# Patient Record
Sex: Female | Born: 1975
Health system: Southern US, Community
[De-identification: ages and names within clinical notes are randomized; demographics above are authoritative.]

## PROBLEM LIST (undated history)

## (undated) DIAGNOSIS — R51 Headache: Secondary | ICD-10-CM

## (undated) DIAGNOSIS — J45909 Unspecified asthma, uncomplicated: Secondary | ICD-10-CM

## (undated) DIAGNOSIS — N979 Female infertility, unspecified: Secondary | ICD-10-CM

## (undated) DIAGNOSIS — M069 Rheumatoid arthritis, unspecified: Secondary | ICD-10-CM

## (undated) DIAGNOSIS — R5382 Chronic fatigue, unspecified: Secondary | ICD-10-CM

## (undated) DIAGNOSIS — Z8619 Personal history of other infectious and parasitic diseases: Secondary | ICD-10-CM

## (undated) DIAGNOSIS — B379 Candidiasis, unspecified: Secondary | ICD-10-CM

## (undated) DIAGNOSIS — IMO0002 Reserved for concepts with insufficient information to code with codable children: Secondary | ICD-10-CM

## (undated) DIAGNOSIS — F329 Major depressive disorder, single episode, unspecified: Secondary | ICD-10-CM

## (undated) DIAGNOSIS — T4145XA Adverse effect of unspecified anesthetic, initial encounter: Secondary | ICD-10-CM

## (undated) DIAGNOSIS — I10 Essential (primary) hypertension: Secondary | ICD-10-CM

## (undated) DIAGNOSIS — R102 Pelvic and perineal pain: Secondary | ICD-10-CM

## (undated) DIAGNOSIS — Z8719 Personal history of other diseases of the digestive system: Secondary | ICD-10-CM

## (undated) DIAGNOSIS — D229 Melanocytic nevi, unspecified: Secondary | ICD-10-CM

## (undated) DIAGNOSIS — T8859XA Other complications of anesthesia, initial encounter: Secondary | ICD-10-CM

## (undated) DIAGNOSIS — J342 Deviated nasal septum: Secondary | ICD-10-CM

## (undated) DIAGNOSIS — Z8349 Family history of other endocrine, nutritional and metabolic diseases: Secondary | ICD-10-CM

## (undated) DIAGNOSIS — J189 Pneumonia, unspecified organism: Secondary | ICD-10-CM

## (undated) DIAGNOSIS — Z9889 Other specified postprocedural states: Secondary | ICD-10-CM

## (undated) DIAGNOSIS — I471 Supraventricular tachycardia, unspecified: Secondary | ICD-10-CM

## (undated) DIAGNOSIS — F32A Depression, unspecified: Secondary | ICD-10-CM

## (undated) DIAGNOSIS — N809 Endometriosis, unspecified: Secondary | ICD-10-CM

## (undated) DIAGNOSIS — R931 Abnormal findings on diagnostic imaging of heart and coronary circulation: Secondary | ICD-10-CM

## (undated) DIAGNOSIS — O165 Unspecified maternal hypertension, complicating the puerperium: Secondary | ICD-10-CM

## (undated) DIAGNOSIS — I493 Ventricular premature depolarization: Secondary | ICD-10-CM

## (undated) DIAGNOSIS — R011 Cardiac murmur, unspecified: Secondary | ICD-10-CM

## (undated) DIAGNOSIS — G9332 Myalgic encephalomyelitis/chronic fatigue syndrome: Secondary | ICD-10-CM

## (undated) DIAGNOSIS — K219 Gastro-esophageal reflux disease without esophagitis: Secondary | ICD-10-CM

## (undated) DIAGNOSIS — I359 Nonrheumatic aortic valve disorder, unspecified: Secondary | ICD-10-CM

## (undated) DIAGNOSIS — Z8489 Family history of other specified conditions: Secondary | ICD-10-CM

## (undated) DIAGNOSIS — R0789 Other chest pain: Secondary | ICD-10-CM

## (undated) DIAGNOSIS — R112 Nausea with vomiting, unspecified: Secondary | ICD-10-CM

## (undated) DIAGNOSIS — F419 Anxiety disorder, unspecified: Secondary | ICD-10-CM

## (undated) DIAGNOSIS — O139 Gestational [pregnancy-induced] hypertension without significant proteinuria, unspecified trimester: Secondary | ICD-10-CM

## (undated) DIAGNOSIS — N390 Urinary tract infection, site not specified: Secondary | ICD-10-CM

## (undated) DIAGNOSIS — K589 Irritable bowel syndrome without diarrhea: Secondary | ICD-10-CM

## (undated) HISTORY — DX: Personal history of other infectious and parasitic diseases: Z86.19

## (undated) HISTORY — DX: Urinary tract infection, site not specified: N39.0

## (undated) HISTORY — DX: Irritable bowel syndrome, unspecified: K58.9

## (undated) HISTORY — DX: Ventricular premature depolarization: I49.3

## (undated) HISTORY — DX: Candidiasis, unspecified: B37.9

## (undated) HISTORY — DX: Gastro-esophageal reflux disease without esophagitis: K21.9

## (undated) HISTORY — DX: Personal history of other diseases of the digestive system: Z87.19

## (undated) HISTORY — DX: Adverse effect of unspecified anesthetic, initial encounter: T41.45XA

## (undated) HISTORY — DX: Abnormal findings on diagnostic imaging of heart and coronary circulation: R93.1

## (undated) HISTORY — DX: Pelvic and perineal pain: R10.2

## (undated) HISTORY — DX: Depression, unspecified: F32.A

## (undated) HISTORY — DX: Melanocytic nevi, unspecified: D22.9

## (undated) HISTORY — DX: Endometriosis, unspecified: N80.9

## (undated) HISTORY — DX: Other chest pain: R07.89

## (undated) HISTORY — DX: Unspecified maternal hypertension, complicating the puerperium: O16.5

## (undated) HISTORY — DX: Gestational (pregnancy-induced) hypertension without significant proteinuria, unspecified trimester: O13.9

## (undated) HISTORY — DX: Essential (primary) hypertension: I10

## (undated) HISTORY — DX: Family history of other endocrine, nutritional and metabolic diseases: Z83.49

## (undated) HISTORY — DX: Supraventricular tachycardia, unspecified: I47.10

## (undated) HISTORY — DX: Reserved for concepts with insufficient information to code with codable children: IMO0002

## (undated) HISTORY — PX: FOOT SURGERY: SHX648

## (undated) HISTORY — DX: Supraventricular tachycardia: I47.1

## (undated) HISTORY — DX: Female infertility, unspecified: N97.9

## (undated) HISTORY — DX: Major depressive disorder, single episode, unspecified: F32.9

## (undated) HISTORY — PX: LAPAROSCOPIC ENDOMETRIOSIS FULGURATION: SUR769

## (undated) HISTORY — DX: Nonrheumatic aortic valve disorder, unspecified: I35.9

## (undated) HISTORY — PX: ABDOMINAL HYSTERECTOMY: SHX81

## (undated) HISTORY — DX: Deviated nasal septum: J34.2

## (undated) HISTORY — DX: Other complications of anesthesia, initial encounter: T88.59XA

---
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
## 1996-11-29 HISTORY — PX: BUNIONECTOMY: SHX129

## 1999-02-04 ENCOUNTER — Other Ambulatory Visit: Admission: RE | Admit: 1999-02-04 | Discharge: 1999-02-04 | Payer: Self-pay | Admitting: Obstetrics and Gynecology

## 1999-07-07 ENCOUNTER — Other Ambulatory Visit: Admission: RE | Admit: 1999-07-07 | Discharge: 1999-07-07 | Payer: Self-pay | Admitting: Obstetrics and Gynecology

## 1999-12-25 ENCOUNTER — Encounter: Payer: Self-pay | Admitting: Gastroenterology

## 1999-12-28 ENCOUNTER — Other Ambulatory Visit: Admission: RE | Admit: 1999-12-28 | Discharge: 1999-12-28 | Payer: Self-pay | Admitting: Internal Medicine

## 1999-12-28 ENCOUNTER — Encounter (INDEPENDENT_AMBULATORY_CARE_PROVIDER_SITE_OTHER): Payer: Self-pay | Admitting: Specialist

## 2000-01-18 ENCOUNTER — Other Ambulatory Visit: Admission: RE | Admit: 2000-01-18 | Discharge: 2000-01-18 | Payer: Self-pay | Admitting: Obstetrics and Gynecology

## 2000-03-23 ENCOUNTER — Encounter: Payer: Self-pay | Admitting: Family Medicine

## 2000-03-23 ENCOUNTER — Ambulatory Visit (HOSPITAL_COMMUNITY): Admission: RE | Admit: 2000-03-23 | Discharge: 2000-03-23 | Payer: Self-pay | Admitting: Family Medicine

## 2000-08-05 ENCOUNTER — Encounter: Payer: Self-pay | Admitting: *Deleted

## 2000-08-05 ENCOUNTER — Ambulatory Visit (HOSPITAL_COMMUNITY): Admission: RE | Admit: 2000-08-05 | Discharge: 2000-08-05 | Payer: Self-pay | Admitting: *Deleted

## 2001-02-21 ENCOUNTER — Other Ambulatory Visit: Admission: RE | Admit: 2001-02-21 | Discharge: 2001-02-21 | Payer: Self-pay | Admitting: Obstetrics and Gynecology

## 2002-02-28 ENCOUNTER — Other Ambulatory Visit: Admission: RE | Admit: 2002-02-28 | Discharge: 2002-02-28 | Payer: Self-pay | Admitting: Obstetrics and Gynecology

## 2002-11-02 ENCOUNTER — Encounter: Payer: Self-pay | Admitting: *Deleted

## 2002-11-02 ENCOUNTER — Ambulatory Visit (HOSPITAL_COMMUNITY): Admission: RE | Admit: 2002-11-02 | Discharge: 2002-11-02 | Payer: Self-pay | Admitting: Internal Medicine

## 2003-03-07 ENCOUNTER — Other Ambulatory Visit: Admission: RE | Admit: 2003-03-07 | Discharge: 2003-03-07 | Payer: Self-pay | Admitting: Obstetrics and Gynecology

## 2003-12-24 DIAGNOSIS — Z8742 Personal history of other diseases of the female genital tract: Secondary | ICD-10-CM | POA: Insufficient documentation

## 2004-01-06 ENCOUNTER — Ambulatory Visit (HOSPITAL_COMMUNITY): Admission: RE | Admit: 2004-01-06 | Discharge: 2004-01-06 | Payer: Self-pay | Admitting: Obstetrics and Gynecology

## 2004-03-09 ENCOUNTER — Other Ambulatory Visit: Admission: RE | Admit: 2004-03-09 | Discharge: 2004-03-09 | Payer: Self-pay | Admitting: Obstetrics and Gynecology

## 2004-05-02 ENCOUNTER — Inpatient Hospital Stay (HOSPITAL_COMMUNITY): Admission: RE | Admit: 2004-05-02 | Discharge: 2004-05-02 | Payer: Self-pay | Admitting: Obstetrics and Gynecology

## 2004-08-24 ENCOUNTER — Ambulatory Visit (HOSPITAL_COMMUNITY): Admission: RE | Admit: 2004-08-24 | Discharge: 2004-08-24 | Payer: Self-pay | Admitting: Obstetrics and Gynecology

## 2005-01-16 ENCOUNTER — Inpatient Hospital Stay (HOSPITAL_COMMUNITY): Admission: AD | Admit: 2005-01-16 | Discharge: 2005-01-19 | Payer: Self-pay | Admitting: Obstetrics and Gynecology

## 2005-01-20 ENCOUNTER — Encounter: Admission: RE | Admit: 2005-01-20 | Discharge: 2005-02-18 | Payer: Self-pay | Admitting: Obstetrics and Gynecology

## 2005-03-01 ENCOUNTER — Other Ambulatory Visit: Admission: RE | Admit: 2005-03-01 | Discharge: 2005-03-01 | Payer: Self-pay | Admitting: Obstetrics and Gynecology

## 2006-02-07 ENCOUNTER — Ambulatory Visit (HOSPITAL_COMMUNITY): Admission: RE | Admit: 2006-02-07 | Discharge: 2006-02-07 | Payer: Self-pay | Admitting: Obstetrics and Gynecology

## 2006-04-04 ENCOUNTER — Other Ambulatory Visit: Admission: RE | Admit: 2006-04-04 | Discharge: 2006-04-04 | Payer: Self-pay | Admitting: Obstetrics and Gynecology

## 2006-08-05 ENCOUNTER — Encounter: Admission: RE | Admit: 2006-08-05 | Discharge: 2006-08-05 | Payer: Self-pay | Admitting: Family Medicine

## 2006-08-26 ENCOUNTER — Encounter: Admission: RE | Admit: 2006-08-26 | Discharge: 2006-08-26 | Payer: Self-pay | Admitting: Family Medicine

## 2006-10-06 ENCOUNTER — Encounter: Admission: RE | Admit: 2006-10-06 | Discharge: 2006-10-06 | Payer: Self-pay | Admitting: Family Medicine

## 2006-11-29 DIAGNOSIS — IMO0002 Reserved for concepts with insufficient information to code with codable children: Secondary | ICD-10-CM

## 2006-11-29 HISTORY — PX: OTHER SURGICAL HISTORY: SHX169

## 2006-11-29 HISTORY — DX: Reserved for concepts with insufficient information to code with codable children: IMO0002

## 2007-06-20 ENCOUNTER — Ambulatory Visit (HOSPITAL_COMMUNITY): Admission: RE | Admit: 2007-06-20 | Discharge: 2007-06-21 | Payer: Self-pay | Admitting: Specialist

## 2007-10-20 ENCOUNTER — Ambulatory Visit (HOSPITAL_COMMUNITY): Admission: RE | Admit: 2007-10-20 | Discharge: 2007-10-20 | Payer: Self-pay | Admitting: Family Medicine

## 2008-05-27 ENCOUNTER — Telehealth: Payer: Self-pay | Admitting: Gastroenterology

## 2008-05-29 DIAGNOSIS — G43909 Migraine, unspecified, not intractable, without status migrainosus: Secondary | ICD-10-CM | POA: Insufficient documentation

## 2008-05-29 DIAGNOSIS — K589 Irritable bowel syndrome without diarrhea: Secondary | ICD-10-CM | POA: Insufficient documentation

## 2008-05-29 DIAGNOSIS — R1013 Epigastric pain: Secondary | ICD-10-CM | POA: Insufficient documentation

## 2008-05-29 DIAGNOSIS — K5900 Constipation, unspecified: Secondary | ICD-10-CM | POA: Insufficient documentation

## 2008-05-29 DIAGNOSIS — K219 Gastro-esophageal reflux disease without esophagitis: Secondary | ICD-10-CM | POA: Insufficient documentation

## 2008-05-30 ENCOUNTER — Ambulatory Visit: Payer: Self-pay | Admitting: Gastroenterology

## 2008-05-30 DIAGNOSIS — F3289 Other specified depressive episodes: Secondary | ICD-10-CM | POA: Insufficient documentation

## 2008-05-30 DIAGNOSIS — R1310 Dysphagia, unspecified: Secondary | ICD-10-CM | POA: Insufficient documentation

## 2008-05-30 DIAGNOSIS — I059 Rheumatic mitral valve disease, unspecified: Secondary | ICD-10-CM | POA: Insufficient documentation

## 2008-05-30 DIAGNOSIS — F329 Major depressive disorder, single episode, unspecified: Secondary | ICD-10-CM | POA: Insufficient documentation

## 2008-05-30 DIAGNOSIS — K602 Anal fissure, unspecified: Secondary | ICD-10-CM | POA: Insufficient documentation

## 2008-05-30 DIAGNOSIS — Q231 Congenital insufficiency of aortic valve: Secondary | ICD-10-CM | POA: Insufficient documentation

## 2008-05-30 LAB — CONVERTED CEMR LAB
ALT: 14 units/L (ref 0–35)
AST: 16 units/L (ref 0–37)
Albumin: 3.7 g/dL (ref 3.5–5.2)
Alkaline Phosphatase: 40 units/L (ref 39–117)
Chloride: 108 meq/L (ref 96–112)
Eosinophils Absolute: 0 10*3/uL (ref 0.0–0.7)
Eosinophils Relative: 0.8 % (ref 0.0–5.0)
GFR calc non Af Amer: 90 mL/min
HCT: 37.5 % (ref 36.0–46.0)
Hemoglobin: 13.2 g/dL (ref 12.0–15.0)
Lymphocytes Relative: 44.5 % (ref 12.0–46.0)
Monocytes Absolute: 0.4 10*3/uL (ref 0.1–1.0)
Platelets: 268 10*3/uL (ref 150–400)
Sodium: 139 meq/L (ref 135–145)
TSH: 1.32 microintl units/mL (ref 0.35–5.50)
Tissue Transglutaminase Ab, IgA: 0.2 units (ref <7)
Total Protein: 6.1 g/dL (ref 6.0–8.3)

## 2008-06-11 ENCOUNTER — Telehealth: Payer: Self-pay | Admitting: Gastroenterology

## 2008-06-14 ENCOUNTER — Encounter: Payer: Self-pay | Admitting: Gastroenterology

## 2008-06-14 ENCOUNTER — Ambulatory Visit: Payer: Self-pay | Admitting: Gastroenterology

## 2008-06-19 ENCOUNTER — Encounter: Payer: Self-pay | Admitting: Gastroenterology

## 2008-06-19 ENCOUNTER — Telehealth: Payer: Self-pay | Admitting: Gastroenterology

## 2008-07-08 ENCOUNTER — Ambulatory Visit: Payer: Self-pay | Admitting: Gastroenterology

## 2008-07-19 ENCOUNTER — Telehealth: Payer: Self-pay | Admitting: Gastroenterology

## 2008-11-29 HISTORY — PX: OTHER SURGICAL HISTORY: SHX169

## 2008-12-19 ENCOUNTER — Ambulatory Visit (HOSPITAL_COMMUNITY): Admission: RE | Admit: 2008-12-19 | Discharge: 2008-12-19 | Payer: Self-pay | Admitting: Obstetrics and Gynecology

## 2008-12-19 ENCOUNTER — Encounter (INDEPENDENT_AMBULATORY_CARE_PROVIDER_SITE_OTHER): Payer: Self-pay | Admitting: Obstetrics and Gynecology

## 2009-01-10 ENCOUNTER — Telehealth: Payer: Self-pay | Admitting: Gastroenterology

## 2009-10-24 ENCOUNTER — Inpatient Hospital Stay (HOSPITAL_COMMUNITY): Admission: AD | Admit: 2009-10-24 | Discharge: 2009-10-24 | Payer: Self-pay | Admitting: Obstetrics and Gynecology

## 2009-10-26 ENCOUNTER — Inpatient Hospital Stay (HOSPITAL_COMMUNITY): Admission: AD | Admit: 2009-10-26 | Discharge: 2009-10-28 | Payer: Self-pay | Admitting: Obstetrics and Gynecology

## 2010-12-20 ENCOUNTER — Encounter: Payer: Self-pay | Admitting: Obstetrics and Gynecology

## 2010-12-29 LAB — CBC
MCH: 28.3 pg (ref 26.0–34.0)
MCHC: 33.4 g/dL (ref 30.0–36.0)
MCV: 84.6 fL (ref 78.0–100.0)
Platelets: 321 10*3/uL (ref 150–400)
RBC: 4.56 MIL/uL (ref 3.87–5.11)
RDW: 13.2 % (ref 11.5–15.5)
WBC: 7.3 10*3/uL (ref 4.0–10.5)

## 2010-12-31 ENCOUNTER — Ambulatory Visit (HOSPITAL_BASED_OUTPATIENT_CLINIC_OR_DEPARTMENT_OTHER)
Admission: RE | Admit: 2010-12-31 | Discharge: 2010-12-31 | Disposition: A | Discharge status: Home / Self Care | Payer: Managed Care, Other (non HMO) | Attending: Obstetrics and Gynecology | Admitting: Obstetrics and Gynecology

## 2010-12-31 ENCOUNTER — Other Ambulatory Visit: Payer: Self-pay | Admitting: Obstetrics and Gynecology

## 2010-12-31 DIAGNOSIS — N938 Other specified abnormal uterine and vaginal bleeding: Secondary | ICD-10-CM | POA: Insufficient documentation

## 2010-12-31 DIAGNOSIS — N949 Unspecified condition associated with female genital organs and menstrual cycle: Secondary | ICD-10-CM | POA: Insufficient documentation

## 2010-12-31 DIAGNOSIS — N841 Polyp of cervix uteri: Secondary | ICD-10-CM | POA: Insufficient documentation

## 2010-12-31 HISTORY — PX: DILATION AND CURETTAGE OF UTERUS: SHX78

## 2011-01-08 NOTE — Op Note (Signed)
  NAME:  ACASIA, SKILTON NO.:  [id number]  MEDICAL RECORD NO.:  [id number]          PATIENT TYPE:  AMB  LOCATION:  [facility]                         FACILITY:  [facility]  PHYSICIAN:  Crist Fat. Mozel Burdett, M.D. DATE OF BIRTH:  07-30-1976  DATE OF PROCEDURE:  12/31/2010 DATE OF DISCHARGE:                              OPERATIVE REPORT   PREOPERATIVE DIAGNOSIS:  Dysfunctional uterine bleeding with endometrial polyp.  POSTOPERATIVE DIAGNOSIS:  Dysfunctional uterine bleeding with endocervical polyps.  ANESTHESIA:  General, Dr. Rica Mast  PROCEDURE:  Hysteroscopy, resection of endocervical polyps, dilatation and curettage.  SURGEON:  Crist Fat. Meilyn Heindl, M.D.  ASSISTANT:  None.  ESTIMATED BLOOD LOSS:  Minimal.  DESCRIPTION OF PROCEDURE:  After being informed of the planned procedure with possible complications including bleeding, infection, injury to uterus, informed consent was obtained.  The patient was taken to OR #1, given general anesthesia with laryngeal mask ventilation without any complication.  She was placed in the lithotomy position, prepped and draped in a sterile fashion.  Her bladder was emptied with an in-and-out red rubber catheter, and she had knee-high sequential compressive devices.  Pelvic exam revealed an anteverted uterus, normal in size and shape, 2 normal adnexa.  A weighted speculum was inserted in the vagina. Anterior lip of cervix was grasped with a tenaculum and forceps, and we proceeded with a paracervical block using Novocain 1% 18 cc in the usual fashion.  The uterus was then sounded at 8 cm and the cervix was easily dilated using Hegar dilator until #33 which allowed easy entry of an operative hysteroscope to visualize the entire uterine cavity under glycine perfusion at a maximum pressure of 80 mmHg.  The uterine cavity was completely atrophic with 2 tubal ostia easily visualized.  No endometrial polyp was identified.  As we retrieved the  hysteroscope, we did see 2 small endocervical polyps right at the junction of the endocervix and the lower uterine segment and these were easily resected and sent separately.  Hysteroscope was then removed and a sharp curette was used to do a gentle D and C which removed very little tissue of normal appearance.  All instruments were then removed.  Instruments and sponge count was complete x2.  Estimated blood loss was minimal and water deficit was negligible.  The procedure was very well tolerated by the patient who was taken to recovery room in a well and stable condition.  SPECIMEN:  Endometrial curettings and endocervical polyps sent to Pathology.     Crist Fat Larkin Morelos, M.D.     SAR/MEDQ  D:  12/31/2010  T:  12/31/2010  Job:  [id number]  Electronically Signed by Silverio Lay M.D. on 01/08/2011 03:10:01 PM

## 2011-03-03 LAB — WET PREP, GENITAL: Trich, Wet Prep: NONE SEEN

## 2011-03-03 LAB — CBC
Hemoglobin: 11.6 g/dL — ABNORMAL LOW (ref 12.0–15.0)
MCHC: 33.3 g/dL (ref 30.0–36.0)
RBC: 4.52 MIL/uL (ref 3.87–5.11)
RDW: 14.4 % (ref 11.5–15.5)

## 2011-03-03 LAB — RPR: RPR Ser Ql: NONREACTIVE

## 2011-03-15 LAB — CBC
MCHC: 33.8 g/dL (ref 30.0–36.0)
MCV: 86.1 fL (ref 78.0–100.0)
RBC: 4.73 MIL/uL (ref 3.87–5.11)
RDW: 13 % (ref 11.5–15.5)
WBC: 6.4 10*3/uL (ref 4.0–10.5)

## 2011-04-13 NOTE — H&P (Signed)
NAME:  Madeline Everett, Madeline Everett NO.:  [id number]   MEDICAL RECORD NO.:  [id number]          PATIENT TYPE:  AMB   LOCATION:  [facility]                           FACILITY:  [facility]   PHYSICIAN:  Crist Fat. Rivard, M.D. DATE OF BIRTH:  October 16, 1976   DATE OF ADMISSION:  DATE OF DISCHARGE:                              HISTORY & PHYSICAL   HISTORY OF PRESENT ILLNESS:  Ms. Orzechowski is a 35 year old married white  female, para 1-0-1-1 who presents for laparoscopic ovarian cystectomy  with the possibility of resection of endometriosis and  chromopertubation.  The patient has had a long waxing and waning history  of pelvic pain, which primarily was located on her left and described as  achy and nagging.  This pain which oftentimes would be constant would  range from a 1/10 to a 7/10 on a 10-point pain scale.  Over the past  year, however, the patient has been totally pain free until August 2009,  when she developed primarily right lower quadrant pain, which persisted  for approximately 3 weeks.  This pain was described as sharp and achy  and at times would take away her breath.  She denied any nausea,  vomiting, diarrhea, any fever, any vaginitis symptoms, any back pain, or  fever.  At that time, she was seen by her primary physician, Dr. Laurann Montana, who ordered a CT scan of the abdomen and pelvis.  The results of  the CT scan showed a left ovarian cyst with features consistent with a  dermoid measuring 3.5 cm.  The remainder of that study was within normal  range.  A followup pelvic ultrasound in October 2009 showed that the  left ovarian cyst had enlarged to 4.2 cm and continued to show features  of a dermoid.  The patient admits to positional dyspareunia, increased  pain with prolonged activity, but denies any dysuria, hematuria, or  urinary frequency.  Given the findings on her CT scan/ultrasound and the  patient's persistent pain, she has decided to proceed with a left  ovarian  cystectomy using a laparoscopic approach.   PAST MEDICAL HISTORY:  OB history:  Gravida 2, para 1-0-1-1.  The  patient had cesarean section in 2000 at 34 weeks due to pregnancy-  induced hypertension (that infant later died).  She had a spontaneous  vaginal birth in 2006, during which time, she experienced a second  degree laceration.   GYN history:  Menarche 35 years old, last menstrual period 3 months ago.  The patient is using continuous Loestrin 24, oral contraceptives.  She  denies any history of sexually transmitted diseases, though she does  have a history of pelvic inflammatory disease, had an abnormal Pap smear  in 2000, but they have been normal since that time with the most recent  being normal in April 2009.   Medical history:  Gastroesophageal reflux disease, infertility,  depression, migraines, herniated disk at L5, irritable bowel syndrome,  bicuspid aortic valve, colon polyps, supraventricular tachycardia, and  mitral valve prolapse requiring antibiotic prophylaxis.   SURGICAL HISTORY:  1994 tonsillectomy; 1998 laparoscopy revealing  endometriosis;  2000 laparoscopy for pelvic pain, ablation of  endometriosis; 2005 laparoscopy with ablation of endometriosis; 2007  laparoscopy with ablation of endometriosis; 1999 foot surgery; 2000  lumbar disk surgery; and 2006 perineoplasty.  The patient denies any  history of blood transfusions, though she does state that ANESTHESIA  causes her severe nausea and vomiting and she is very slow to wake up  from it.   FAMILY HISTORY:  Cardiovascular disease, asthma, hypertension, diabetes  mellitus, migraines, and stroke.   HABITS:  She does not use tobacco.  She occasionally consumes alcohol.  She does not use any illicit drugs.   SOCIAL HISTORY:  The patient is married and she works as a Youth worker.   CURRENT MEDICATIONS:  1. Relpax 40 mg as needed.  2. Kapidex 60 mg daily.  3. Loestrin 24 Fe 1  tablet daily.  4. Wellbutrin SR 150 mg twice daily.  5. Allegra 180 mg daily.   The patient has no known drug allergies and denies any allergies to  latex or shellfish; however, she does admit to being LACTOSE intolerant.   REVIEW OF SYSTEMS:  The patient does wear glasses.  She denies any chest  pain, shortness of breath, palpitations, cough, headache, vision  changes, tinnitus, skin rashes, and except as is mentioned in history of  present illness, the patient's review of systems is otherwise negative.   PHYSICAL EXAMINATION:  VITAL SIGNS:  Blood pressure is 130/80, pulse is  90, respirations 18, weight 147, height 5 feet 4-1/2 inches tall, body  mass index 24.8, temperature 97.9 degrees Fahrenheit orally.  NECK:  Supple without masses.  There is no thyromegaly or cervical  adenopathy.  HEART:  Regular rate and rhythm.  There is a midsystolic click, but no  audible murmur.  LUNGS:  Clear.  BACK:  No CVA tenderness.  ABDOMEN:  No tenderness, masses, or organomegaly.  EXTREMITIES:  No clubbing, cyanosis, or edema.  PELVIC:  EGBUS is within normal limits.  Vagina is rugous.  Cervix is  nontender without lesions.  Uterus appears normal size, shape, and  consistency without tenderness.  Adnexa without any palpable masses or  tenderness.   IMPRESSION:  1. Left ovarian dermoid cyst.  2. Endometriosis.  3. Desire to conceive.   DISPOSITION:  A discussion was held with the patient regarding the  indication for her procedures along with their risks, which include but  are not limited to reaction to anesthesia, damage to adjacent organs,  infection, excessive bleeding, and the possibility of a left  oophorectomy.  The patient verbalized understanding of these risk and  wish to proceed with a laparoscopic left ovarian cystectomy with the  possibility of resection of endometriosis and chromopertubation at  [facility] [place] on December 19, 2008, at 12:30 p.m.      [place]mira  J. Adline Peals.      Crist Fat Rivard, M.D.  Electronically Signed    EJP/MEDQ  D:  12/09/2008  T:  12/10/2008  Job:  [id number]

## 2011-04-13 NOTE — Op Note (Signed)
NAME:  Madeline Everett, Madeline Everett NO.:  [id number]   MEDICAL RECORD NO.:  [id number]          PATIENT TYPE:  AMB   LOCATION:  [facility]                           FACILITY:  [facility]   PHYSICIAN:  Crist Fat. Rivard, M.D. DATE OF BIRTH:  22-Jun-1976   DATE OF PROCEDURE:  12/19/2008  DATE OF DISCHARGE:                               OPERATIVE REPORT   PREOPERATIVE DIAGNOSES:  1. Left ovarian dermoid cyst.  2. Endometriosis.   POSTOPERATIVE DIAGNOSES:  1. Left ovarian dermoid cyst.  2. Endometriosis.   ANESTHESIA:  General by Octaviano Glow. Pamalee Leyden, MD   PROCEDURES:  Laparoscopy with left ovarian cystectomy,  chromopertubation, and cauterization of endometriosis.   SURGEON:  Crist Fat. Rivard, MD   ASSISTANT:  Elmira J. Lowell Guitar, PA   ESTIMATED BLOOD LOSS:  Minimal.   PROCEDURE IN DETAIL:  After being informed of the planned procedure with  possible complications including bleeding; infection; injury to bowel,  bladder, or ureters; need for laparotomy; need for oophorectomy,  informed consent is obtained.  The patient is taken to OR #3, given  general anesthesia with endotracheal intubation without any  complication.  She is placed in a lithotomy position, prepped, and  draped in a sterile fashion.  A Foley catheter is inserted in her  bladder.  Pelvic exam reveals an anteverted uterus, a right normal  adnexa, left increased size adnexa.  A speculum is inserted, anterior  lip of the cervix is grasped with a tenaculum, and acorn manipulator is  placed without difficulty.   We infiltrated the umbilical area with Marcaine 0.25, perform a 5-mm  incision, insert a Veress needle and insufflate a pneumoperitoneum with  CO2 at a maximum pressure of 15 mmHg.  A 5-mm trocar is then inserted  with a 5-mm laparoscope.   Observation:  Anterior cul-de-sac is normal except for fine grade 1  adhesions between the bladder flap and the lower uterine segment from a  previous cesarean section.  Uterus is  normal.  Right tube, right ovary  are normal.  Left tube is normal, left ovary has a 4.5-cm dermoid cyst  with a smooth capsule.  No nodularity and it is mobile.  We do see in  the posterior cul-de-sac, a pseudo window as well as approximately seven  1-2 mm implants of endometriosis.  Appendix is visualized and normal.  Liver is visualized and normal.  The stomach is distended and we request  from anesthesia to put an NG tube during surgery.   We infiltrate the suprapubic area with Marcaine 0.25, perform a 5-mm  incision, insert a 5-mm trocar under direct visualization, and we place  a 5-mm left lower quadrant trocar under direct visualization after  infiltrating with Marcaine 0.25.   The left utero-ovarian ligament is grasped and with a spinal needle, we  infiltrate the capsule of the cyst using vasopressin 20 units in 50 mL.  Using hot scissors, we then incise that capsule on a distance of 1.5 cm  and there is minimal spillage of sebaceous material as well as hair, but  it is otherwise under control.  We  are able to rapidly locate the cyst  wall, grab it, and grab the wall of the ovary and with traction and  countertraction, we peeled the cyst completely off the ovary.  The 5-mm  suprapubic trocar is removed.  We extend the incision to a 10-mm and a  10-mm trocar is inserted under direct visualization allowing [place] to drop  an Endobag and put the specimen in, which is removed through the  incision.  The 10-mm trocar is reinserted and we proceed with profuse  irrigation of warm saline and suctioned of debris and water.  The left  ovary is regrasped and it is irrigated with warm saline and 3 sites of  bleeding are controlled using bipolar cauterization.  We again irrigate  profusely and notice satisfactory hemostasis.  All endometriotic  implants between the 2 uterosacral ligaments are cauterized including  the pseudo window.  We then proceed with chromopertubation and note a  very easy  filling of both tubes and easy spillage of both tubes.   We irrigate profusely with warm saline and again note satisfactory  hemostasis and no debris from the dermoid cyst are noted.  All trocars  are then removed after evacuating the pneumoperitoneum.  The fascia of  the suprapubic incision is closed with a running suture of 0 Vicryl and  the skin is closed with a subcuticular suture of 3-0 Monocryl.  All  incisions are then covered with Dermabond.   Instrument and sponge count is complete x2.  Estimated blood loss is  minimal.  The procedure is very well tolerated by the patient who is  taken to recovery room in a well and stable condition.   SPECIMEN:  Left ovarian cyst sent to Pathology.      Crist Fat Rivard, M.D.  Electronically Signed     SAR/MEDQ  D:  12/19/2008  T:  12/20/2008  Job:  [id number]

## 2011-04-13 NOTE — Op Note (Signed)
NAME:  Madeline Everett, Madeline Everett               ACCOUNT NO.:  [id number]   MEDICAL RECORD NO.:  [id number]          PATIENT TYPE:  AMB   LOCATION:  [facility]                          FACILITY:  [facility]   PHYSICIAN:  Kerrin Champagne, M.D.   DATE OF BIRTH:  1976-05-22   DATE OF PROCEDURE:  06/20/2007  DATE OF DISCHARGE:                               OPERATIVE REPORT   PREOPERATIVE DIAGNOSIS:  Large herniated nucleus pulposus left L5-S1  unresponsive to conservative management for well over eight months.   POSTOPERATIVE DIAGNOSIS:  Large herniated nucleus pulposus left L5-S1  unresponsive to conservative management for well over eight months.   PROCEDURE:  Left L5-S1 microdiskectomy using MIS system and microscope.   SURGEON:  Kerrin Champagne, M.D.   ASSISTANT:  Wende Neighbors, P.A.   ANESTHESIA:  General via oral tracheal intubation, Dr. Michelle Piper,  supplemented with local infiltration of Marcaine 0.5% with 1:200,000  epinephrine 26 mL.   SPECIMENS:  Herniated disk material not sent for pathology.   ESTIMATED BLOOD LOSS:  10 mL.   COMPLICATIONS:  None.   The patient returned to the PACU in good condition.   HISTORY OF PRESENT ILLNESS:  The patient is a 35 year old female who  sustained a back injury nearly a year ago.  She was treated  conservatively and had improvement of her symptoms and discomfort over  the past 3-4 months, however, her pain has recurred with severe  radiation in the left S1 distribution.  She has been incapacitated in  terms of her activities of daily living, unable to perform any bending,  stooping or lifting.  She underwent a repeat MRI scan and it shows a  large recurrent disk herniation with progression of disk herniation  material, extrusion at the L5-S1 level on the left side left  paracentral.  There is nerve compression involving the left S1 nerve  root.   INTRAOPERATIVE FINDINGS:  The patient had a large disk herniation left  L5-S1.  At least three large free  fragments were resected.  The S1 nerve  root decompressed.  There was a large vein retracting the nerve root  into the lateral recess preventing it from decompressing.   DESCRIPTION OF PROCEDURE:  After adequate general anesthesia, the  patient in a prone position and Wilson frame, the reverse Gitron was  used.  She had all pressure points well padded.  TED stockings thigh  high both legs to try and prevent deep venous thrombosis.  Standard  preoperative antibiotics of Ancef.  Standard prep with DuraPrep solution  from the mid dorsal spine to the sacrum.  Draped in the usual manner,  iodine body drape was used.  The inspected left sided L5-S1 level had  been marked preoperatively.  This area skin marked at the expected L5-S1  level.  An 18 gauge spinal needle placed.  The needle under fluoro  observed to be just correct caudal to the L5-S1 levels.  The incision  was buried superiorly from the spinal needle.  The skin was infiltrated  with Marcaine 0.5% with 1:200,000 epinephrine at approximately 10 mL.  Then a  3/4 to 1-inch incision was made in the midline at the expected L5-  S1 level.  Electrocautery used to control all subcutaneous bleeders and  then dissection carried down to the lumbodorsal fascia.  The incision  then made on the left side of the expected L5-S1 level, then Cobb used  to carefully elevate the paralumbar muscles from the midline to the left  side, palpating the L5-S1 interval posteriorly.  At this point, then  dilators were then placed using the smallest, then up to the largest.  Fluoro was then brought into the field and observed to have the two tube  docked at the posterior aspect of the L5-S1 interval.  The 15-mm blades  were chosen.  These were then placed over the tube, 7 mm tube passed  down to the L5-S1 interspace on the left side.  Retraction of the blades  were then dilated on the deep portion of the blades and endotraction  obtained.  The retracting system  carefully then fixed to the outrigger  sterilely.  Loop magnification and head light were used during this  portion of the procedure.  The attachments to the paralumbar muscles to  the inferior aspect of the lamina at L5 on the left side were then  carefully debrided using both electrocautery as well as a 4-mm Kerrison.  A Penfield 4 was then placed over the medial aspect of the L5-S1 facet  on the left side and C-arm fluoro used to ascertain the position  alignment.  This verified the level at the L5-S1 level.  Permanent  images obtained for documentation purposes.   At this point, then the 3-mm Kerrison was able to be introduced over the  superior aspect of the S1 lamina, carefully resecting the attachment of  the ligamentum flavum off the superior aspect of the S1 lamina. This was  carried both medial and then lateral resecting the flavum then off of  the medial aspect of the L5-S1 facet, continued superiorly to the  attachment of the inferior ventral surface of the inferior aspect of the  L5 lamina.  Abundant fat found within the spinal canal.  This was then  carefully retracted.  The microscope was then sterilely draped, brought  into the field.  Under the operating room microscope, then the lateral  aspect of the thecal sac and the S1 nerve root were identified.  These  were retracted medially.  A small vein tethering the S1 nerve root in to  the lateral recess was carefully cauterized using bipolar electrocautery  and then divided with a 10 blade scalpel allowing for retraction of the  sac and the S1 nerve root with much more ease.  Disk herniation noted to  be prominent directly posterior to the disk space at the L5-S1 level.  A  15 blade scalpel was used to make a vertical incision in the posterior  longitudinal ligament and disk material immediately extruded.  A nerve  hook was used to carefully free up the disk material, delivering it into  the open wound on the left side of  the L5-S1 posterior longitudinal  ligament.  Micro pituitary rongeurs and then regular pituitary  additionally used to remove large free fragments then at this point from  the subligamentous position.  At least three large fragments were  resected decompressing the spinal canal quite nicely.  Micro pituitary  introduced into the disk space and additional fragmented disk material  was resected from the L5-S1 disk space.  The pituitary with teeth  were  also used to further debride the intervertebral disk space and an loose  disk material.  Then this spinal canal was felt to be completely  decompressed.  A ball tipped nerve hook was then able to be passed out  the S1 neuroforamen without difficulty over the posterior aspect of disk  space demonstrating no further disk material remaining.  Irrigation was  performed.  There was no active bleeding present.  Thrombin soaked  Gelfoam was used for some careful hemostasis of a vein over the  posterior aspect of the thecal sac.  This performed quite nicely and it  was removed at the end of the case.  Irrigation was performed.  There  was no further active bleeding present at this point.  The patient had  removal of the retracting system.  The lumbodorsal fascia was then  reapproximated midline with interrupted 0 Vicryl sutures using a UR6  needle.  The deep subcutaneous layers approximated with interrupted 0  Vicryl sutures, and then 2-0 Vicryl sutures.  The skin closed with a  running  subcutaneous stitch of 4-0 Vicryls.  The skin was then closed with  Dermabond, 4 x 4's affixed to the skin with Hypafix tape.  The patient  was then reactivated, extubated and returned to the recovery room  following return to a supine position in satisfactory condition.      Kerrin Champagne, M.D.  Electronically Signed     JEN/MEDQ  D:  06/20/2007  T:  06/20/2007  Job:  [id number]

## 2011-04-16 NOTE — Op Note (Signed)
NAME:  Madeline Everett, Madeline Everett NO.:  [id number]   MEDICAL RECORD NO.:  [id number]          PATIENT TYPE:  AMB   LOCATION:  SDC                           [facility]:  [facility]   PHYSICIAN:  Crist Fat. Rivard, M.D. DATE OF BIRTH:  20-Sep-1976   DATE OF PROCEDURE:  02/07/2006  DATE OF DISCHARGE:                                 OPERATIVE REPORT   PREOPERATIVE DIAGNOSIS:  Pelvic pain.   POSTOP DIAGNOSIS:  Pelvic pain. With endometriosis and pelvic adhesions.   ANESTHESIA:  General Dr. Pamalee Leyden.   PROCEDURE:  Laparoscopy with lysis of adhesions, cauterization of  endometriosis.   SURGEON:  Dr. Estanislado Pandy.   ESTIMATED BLOOD LOSS:  Minimal.   PROCEDURE:  After being informed of the planned procedure with possible  complications including but not limited to bleeding, infection, injury to  bowel, bladder or ureters, need for laparotomy, informed consent is  obtained. The patient is taken to OR #4, given general anesthesia with  endotracheal intubation without complication. She is placed in the lithotomy  position, prepped and draped in a sterile fashion. A Foley catheter is  inserted in her bladder. Pelvic exam reveals an anteverted uterus, normal in  size and shape, two normal adnexa. A speculum is inserted. Anterior lip of  the cervix was grasped with a tenaculum forceps and an acorn manipulator is  placed in the uterus.   We proceed with infiltration of the umbilical area using 5 mL of Marcaine  0.25 and perform a semi elliptical incision which allows easy entry of the  Veress needle for insufflation of pneumoperitoneum with the CO2 at a maximum  pressure of 15 mmHg. A 5 mm trocar is then placed suprapubically overlooking  the previous Pfannenstiel incision after infiltration with 2 mL of Marcaine  0.25 under direct visualization.   OBSERVATION:  Anterior cul-de-sac reveals a small amount of adhesion between  the anterior deflection of the uterus and the bladder which is believed  to  be post cesarean section. No lesion of endometriosis was seen in the  anterior cul-de-sac. The uterus is normal. Posterior cul-de-sac reveals  multiple scattered clear lesion midline and clear and bluish lesions on the  left uterosacral ligament. All varying between 1 and 2 mm of diameter. The  right uterosacral ligament is normal. Right tube right ovary normal. Left  tube left ovary normal. In the left ovarian fossa on the left pelvic wall is  three lesions of bluish lesion of endometriosis on the peritoneum. Appendix  was visualized and normal. Liver is visualized and normal. There are some  adhesions, fine grade 1 and two between the colon and the left pelvic wall  and also between the colon and the left adnexa. We proceed with  cauterization of all the previously described endometriotic lesions which is  facilitated by the fact that the uterus is easily seen and is easily kept  under direct visualization throughout the procedure. Using sharp dissection  we then released all these adhesions so that the colon is released  completely. Some of them have to be cauterized superficially. We then  irrigate profusely  with warm saline and we note a satisfactory hemostasis, a  normal left ureter with normal peristalsis away from our areas of  cauterization.   Instruments were then removed after evacuating the pneumoperitoneum.   The fascia of 10 mm incision is closed with a simple suture of 0 Vicryl  under direct visualization and both incisions were then closed with  subcuticular suture of 4-0 chromic.   Instrument and sponge count is complete x2. Estimated blood loss is minimal.  Procedure is well tolerated by the patient who is taken to recovery room in  a well and stable condition.      Crist Fat Rivard, M.D.  Electronically Signed     SAR/MEDQ  D:  02/07/2006  T:  02/08/2006  Job:  [id number]

## 2011-04-16 NOTE — H&P (Signed)
NAME:  Madeline Everett, Madeline Everett NO.:  [id number]   MEDICAL RECORD NO.:  [id number]          PATIENT TYPE:  INP   LOCATION:  9144                          [facility]ILITY:  [facility]   PHYSICIAN:  Hal Morales, M.D.DATE OF BIRTH:  1976/02/26   DATE OF ADMISSION:  01/16/2005  DATE OF DISCHARGE:                                HISTORY & PHYSICAL   HISTORY OF PRESENT ILLNESS:  [name] is a 35 year old married white  female, gravida 2, para 0-1-0-0 at 39 weeks who presents with regular  uterine contractions this afternoon and evening.  She denies leaking,  bleeding, headache, nausea, vomiting or visual disturbances.  Her pregnancy  has been followed [facility] M.D. service and has been  remarkable for:  1) Previous cesarean section.  2) History of preeclampsia.  3) History of endometriosis.  4) History of infertility requiring Clomid and  IUI to achieve this pregnancy.  5) Bicuspid aortic valve for which she takes  antibiotics.  6) Group B strep negative.  7) History of infant death from  diaphragmatic hernia.   PRENATAL LABORATORY DATA:  Her prenatal labs were collected on July 03, 2004.  Hemoglobin 12.0, hematocrit 36.2, platelets 217,000.  Blood type A  positive, antibody negative.  Toxoplasmosis immune.  RPR nonreactive.  Rubella immune.  Hepatitis B surface antigen negative.  Gonorrhea negative.  Chlamydia negative.  Cystic fibrosis negative.  Her one hour Glucola from  November 05, 2004, was elevated.  Her three hour glucose tolerance test at  the end of December was within normal limits.  Cultures of the vaginal track  for group B strep, gonorrhea and Chlamydia on December 29, 2004, were all  negative.   HISTORY OF PRESENT PREGNANCY:  The patient presented for care at [facility] on July 03, 2004, at 11 weeks' gestation.  She was treated for a  urinary tract infection as well as a yeast infection.  At that time, she was  considering a vaginal birth after  cesarean.  The patient was treated for  another urinary tract infection at 16 weeks' gestation.  Pregnancy  ultrasonography at 18 weeks' gestation shows growth consistent with previous  data confirming EDC of January 23, 2005.  Infant had a clinodactyly  curvature noted on ultrasound but no other markers.  Quad screen was within  normal limits.  The patient continued to want vaginal birth after cesarean.  Fetal fibronectin was done at 24 weeks' gestation and was negative.  This  was done because of Braxton-Hicks contractions.  The patient had elevated  one hour Glucola, requiring a three hour GTT which ended up being normal.  The rest of her prenatal care was unremarkable.   OB HISTORY:  She is a gravida 2, para 0-1-0-0.  In January of 2000 she had a  C-section for a female infant weighing five pounds at 34 weeks' gestation.  She was induced due to preeclampsia and then went for emergency C-section  due to fetal distress.  Infant's name was Madeline Everett.  He had diaphragmatic  hernia as well as a brain hemorrhage and he  only lived for three days.  She  required Zoloft that she took for three weeks after that delivery due to  postpartum issues.   PAST MEDICAL HISTORY:  1.  She had three laparoscopic surgeries for endometriosis.  2.  She has a history of a small fibroid.  3.  She required Clomid to achieve this pregnancy.  4.  She has a history of acid reflux.  5.  History of depression that was exacerbated with infertility.   ALLERGIES:  She has no medication allergies, although she is LACTOSE  INTOLERANT.   PAST SURGICAL HISTORY:  1.  Remarkable for laparoscopic x3.  2.  Foot surgery.  3.  Tonsillectomy.  4.  She also has a bicuspid aortic valve.  She sees Dr. Fraser Din at [facility]iology and takes antibiotics with dental work.   FAMILY HISTORY:  Grandmother and father with chronic hypertension.  Both  parents and maternal grandmother with diabetes.   GENETIC HISTORY:  Remarkable  for the patient's bicuspid aortic valve,  otherwise is negative.   SOCIAL HISTORY:  The patient is married to the father of the baby.  His name  is Trey Paula.  They are both high school educated and employed full time.  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  They  deny any alcohol, tobacco, or illicit drug use with the pregnancy.   PHYSICAL EXAMINATION:  VITAL SIGNS:  Stable.  She is afebrile.  HEENT:  Grossly within normal limits.  CHEST:  Clear to auscultation.  HEART:  Regular rate and rhythm.  ABDOMEN:  Gravid in contour with fundal height extending approximately 39 cm  to the pubic symphysis.  Fetal heart rate is reactive and reassuring.  Uterine contractions are every 1-1/2 to 3 minutes.  CERVIX:  She has changed her cervix from when she first arrived.  It was 1  and 50% and is now 2 cm and 80%, vertex, -2, two hours later.  EXTREMITIES:  Normal.   ASSESSMENT:  1.  Intrauterine pregnancy at term.  2.  Latent phase.  3.  Desiring V-back.   PLAN:  1.  Admit to birthing suites for consult with Dr. Pennie Rushing.  2.  Routine M.D. orders.  3.  Possibly begin antibiotics with her heart history.  4.  Dr. Pennie Rushing in to evaluate for pain medication.      KS/MEDQ  D:  01/16/2005  T:  01/17/2005  Job:  [id number]

## 2011-04-16 NOTE — Op Note (Signed)
NAME:  Madeline Everett, Madeline Everett                         ACCOUNT NO.:  [id number]   MEDICAL RECORD NO.:  [id number]                   PATIENT TYPE:  AMB   LOCATION:  SDC                                  [facility]   PHYSICIAN:  Crist Fat. Rivard, M.D.              DATE OF BIRTH:  17-Nov-1976   DATE OF PROCEDURE:  01/06/2004  DATE OF DISCHARGE:                                 OPERATIVE REPORT   PREOPERATIVE DIAGNOSES:  1. Pelvic pain with infertility.  2. Previous history of endometriosis.   POSTOPERATIVE DIAGNOSIS:  Stage I endometriosis with bilateral tubal  patency.   ANESTHESIA:  General anesthesia.   PROCEDURE:  Laparoscopy, cauterization of endometriosis, chromopertubation.   SURGEON:  Crist Fat. Rivard, M.D.   ESTIMATED BLOOD LOSS:  Minimal.   DESCRIPTION OF PROCEDURE:  After being informed of the planned procedure  with possible complications including bleeding, infection, injury to bowel,  bladder or ureters, need for laparotomy, informed consent was obtained.  The  patient is taken to OR #3, given general anesthesia with endotracheal  intubation without any complication.  She is placed in the lithotomy  position, prepped and draped in a sterile fashion.  Her bladder is emptied  with an in and out catheter and an acorn manipulator is placed in her  uterus, held by a tenaculum clamp on anterior lip of her cervix.   We proceed with infiltration of the umbilical area using 5 mL of Marcaine  0.25 and perform a semielipitical incision with knife.  This allows [place] easy  entry of the Veress needle for insufflation of pneumoperitoneum with CO2 at  a maximum pressure of 15 mmHg.  Veress needle was removed and a 10 mm trocar  is easily inserted.  A 5 mm trocar is placed suprapubically under direct  visualization after infiltration with 3 mL of Marcaine 0.25.   OBSERVATION:  The anterior cul-de-sac reveals 5 implants of endometriosis  all of which are a blue lesion.  Uterus is normal.   Posterior cul-de-sac  reveals seven implants of endometriosis, again, all of them blue lesions  varying from 1 to 3 mm in area with some retraction on the left uterosacral  ligament.  There is also one area of three implants, blue lesions of  endometriosis in the left ovarian fossa and two implants on the posterior  aspect of the right ovary.  Again, all of these implants varied between 1  and 3 mm and all of them are blue lesions.  Both ovaries otherwise are  normal.  Both tubes are normal.  The tubo-ovarian relationship is normal.  We also note a small adhesion with the sigmoid colon in the left pelvic wall  which does not interfere with the tubo-ovarian relationship.  Appendix is  visualized and normal.  Liver is visualized and normal.   We then proceed with systematic cauterization of all implants using the  cautery and at  all times do we maintain direct visualization of the ureters  especially on the left side where we have to cauterize above and below the  ureter for the left ovarian fossa and the left uterosacral ligament.   We then perform a chromopertubation which reveals a bilateral tubal patency  with no delays in filling.  The blue methylene has been removed from the  posterior cul-de-sac with a syringe.  Again ureters are evaluated and  normal.  Instruments are then removed.  After evacuation of the  pneumoperitoneum, the fascia of the 10 mm incision is closed with a 0 Vicryl  and skin is closed with subcuticular suture of 4-0 Vicryl and Steri-Strips.   Instrument and sponge count is complete x2.  Estimated blood loss is  minimal.  The procedure is very well tolerated by the patient who is taken  to the recovery room in a well and stable condition.                                               Crist Fat Rivard, M.D.    SAR/MEDQ  D:  01/06/2004  T:  01/06/2004  Job:  [id number]

## 2011-09-13 LAB — ABO/RH: ABO/RH(D): A POS

## 2011-09-13 LAB — CBC
HCT: 38.1
Hemoglobin: 12.9
MCHC: 33.9
MCV: 85.4
Platelets: 264
RBC: 4.47
RDW: 12.6
WBC: 7.9

## 2011-09-13 LAB — TYPE AND SCREEN
ABO/RH(D): A POS
Antibody Screen: NEGATIVE

## 2011-09-13 LAB — HCG, SERUM, QUALITATIVE: Preg, Serum: NEGATIVE

## 2012-01-28 DIAGNOSIS — D229 Melanocytic nevi, unspecified: Secondary | ICD-10-CM

## 2012-01-28 HISTORY — DX: Melanocytic nevi, unspecified: D22.9

## 2012-02-21 ENCOUNTER — Other Ambulatory Visit: Payer: Self-pay | Admitting: Family Medicine

## 2012-04-04 DIAGNOSIS — Z8742 Personal history of other diseases of the female genital tract: Secondary | ICD-10-CM

## 2012-04-05 ENCOUNTER — Encounter: Payer: Self-pay | Admitting: Obstetrics and Gynecology

## 2012-04-05 ENCOUNTER — Ambulatory Visit (INDEPENDENT_AMBULATORY_CARE_PROVIDER_SITE_OTHER): Payer: 59 | Admitting: Obstetrics and Gynecology

## 2012-04-05 ENCOUNTER — Other Ambulatory Visit: Payer: Self-pay | Admitting: Obstetrics and Gynecology

## 2012-04-05 VITALS — BP 122/64 | Ht 65.0 in | Wt 149.0 lb

## 2012-04-05 DIAGNOSIS — R1900 Intra-abdominal and pelvic swelling, mass and lump, unspecified site: Secondary | ICD-10-CM

## 2012-04-05 DIAGNOSIS — Z124 Encounter for screening for malignant neoplasm of cervix: Secondary | ICD-10-CM

## 2012-04-05 DIAGNOSIS — Z01419 Encounter for gynecological examination (general) (routine) without abnormal findings: Secondary | ICD-10-CM

## 2012-04-05 DIAGNOSIS — Z309 Encounter for contraceptive management, unspecified: Secondary | ICD-10-CM

## 2012-04-05 MED ORDER — NORETHIN ACE-ETH ESTRAD-FE 1-20 MG-MCG(24) PO TABS: 1.0000 | ORAL_TABLET | Freq: Every day | ORAL | Status: DC

## 2012-04-05 NOTE — Telephone Encounter (Signed)
[facility] per pt request  Ld  Pt notified  ld

## 2012-04-05 NOTE — Progress Notes (Signed)
The patient reports:normal menses, no abnormal bleeding, pelvic pain or discharge  Contraception:oral contraceptives (estrogen/progesterone) Loestrin 24  Last mammogram: patient has never had a mammogram Last pap: was normal May  2012  GC/Chlamydia cultures offered: declined HIV/RPR/HbsAg offered:  declined HSV 1 and 2 glycoprotein offered: declined  Menstrual cycle regular and monthly: Yes Menstrual flow normal: Yes  Urinary symptoms: none Normal bowel movements: Yes Reports abuse at home: No:   Subjective:    Madeline Everett is a 36 y.o. female, [phone], who presents for an annual exam.     History   Social History  . Marital Status: Married    Spouse Name: N/A    Number of Children: N/A  . Years of Education: N/A   Social History Main Topics  . Smoking status: Never Smoker   . Smokeless tobacco: Never Used  . Alcohol Use: Yes  . Drug Use: No  . Sexually Active: Yes    Birth Control/ Protection: Pill   Other Topics Concern  . None   Social History Narrative  . None    Menstrual cycle:   LMP: Patient's last menstrual period was 02/13/2012.           Cycle:   The following portions of the patient's history were reviewed and updated as appropriate: allergies, current medications, past family history, past medical history, past social history, past surgical history and problem list.  Review of Systems Pertinent items are noted in HPI. Breast:Negative for breast lump,nipple discharge or nipple retraction Gastrointestinal: Negative for abdominal pain, change in bowel habits or rectal bleeding Urinary:negative   Objective:    BP 122/64  Ht 5\' 5"  (1.651 m)  Wt 149 lb (67.586 kg)  BMI 24.79 kg/m2  LMP 02/13/2012    Weight:  Wt Readings from Last 1 Encounters:  04/05/12 149 lb (67.586 kg)          BMI: Body mass index is 24.79 kg/(m^2).  General Appearance: Alert, appropriate appearance for age. No acute distress HEENT: Grossly normal Neck / Thyroid:  Supple, no masses, nodes or enlargement Lungs: clear to auscultation bilaterally Back: No CVA tenderness Breast Exam: No masses or nodes.No dimpling, nipple retraction or discharge. Cardiovascular: Regular rate and rhythm. S1, S2, no murmur Gastrointestinal: Soft, non-tender, no masses or organomegaly Pelvic Exam: Vulva and vagina appear normal. Bimanual exam reveals normal uterus. Right adnexa enlarged approx. 4 cm slightly tender. Rectovaginal: not indicated Lymphatic Exam: Non-palpable nodes in neck, clavicular, axillary, or inguinal regions Skin: no rash or abnormalities Neurologic: Normal gait and speech, no tremor  Psychiatric: Alert and oriented, appropriate affect.   Wet Prep:not applicable Urinalysis:not applicable UPT: Not done   Assessment:    Posiible right pelvic mass in pt known for endometriosi O/W well managed with BCP    Plan:    pap smear return annually or prn Pelvic ultrasound ordered STD screening: declined Contraception:oral contraceptives (estrogen/progesterone) Loestrin 24      Laiyla Slagel AMD

## 2012-04-05 NOTE — Telephone Encounter (Signed)
Madeline Everett/seen today/SR

## 2012-04-07 LAB — PAP IG W/ RFLX HPV ASCU

## 2012-04-13 ENCOUNTER — Encounter: Payer: Self-pay | Admitting: Obstetrics and Gynecology

## 2012-04-13 ENCOUNTER — Ambulatory Visit (INDEPENDENT_AMBULATORY_CARE_PROVIDER_SITE_OTHER): Payer: 59 | Admitting: Obstetrics and Gynecology

## 2012-04-13 ENCOUNTER — Ambulatory Visit (INDEPENDENT_AMBULATORY_CARE_PROVIDER_SITE_OTHER): Payer: 59

## 2012-04-13 ENCOUNTER — Other Ambulatory Visit: Payer: Self-pay | Admitting: Obstetrics and Gynecology

## 2012-04-13 DIAGNOSIS — N809 Endometriosis, unspecified: Secondary | ICD-10-CM

## 2012-04-13 DIAGNOSIS — R1900 Intra-abdominal and pelvic swelling, mass and lump, unspecified site: Secondary | ICD-10-CM

## 2012-04-13 NOTE — Progress Notes (Signed)
Sono:   Anteverted uterus.  No uterine abnormality is seen.   Both ovaries are visualized and are normal for patients age.   No free fluid.  Normal pelvic ultrasound.  Reassurance Follow-up for AEX

## 2012-08-03 ENCOUNTER — Telehealth: Payer: Self-pay

## 2012-08-03 MED ORDER — NORETHIN ACE-ETH ESTRAD-FE 1-20 MG-MCG(24) PO CHEW: 1.0000 | CHEWABLE_TABLET | Freq: Every day | ORAL | Status: DC

## 2012-08-03 NOTE — Telephone Encounter (Signed)
PC to pt. Pt distressed over d/c of Loestrin 24.  Explained to pt that it isn't d/c, just changed the name.  [facility] supply [facility] and [facility] supply [facility] with 2 refills.  Pt agreeable.  ld

## 2013-10-08 ENCOUNTER — Other Ambulatory Visit: Payer: Self-pay | Admitting: Obstetrics and Gynecology

## 2013-10-08 ENCOUNTER — Other Ambulatory Visit: Payer: Self-pay

## 2013-10-08 NOTE — H&P (Signed)
Madeline Everett is a 37 y.o. female  P 2-1-0-2 presents for hysterectomy because of endometriosis, pelvic pain and dysmenorrhea.  In the past the patient had heavy menses that occurred every 2 weeks and would last for 10 days.  She would change her pad every 2 hours and experience severe cramping.  Fortunately, she has found relief from her bleeding with oral contraception however, her pelvic pain remains.  She admits to  deep dyspareunia and  constipation but no urinary tract symptoms, inter-menstrual bleeding or cramping (while on continuous oral contraception).  A pelvic ultrasound, May 2013 showed a uterus: 5.71 x 4.52 x 3.46 cm,  endometrium: 0.212,  right ovary: 2.21 x 1.61 x 1.33 cm and left ovary: 1.93 x 1.40 x 1.01 cm.  The patient has had five laparoscopic surgeries to address her endometriosis and pelvic pain and had been on other hormonal therapies to manage her bleeding though the continuous oral contraceptives have been the most effective.  After reviewing both medical and surgical management options for management of her condition,  the patient has decided to proceed with hysterectomy.  Past Medical History  OB History: G 3;  P-2-1-0-2 ;  The patient had #2 C-sections 2000 and 2010 and a VBAC 2006   GYN History: menarche: 37 YO;  LMP: Amenorrheic due to continuous oral contraceptives;  Single remote  history of abnormal PAP smear that was repeated and returned normal;  Last PAP smear: June 2014  Medical History: Anal Fissures, Depression, Herniated Disc (L 5-S 1), GERD, Endometriosis, Infertility, Migraines, IBS, Bicuspid Aortic Valve  Surgical History: 1993-Tonsilectomy; 1998, 2002, 2005, 2007 and 2010 Laparoscopy to include resection of endometriosis implants,  1999 Bilateral Bunionectomies, 2006 Perineaplasty, 2010  Laparoscopic Dermoid Cyst Excision Admits to problems awakening from anesthesia  and severe post operative nausea and vomiting; Denies a  history of blood  transfusions  Family History: Diabetes Mellitus, Hypertension and Cardiovascular Disease  Social History: Married and employed as a Scientist, forensic; Denies tobacco or illicit drug use but admits to occasional alcohol  Medications:  Buproprion HCL SR 150 mg daily Fluoxetine 10 mg daily Fluticasone 50 mcg Nasal Spray daily prn Lisonopril 5 mg daily Mnisastrin FE daily Pro Air 2 puffs every 4 hours prn Topiramate 25 mg prn  Allergies  Allergen Reactions  . Codeine   . Lactose Intolerance (Gi)   . Percocet [Oxycodone-Acetaminophen]   . Vicodin [Hydrocodone-Acetaminophen]   . Amoxicillin     Denies sensitivity to peanuts, shellfish, soy, or latex. Admits to adhesive sensitivity.  ROS: Admits to glasses, constipation,  weight gain, allergies and bloatedness;  denies headache, vision changes, nasal congestion, dysphagia, tinnitus, dizziness, hoarseness, cough,  chest pain, shortness of breath, nausea, vomiting, diarrhea, urinary frequency, urgency  dysuria, hematuria, vaginitis symptoms, swelling of joints,easy bruising,  myalgias, arthralgias, skin rashes, unexplained weight loss and except as is mentioned in the history of present illness, patient's review of systems is otherwise negative.   Physical Exam  Bp: 108/70   P: 64    R: 12    Temperature: 99.2 degrees F orally   Weight: 160.5 lbs  Height: 5\' 4"     BMI: 27.5  Neck: supple without masses or thyromegaly Lungs: clear to auscultation Heart: regular rate and rhythm Abdomen: soft, diffusely tender in both lower quadrants but no guarding or rebound and no organomegaly Pelvic:EGBUS- wnl; vagina-normal rugae, tender; uterus-normal size, tender, cervix without lesions or motion tenderness; adnexae- tender  without  or masses Extremities:  no  clubbing, cyanosis or edema  UPT: negative Endometrial Biopsy-limited endometrial tissue; mucous, scanty endometrium with benign endocervix; no evidence of malignancy, atypia or  hyperplasia.  Assesment: Endometriosis                      Pelvic Pain                      H/O Menorrhagia   Disposition:  The patient was given the indication for her procedure(s) along with the risks, which include but are not limited to, reaction to anesthesia, damage to adjacent organs, infection, excessive bleeding, formation of scar tissue, early menopause, pelvic prolapse, need to leave cervix in place ( with possible subsequent bleeding) and the possible need for an open abdominal incision. Regarding possible supra-cervical hysterectomy, the patient was advised of the risks of morcellation** (as outlined for patient's who are having surgery for fibroids). She was further advised that she will experience transient post operative facial edema, that her hospital stay is expected to be 0-2 days, she should be able to return to her usual activities within 2-3 weeks (except intercourse to be delayed until 6 weeks) and that the robotic approach to her surgery requires more time to perform than an open abdominal approach. Patient was given the Miralax bowel prep to be completed 24 hours prior to procedure. She verbalized understanding of these risks and pre-operative instructions and has consented to proceed with a Robot Assisted Laparosopic Htysterctomy with Bilaeral Salpingectomy at [facility] [place] on November 01, 2013 at 12:45 pm.  **The patient is also aware that should morcellation of the uterus be performed, and she is later found to have a uterine sarcoma (type of cancer) that the morcellation procedure may increase her risk of spreading the cancerous tissue that may in turn worsen a patent's chance of long-term survival. According to the FDA, sarcoma is estimated to occur 1 in 350 women undergoing surgery for fibroids.   CSN# [id number]   Rhyse Loux J. Lowell Guitar, PA-C  for Dr. Crist Fat. Rivard

## 2013-10-15 ENCOUNTER — Other Ambulatory Visit: Payer: Self-pay | Admitting: Obstetrics and Gynecology

## 2013-10-18 ENCOUNTER — Encounter (HOSPITAL_COMMUNITY): Payer: Self-pay | Admitting: Pharmacist

## 2013-10-18 NOTE — Patient Instructions (Addendum)
Your procedure is scheduled on: 11/01/2013  Enter through the Main Entrance of [facility] at: 1115AM  Pick up the phone at the desk and dial [phone].   Call this number if you have problems the morning of surgery: [phone].  Remember: Do NOT eat food: AFTER MIDNIGHT 10/31/2013 Do NOT drink clear liquids after: AFTER 0845AM DAY OF SURGERY Take these medicines the morning of surgery with a SIP OF WATER:  WELLBUTRIN, ZANTAC  Do NOT wear jewelry (body piercing), make-up, or nail polish. Do NOT wear lotions, powders, or perfumes.  You may wear deoderant. Do NOT shave for 48 hours prior to surgery. Do NOT bring valuables to the hospital. Contacts, dentures, or bridgework may not be worn into surgery. Leave suitcase in car.  After surgery it may be brought to your room.  For patients admitted to the hospital, checkout time is 11:00 AM the day of discharge.

## 2013-10-19 ENCOUNTER — Encounter (HOSPITAL_COMMUNITY)
Admission: RE | Admit: 2013-10-19 | Discharge: 2013-10-19 | Disposition: A | Discharge status: Home / Self Care | Payer: 59 | Source: Ambulatory Visit | Attending: Obstetrics and Gynecology | Admitting: Obstetrics and Gynecology

## 2013-10-19 ENCOUNTER — Encounter (HOSPITAL_COMMUNITY): Payer: Self-pay

## 2013-10-19 DIAGNOSIS — Z01812 Encounter for preprocedural laboratory examination: Secondary | ICD-10-CM | POA: Insufficient documentation

## 2013-10-19 DIAGNOSIS — Z01818 Encounter for other preprocedural examination: Secondary | ICD-10-CM | POA: Insufficient documentation

## 2013-10-19 HISTORY — DX: Headache: R51

## 2013-10-19 HISTORY — DX: Nausea with vomiting, unspecified: R11.2

## 2013-10-19 HISTORY — DX: Cardiac murmur, unspecified: R01.1

## 2013-10-19 HISTORY — DX: Other specified postprocedural states: Z98.890

## 2013-10-19 LAB — CBC
MCHC: 34 g/dL (ref 30.0–36.0)
Platelets: 273 10*3/uL (ref 150–400)
RDW: 12.7 % (ref 11.5–15.5)

## 2013-10-19 LAB — BASIC METABOLIC PANEL
BUN: 9 mg/dL (ref 6–23)
CO2: 21 mEq/L (ref 19–32)
Calcium: 9.1 mg/dL (ref 8.4–10.5)
Creatinine, Ser: 0.97 mg/dL (ref 0.50–1.10)
GFR calc Af Amer: 86 mL/min — ABNORMAL LOW (ref >90)
GFR calc non Af Amer: 74 mL/min — ABNORMAL LOW (ref >90)
Potassium: 4.4 mEq/L (ref 3.5–5.1)

## 2013-10-31 MED ORDER — CIPROFLOXACIN IN D5W 400 MG/200ML IV SOLN
400.0000 mg | INTRAVENOUS | Status: AC
  Administered 2013-11-01: 400 mg via INTRAVENOUS
  Filled 2013-10-31: qty 200

## 2013-10-31 MED ORDER — CLINDAMYCIN PHOSPHATE 900 MG/50ML IV SOLN
900.0000 mg | INTRAVENOUS | Status: AC
  Administered 2013-11-01: 900 mg via INTRAVENOUS
  Filled 2013-10-31: qty 50

## 2013-11-01 ENCOUNTER — Encounter (HOSPITAL_COMMUNITY): Payer: Self-pay | Admitting: Certified Registered"

## 2013-11-01 ENCOUNTER — Ambulatory Visit (HOSPITAL_COMMUNITY): Payer: 59 | Admitting: Anesthesiology

## 2013-11-01 ENCOUNTER — Encounter (HOSPITAL_COMMUNITY)
Admission: RE | Disposition: A | Discharge status: Home / Self Care | Payer: Self-pay | Source: Ambulatory Visit | Attending: Obstetrics and Gynecology

## 2013-11-01 ENCOUNTER — Encounter (HOSPITAL_COMMUNITY): Payer: 59 | Admitting: Anesthesiology

## 2013-11-01 ENCOUNTER — Ambulatory Visit (HOSPITAL_COMMUNITY)
Admission: RE | Admit: 2013-11-01 | Discharge: 2013-11-01 | Disposition: A | Discharge status: Home / Self Care | Payer: 59 | Source: Ambulatory Visit | Attending: Obstetrics and Gynecology | Admitting: Obstetrics and Gynecology

## 2013-11-01 DIAGNOSIS — N803 Endometriosis of pelvic peritoneum: Secondary | ICD-10-CM

## 2013-11-01 DIAGNOSIS — N854 Malposition of uterus: Secondary | ICD-10-CM | POA: Insufficient documentation

## 2013-11-01 DIAGNOSIS — G8929 Other chronic pain: Secondary | ICD-10-CM | POA: Insufficient documentation

## 2013-11-01 DIAGNOSIS — N949 Unspecified condition associated with female genital organs and menstrual cycle: Secondary | ICD-10-CM | POA: Insufficient documentation

## 2013-11-01 DIAGNOSIS — K5900 Constipation, unspecified: Secondary | ICD-10-CM | POA: Insufficient documentation

## 2013-11-01 DIAGNOSIS — IMO0002 Reserved for concepts with insufficient information to code with codable children: Secondary | ICD-10-CM | POA: Insufficient documentation

## 2013-11-01 DIAGNOSIS — D282 Benign neoplasm of uterine tubes and ligaments: Secondary | ICD-10-CM | POA: Insufficient documentation

## 2013-11-01 DIAGNOSIS — N946 Dysmenorrhea, unspecified: Secondary | ICD-10-CM | POA: Insufficient documentation

## 2013-11-01 DIAGNOSIS — D252 Subserosal leiomyoma of uterus: Secondary | ICD-10-CM | POA: Insufficient documentation

## 2013-11-01 DIAGNOSIS — R102 Pelvic and perineal pain: Secondary | ICD-10-CM | POA: Diagnosis present

## 2013-11-01 HISTORY — PX: ROBOTIC ASSISTED TOTAL HYSTERECTOMY: SHX6085

## 2013-11-01 SURGERY — ROBOTIC ASSISTED TOTAL HYSTERECTOMY
Anesthesia: General | Site: Abdomen | Laterality: Bilateral

## 2013-11-01 MED ORDER — SCOPOLAMINE 1 MG/3DAYS TD PT72
MEDICATED_PATCH | TRANSDERMAL | Status: AC
  Administered 2013-11-01: 1.5 mg via TRANSDERMAL
  Filled 2013-11-01: qty 1

## 2013-11-01 MED ORDER — DEXAMETHASONE SODIUM PHOSPHATE 10 MG/ML IJ SOLN
INTRAMUSCULAR | Status: DC | PRN
  Administered 2013-11-01: 10 mg via INTRAVENOUS

## 2013-11-01 MED ORDER — LACTATED RINGERS IV SOLN
INTRAVENOUS | Status: DC
  Administered 2013-11-01 (×3): via INTRAVENOUS

## 2013-11-01 MED ORDER — FENTANYL CITRATE 0.05 MG/ML IJ SOLN
INTRAMUSCULAR | Status: DC | PRN
  Administered 2013-11-01 (×3): 50 ug via INTRAVENOUS

## 2013-11-01 MED ORDER — KETOROLAC TROMETHAMINE 30 MG/ML IJ SOLN
INTRAMUSCULAR | Status: AC
  Filled 2013-11-01: qty 1

## 2013-11-01 MED ORDER — TRAMADOL HCL 50 MG PO TABS: 100.0000 mg | ORAL_TABLET | Freq: Four times a day (QID) | ORAL | Status: DC | PRN

## 2013-11-01 MED ORDER — MIDAZOLAM HCL 2 MG/2ML IJ SOLN: 0.5000 mg | Freq: Once | INTRAMUSCULAR | Status: DC | PRN

## 2013-11-01 MED ORDER — MIDAZOLAM HCL 2 MG/2ML IJ SOLN
INTRAMUSCULAR | Status: DC | PRN
  Administered 2013-11-01: 2 mg via INTRAVENOUS

## 2013-11-01 MED ORDER — ONDANSETRON HCL 4 MG PO TABS: 4.0000 mg | ORAL_TABLET | Freq: Three times a day (TID) | ORAL | Status: DC | PRN

## 2013-11-01 MED ORDER — LIDOCAINE HCL (CARDIAC) 20 MG/ML IV SOLN
INTRAVENOUS | Status: AC
  Filled 2013-11-01: qty 5

## 2013-11-01 MED ORDER — METHYLENE BLUE 1 % INJ SOLN
INTRAMUSCULAR | Status: AC
  Filled 2013-11-01: qty 20

## 2013-11-01 MED ORDER — ONDANSETRON HCL 4 MG/2ML IJ SOLN
INTRAMUSCULAR | Status: DC | PRN
  Administered 2013-11-01: 4 mg via INTRAVENOUS

## 2013-11-01 MED ORDER — LACTATED RINGERS IR SOLN
Status: DC | PRN
  Administered 2013-11-01: 3000 mL

## 2013-11-01 MED ORDER — IBUPROFEN 600 MG PO TABS
600.0000 mg | ORAL_TABLET | Freq: Four times a day (QID) | ORAL | Status: DC | PRN
  Administered 2013-11-01: 600 mg via ORAL
  Filled 2013-11-01: qty 1

## 2013-11-01 MED ORDER — ROCURONIUM BROMIDE 100 MG/10ML IV SOLN
INTRAVENOUS | Status: DC | PRN
  Administered 2013-11-01: 10 mg via INTRAVENOUS

## 2013-11-01 MED ORDER — MIDAZOLAM HCL 2 MG/2ML IJ SOLN
INTRAMUSCULAR | Status: AC
  Filled 2013-11-01: qty 2

## 2013-11-01 MED ORDER — DEXAMETHASONE SODIUM PHOSPHATE 10 MG/ML IJ SOLN
INTRAMUSCULAR | Status: AC
  Filled 2013-11-01: qty 1

## 2013-11-01 MED ORDER — STERILE WATER FOR IRRIGATION IR SOLN
Status: DC | PRN
  Administered 2013-11-01: 1000 mL via INTRAVESICAL

## 2013-11-01 MED ORDER — SODIUM CHLORIDE 0.9 % IJ SOLN
INTRAMUSCULAR | Status: AC
  Filled 2013-11-01: qty 50

## 2013-11-01 MED ORDER — NEOSTIGMINE METHYLSULFATE 1 MG/ML IJ SOLN
INTRAMUSCULAR | Status: AC
  Filled 2013-11-01: qty 1

## 2013-11-01 MED ORDER — PHENYLEPHRINE HCL 10 MG/ML IJ SOLN
INTRAMUSCULAR | Status: DC | PRN
  Administered 2013-11-01 (×2): 80 ug via INTRAVENOUS
  Administered 2013-11-01 (×2): 40 ug via INTRAVENOUS
  Administered 2013-11-01 (×2): 80 ug via INTRAVENOUS
  Administered 2013-11-01: 40 ug via INTRAVENOUS

## 2013-11-01 MED ORDER — SCOPOLAMINE 1 MG/3DAYS TD PT72
1.0000 | MEDICATED_PATCH | TRANSDERMAL | Status: DC
  Administered 2013-11-01: 1.5 mg via TRANSDERMAL

## 2013-11-01 MED ORDER — GLYCOPYRROLATE 0.2 MG/ML IJ SOLN
INTRAMUSCULAR | Status: AC
  Filled 2013-11-01: qty 3

## 2013-11-01 MED ORDER — ACETAMINOPHEN 10 MG/ML IV SOLN
1000.0000 mg | Freq: Once | INTRAVENOUS | Status: AC
  Administered 2013-11-01: 1000 mg via INTRAVENOUS
  Filled 2013-11-01: qty 100

## 2013-11-01 MED ORDER — LIDOCAINE HCL 2 % EX GEL
CUTANEOUS | Status: DC | PRN
  Administered 2013-11-01: 1 via URETHRAL

## 2013-11-01 MED ORDER — NEOSTIGMINE METHYLSULFATE 1 MG/ML IJ SOLN
INTRAMUSCULAR | Status: DC | PRN
  Administered 2013-11-01: 4 mg via INTRAVENOUS

## 2013-11-01 MED ORDER — ROPIVACAINE HCL 5 MG/ML IJ SOLN
INTRAMUSCULAR | Status: AC
  Filled 2013-11-01: qty 30

## 2013-11-01 MED ORDER — ROCURONIUM BROMIDE 100 MG/10ML IV SOLN
INTRAVENOUS | Status: AC
  Filled 2013-11-01: qty 1

## 2013-11-01 MED ORDER — KETOROLAC TROMETHAMINE 30 MG/ML IJ SOLN
INTRAMUSCULAR | Status: DC | PRN
  Administered 2013-11-01: 30 mg via INTRAMUSCULAR
  Administered 2013-11-01: 30 mg via INTRAVENOUS

## 2013-11-01 MED ORDER — LACTATED RINGERS IV SOLN: INTRAVENOUS | Status: DC

## 2013-11-01 MED ORDER — SODIUM CHLORIDE 0.9 % IJ SOLN
INTRAMUSCULAR | Status: AC
  Filled 2013-11-01: qty 10

## 2013-11-01 MED ORDER — CITRIC ACID-SODIUM CITRATE 334-500 MG/5ML PO SOLN
30.0000 mL | Freq: Once | ORAL | Status: AC | PRN
  Administered 2013-11-01: 30 mL via ORAL

## 2013-11-01 MED ORDER — PROMETHAZINE HCL 25 MG/ML IJ SOLN: 6.2500 mg | INTRAMUSCULAR | Status: DC | PRN

## 2013-11-01 MED ORDER — ROPIVACAINE HCL 5 MG/ML IJ SOLN
INTRAMUSCULAR | Status: DC | PRN
  Administered 2013-11-01: 60 mL

## 2013-11-01 MED ORDER — ONDANSETRON HCL 4 MG/2ML IJ SOLN
INTRAMUSCULAR | Status: AC
  Filled 2013-11-01: qty 2

## 2013-11-01 MED ORDER — FENTANYL CITRATE 0.05 MG/ML IJ SOLN
INTRAMUSCULAR | Status: AC
  Filled 2013-11-01: qty 5

## 2013-11-01 MED ORDER — PHENYLEPHRINE 40 MCG/ML (10ML) SYRINGE FOR IV PUSH (FOR BLOOD PRESSURE SUPPORT)
PREFILLED_SYRINGE | INTRAVENOUS | Status: AC
  Filled 2013-11-01: qty 5

## 2013-11-01 MED ORDER — LIDOCAINE HCL 2 % EX GEL
CUTANEOUS | Status: AC
  Filled 2013-11-01: qty 5

## 2013-11-01 MED ORDER — LIDOCAINE HCL (CARDIAC) 20 MG/ML IV SOLN
INTRAVENOUS | Status: DC | PRN
  Administered 2013-11-01: 80 mg via INTRAVENOUS

## 2013-11-01 MED ORDER — IBUPROFEN 600 MG PO TABS: ORAL_TABLET | ORAL | Status: DC

## 2013-11-01 MED ORDER — PROPOFOL 10 MG/ML IV EMUL
INTRAVENOUS | Status: AC
  Filled 2013-11-01: qty 20

## 2013-11-01 MED ORDER — PROPOFOL 10 MG/ML IV BOLUS
INTRAVENOUS | Status: DC | PRN
  Administered 2013-11-01: 160 mg via INTRAVENOUS

## 2013-11-01 MED ORDER — GLYCOPYRROLATE 0.2 MG/ML IJ SOLN
INTRAMUSCULAR | Status: DC | PRN
  Administered 2013-11-01: .8 mg via INTRAVENOUS

## 2013-11-01 MED ORDER — METHYLENE BLUE 1 % INJ SOLN
INTRAMUSCULAR | Status: DC | PRN
  Administered 2013-11-01: 7 mL via INTRAVENOUS

## 2013-11-01 MED ORDER — PROMETHAZINE HCL 25 MG/ML IJ SOLN
12.5000 mg | Freq: Four times a day (QID) | INTRAMUSCULAR | Status: DC | PRN
  Administered 2013-11-01: 12.5 mg via INTRAVENOUS
  Filled 2013-11-01: qty 1

## 2013-11-01 MED ORDER — MENTHOL 3 MG MT LOZG: 1.0000 | LOZENGE | OROMUCOSAL | Status: DC | PRN

## 2013-11-01 MED ORDER — CITRIC ACID-SODIUM CITRATE 334-500 MG/5ML PO SOLN
ORAL | Status: AC
  Administered 2013-11-01: 30 mL via ORAL
  Filled 2013-11-01: qty 15

## 2013-11-01 MED ORDER — FENTANYL CITRATE 0.05 MG/ML IJ SOLN
INTRAMUSCULAR | Status: AC
  Administered 2013-11-01: 50 ug via INTRAVENOUS
  Filled 2013-11-01: qty 2

## 2013-11-01 MED ORDER — SUCCINYLCHOLINE CHLORIDE 20 MG/ML IJ SOLN
INTRAMUSCULAR | Status: DC | PRN
  Administered 2013-11-01: 10 mg via INTRAVENOUS
  Administered 2013-11-01: 40 mg via INTRAVENOUS

## 2013-11-01 MED ORDER — KETOROLAC TROMETHAMINE 30 MG/ML IJ SOLN: 15.0000 mg | Freq: Once | INTRAMUSCULAR | Status: DC | PRN

## 2013-11-01 MED ORDER — MEPERIDINE HCL 25 MG/ML IJ SOLN: 6.2500 mg | INTRAMUSCULAR | Status: DC | PRN

## 2013-11-01 MED ORDER — FENTANYL CITRATE 0.05 MG/ML IJ SOLN
25.0000 ug | INTRAMUSCULAR | Status: DC | PRN
  Administered 2013-11-01: 50 ug via INTRAVENOUS

## 2013-11-01 SURGICAL SUPPLY — 71 items
ADH SKN CLS APL DERMABOND .7 (GAUZE/BANDAGES/DRESSINGS) ×1
APL SKNCLS STERI-STRIP NONHPOA (GAUZE/BANDAGES/DRESSINGS) ×1
BAG URINE DRAINAGE (UROLOGICAL SUPPLIES) ×3 IMPLANT
BARRIER ADHS 3X4 INTERCEED (GAUZE/BANDAGES/DRESSINGS) ×2 IMPLANT
BENZOIN TINCTURE PRP APPL 2/3 (GAUZE/BANDAGES/DRESSINGS) ×2 IMPLANT
BRR ADH 4X3 ABS CNTRL BYND (GAUZE/BANDAGES/DRESSINGS) ×1
CATH FOLEY 2WAY  3CC  8FR (CATHETERS) ×1
CATH FOLEY 2WAY 3CC 8FR (CATHETERS) IMPLANT
CATH FOLEY 3WAY  5CC 16FR (CATHETERS) ×2
CATH FOLEY 3WAY  5CC 18FR (CATHETERS)
CATH FOLEY 3WAY 5CC 16FR (CATHETERS) ×2 IMPLANT
CATH FOLEY 3WAY 5CC 18FR (CATHETERS) ×1 IMPLANT
CHLORAPREP W/TINT 26ML (MISCELLANEOUS) ×2 IMPLANT
CLOTH BEACON ORANGE TIMEOUT ST (SAFETY) ×2 IMPLANT
CONT PATH 16OZ SNAP LID 3702 (MISCELLANEOUS) ×2 IMPLANT
CORD ACTIVE DISPOSABLE (ELECTRODE)
CORD ELECTRO ACTIVE DISP (ELECTRODE) IMPLANT
CORDS BIPOLAR (ELECTRODE) IMPLANT
COVER MAYO STAND STRL (DRAPES) ×2 IMPLANT
COVER TABLE BACK 60X90 (DRAPES) ×4 IMPLANT
COVER TIP SHEARS 8 DVNC (MISCELLANEOUS) ×1 IMPLANT
COVER TIP SHEARS 8MM DA VINCI (MISCELLANEOUS) ×1
DECANTER SPIKE VIAL GLASS SM (MISCELLANEOUS) ×2 IMPLANT
DERMABOND ADVANCED (GAUZE/BANDAGES/DRESSINGS) ×1
DERMABOND ADVANCED .7 DNX12 (GAUZE/BANDAGES/DRESSINGS) ×1 IMPLANT
DRAPE HUG U DISPOSABLE (DRAPE) ×2 IMPLANT
DRAPE LG THREE QUARTER DISP (DRAPES) ×4 IMPLANT
DRAPE WARM FLUID 44X44 (DRAPE) ×2 IMPLANT
ELECT REM PT RETURN 9FT ADLT (ELECTROSURGICAL) ×2
ELECTRODE REM PT RTRN 9FT ADLT (ELECTROSURGICAL) ×1 IMPLANT
EVACUATOR SMOKE 8.L (FILTER) ×2 IMPLANT
GAUZE VASELINE 3X9 (GAUZE/BANDAGES/DRESSINGS) IMPLANT
GLOVE BIO SURGEON STRL SZ7 (GLOVE) ×2 IMPLANT
GLOVE BIOGEL PI IND STRL 7.0 (GLOVE) ×2 IMPLANT
GLOVE BIOGEL PI INDICATOR 7.0 (GLOVE) ×2
GLOVE ECLIPSE 6.5 STRL STRAW (GLOVE) ×6 IMPLANT
GOWN STRL REIN XL XLG (GOWN DISPOSABLE) ×12 IMPLANT
KIT ACCESSORY DA VINCI DISP (KITS) ×1
KIT ACCESSORY DVNC DISP (KITS) ×1 IMPLANT
LEGGING LITHOTOMY PAIR STRL (DRAPES) ×2 IMPLANT
NEEDLE HYPO 22GX1.5 SAFETY (NEEDLE) ×2 IMPLANT
OCCLUDER COLPOPNEUMO (BALLOONS) ×1 IMPLANT
PACK LAVH (CUSTOM PROCEDURE TRAY) ×2 IMPLANT
PAD PREP 24X48 CUFFED NSTRL (MISCELLANEOUS) ×4 IMPLANT
PLUG CATH AND CAP STER (CATHETERS) ×2 IMPLANT
PROTECTOR NERVE ULNAR (MISCELLANEOUS) ×4 IMPLANT
SET CYSTO W/LG BORE CLAMP LF (SET/KITS/TRAYS/PACK) IMPLANT
SET IRRIG TUBING LAPAROSCOPIC (IRRIGATION / IRRIGATOR) ×2 IMPLANT
SOLUTION ELECTROLUBE (MISCELLANEOUS) ×2 IMPLANT
STRIP CLOSURE SKIN 1/2X4 (GAUZE/BANDAGES/DRESSINGS) ×2 IMPLANT
SUT MNCRL AB 3-0 PS2 27 (SUTURE) ×1 IMPLANT
SUT VIC AB 0 CT1 27 (SUTURE) ×12
SUT VIC AB 0 CT1 27XBRD ANBCTR (SUTURE) ×2 IMPLANT
SUT VIC AB 0 CT1 27XBRD ANTBC (SUTURE) ×4 IMPLANT
SUT VICRYL 0 UR6 27IN ABS (SUTURE) ×4 IMPLANT
SYR 50ML LL SCALE MARK (SYRINGE) ×2 IMPLANT
SYSTEM CONVERTIBLE TROCAR (TROCAR) ×2 IMPLANT
TIP RUMI ORANGE 6.7MMX12CM (TIP) IMPLANT
TIP UTERINE 5.1X6CM LAV DISP (MISCELLANEOUS) ×1 IMPLANT
TIP UTERINE 6.7X10CM GRN DISP (MISCELLANEOUS) IMPLANT
TIP UTERINE 6.7X6CM WHT DISP (MISCELLANEOUS) IMPLANT
TIP UTERINE 6.7X8CM BLUE DISP (MISCELLANEOUS) IMPLANT
TOWEL OR 17X24 6PK STRL BLUE (TOWEL DISPOSABLE) ×6 IMPLANT
TROCAR 12M 150ML BLUNT (TROCAR) ×2 IMPLANT
TROCAR DISP BLADELESS 8 DVNC (TROCAR) ×1 IMPLANT
TROCAR DISP BLADELESS 8MM (TROCAR) ×1
TROCAR XCEL 12X100 BLDLESS (ENDOMECHANICALS) ×2 IMPLANT
TROCAR XCEL NON-BLD 5MMX100MML (ENDOMECHANICALS) ×1 IMPLANT
TUBING FILTER THERMOFLATOR (ELECTROSURGICAL) ×2 IMPLANT
WARMER LAPAROSCOPE (MISCELLANEOUS) ×2 IMPLANT
WATER STERILE IRR 1000ML POUR (IV SOLUTION) ×6 IMPLANT

## 2013-11-01 NOTE — Preoperative (Signed)
Beta Blockers   Reason not to administer Beta Blockers:Not Applicable 

## 2013-11-01 NOTE — Progress Notes (Signed)
Discharge teaching complete. Pt understood all instructions. Pt left floor in a wheelchair and discharged home with husband.

## 2013-11-01 NOTE — Transfer of Care (Signed)
Immediate Anesthesia Transfer of Care Note  Patient: Madeline Everett  Procedure(s) Performed: Procedure(s): ROBOTIC ASSISTED TOTAL HYSTERECTOMY WITH BILATERAL SALPINOGECTOMY (Bilateral)  Patient Location: PACU  Anesthesia Type:General  Level of Consciousness: awake, alert  and oriented  Airway & Oxygen Therapy: Patient Spontanous Breathing and Patient connected to nasal cannula oxygen  Post-op Assessment: Report given to PACU RN, Post -op Vital signs reviewed and stable and Patient moving all extremities  Post vital signs: Reviewed and stable  Complications: No apparent anesthesia complications

## 2013-11-01 NOTE — Op Note (Signed)
Preoperative diagnosis: Chronic pelvic pain with endometriosis  Postoperative diagnosis: Same   Anesthesia: General  Anesthesiologist: Dr. Brayton Caves  Procedure: Robotically assisted total hysterectomy with bilateral salpingectomy  Surgeon: Dr. Dois Davenport Palyn Scrima  Assistant: Henreitta Leber P.A.-C.  Estimated blood loss: minimal  Procedure:  After being informed of the planned procedure with possible complications including but not limited to bleeding, infection, injury to other organs, need for laparotomy, possible need for morcellation with risks and benefits reviewed, expected hospital stay and recovery, informed consent is obtained and patient is taken to or #7. She is placed in  lithotomy position on a sticky mattress and beanbag with both arms padded and tucked on each side and bilateral knee-high sequential compressive devices. She is given general anesthesia with endotracheal intubation without any complication. She is prepped and draped in a sterile fashion. A three-way Foley catheter is inserted in her bladder.  Pelvic exam reveals: anteverted uterus, normal in size and mobile. 2 normal adnexa  A weighted speculum is inserted in the vagina and the anterior lip of the cervix is grasped with a tenaculum forcep. We proceed with a paracervical block and vaginal infiltration using ropivacaine 0.5% diluted 1 in 1 with saline. The uterus was then sounded at 7 cm. We easily dilate the cervix using Hegar dilator to  #27 which allows for easy placement of the intrauterine RUMI manipulator with a 3.0 KOH ring and a vaginal occluder. The ring is sutured to the cervix with 0 Vicryl.  Trocar placement is decided. We infiltrate at the umbilicus with 10 cc of ropivacaine per protocol and perform a 10 mm vertical incision which is brought down bluntly to the fascia. The fascia is identified and grasped with Coker forceps. The fascia is incised with Mayo scissors. Peritoneum is entered bluntly. A  pursestring suture of 0 Vicryl is placed on the fascia and a 10 mm Hassan trocar is easily inserted in the abdominal cavity held in placed with a Purstring suture. This allows for easy insufflation of a pneumoperitoneum using warmed CO2 at a maximum pressure of 15 mm of mercury. 60 cc of Ropivacaine 0.5 % diluted 1 in 1 is sent in the pelvis and the patient is positioned in reverse Trendelenburg. We then placed one 8mm robotic trocar on the left, one 8mm robotic trocar on the right and one 5 mm patient's side assistant trocar on the right  after infiltrating every site  with ropivacaine per protocol. The robot is docked on the left of the patient after positioning her in Trendelenburg. A monopolar scissor is inserted in arm #1 and a PK gyrus forcep is inserted in arm #2.  Preparation and docking is completed in 45 minutes.  Observation: anterior cul-de-sac reveals one 1 mm brown endometriotic lesion and thick adhesions between the bladder and the uterus. The uterus, posterior cul-de-sac, both tubes and ovaries are normal. Liver and appendix are normal.  We start on the right side by cauterizing the mesosalpynx , the right utero-ovarian ligament and the right round ligament . This is then sectioned with monopolar scissors. This gives [place] entry into the retroperitoneal space with an easy dissection of the anterior broad ligament. This was opened all the way to the left round ligament.   We then proceed with systematic dissection of the bladder  way from the anterior vaginal cuff which is easily identified with the KOH ring. The plane of dissection is very difficult to  identify but is eventually seen after filling the bladder with 200  cc of saline. We are able to dissect the bladder 2 cm below the KOH ring. We then opened the posterior right broad ligament all the way to the posterior KOH ring after identifying the full course of the right ureter.   Moving to the left side we cauterize the left round ligament  , the left utero-ovarian ligament and  the mesosalpinx in between. This pedicle is the sectionned. Entry into the retroperitoneal space allows [place] to complete dissection of the bladder on the left side and skeletonized the all uterine vessels. The left broad ligament is then dissected all the way to the posterior KOH ring after identifying the full course of the left ureter.   With pressure on the KOH ring and the bladder fully dissected below we are able to cauterize the uterine vessels on both sides at the level of the KOH ring.  The vaginal occluder is inflated and we proceed with a 360 colpotomy using an open monopolar scissors and freeing the uterus entirely with its 2 tubes.  The uterus is delivered vaginally with traction only. The vaginal occluder is reinserted in the vagina to maintain pneumoperitoneum.  Instruments are then modified for a suture cut in arm #1 and a long tip forcep in arm #2. We proceed with closure of the vaginal cuff with figure-of-eight stitches of 0 Vicryl. The needles are attached to the anterior abdominal wall for future removal. We irrigated profusely with warm saline and confirm a satisfactory hemostasis as well as 2 normal ureters with good mobility and no dilatation.The needles are removed via the 8 mm undocked trocar. Two 1/2 sheets of Interceed are positioned to cover the vaginal cuff.  All instruments are then removed and the robot is undocked. Console time: 1 hours and 28 minutes.  All trochars are removed under direct visualization after evacuating the pneumoperitoneum.  The fascia of the supraumbilical incision is closed with the previously placed pursestring suture of 0 Vicryl. All incisions are then closed with subcuticular suture of 3-0 Monocryl and Dermabond.  A speculum is inserted in the vagina to confirm a adequate closure of the vaginal cuff and good hemostasis.  Instrument and sponge count is complete x2. Estimated blood loss is minimal. The  procedure is well tolerated by the patient is taken to recovery room in a well and stable condition.  Specimen: Uterus and tubes weighing 63.9 g to pathology

## 2013-11-01 NOTE — Anesthesia Postprocedure Evaluation (Signed)
Anesthesia Post Note  Patient: Madeline Everett  Procedure(s) Performed: Procedure(s) (LRB): ROBOTIC ASSISTED TOTAL HYSTERECTOMY WITH BILATERAL SALPINOGECTOMY (Bilateral)  Anesthesia type: General  Patient location: PACU  Post pain: Pain level controlled  Post assessment: Post-op Vital signs reviewed  Last Vitals:  Filed Vitals:   11/01/13 1600  BP: 114/67  Pulse: 74  Temp:   Resp: 17    Post vital signs: Reviewed  Level of consciousness: sedated  Complications: No apparent anesthesia complications

## 2013-11-01 NOTE — Progress Notes (Signed)
Patient voided 50cc pink colored urine after foley was d/c'd

## 2013-11-01 NOTE — Progress Notes (Signed)
Patient stated voiding without difficulty, denies any nausea, tolerating regular diet and tolerated po pain med.  Discharge instructions reviewed and patient and significant other verbalized understanding of instructions.

## 2013-11-01 NOTE — Anesthesia Preprocedure Evaluation (Addendum)
Anesthesia Evaluation  Patient identified by MRN, date of birth, ID band Patient awake    Reviewed: Allergy & Precautions, H&P , Patient's Chart, lab work & pertinent test results, reviewed documented beta blocker date and time   History of Anesthesia Complications (+) PONV and history of anesthetic complications  Airway Mallampati: II TM Distance: >3 FB Neck ROM: full    Dental   Pulmonary  breath sounds clear to auscultation        Cardiovascular Exercise Tolerance: Good hypertension, + Valvular Problems/Murmurs Rhythm:regular Rate:Normal     Neuro/Psych  Headaches, PSYCHIATRIC DISORDERS Depression    GI/Hepatic GERD-  Controlled,  Endo/Other    Renal/GU      Musculoskeletal   Abdominal   Peds  Hematology   Anesthesia Other Findings   Reproductive/Obstetrics                          Anesthesia Physical Anesthesia Plan  ASA: II  Anesthesia Plan: General ETT   Post-op Pain Management:    Induction:   Airway Management Planned:   Additional Equipment:   Intra-op Plan:   Post-operative Plan:   Informed Consent: I have reviewed the patients History and Physical, chart, labs and discussed the procedure including the risks, benefits and alternatives for the proposed anesthesia with the patient or authorized representative who has indicated his/her understanding and acceptance.   Dental Advisory Given  Plan Discussed with: CRNA and Surgeon  Anesthesia Plan Comments:         Anesthesia Quick Evaluation

## 2013-11-01 NOTE — Interval H&P Note (Signed)
History and Physical Interval Note:  11/01/2013 12:05 PM  Madeline Everett  has presented today for surgery, with the diagnosis of Endometriosis  The various methods of treatment have been discussed with the patient and family. After consideration of risks, benefits and other options for treatment, the patient has consented to  Procedure(s): ROBOTIC ASSISTED TOTAL HYSTERECTOMY (N/A) as a surgical intervention .  The patient's history has been reviewed, patient examined, no change in status, stable for surgery.  I have reviewed the patient's chart and labs.  Questions were answered to the patient's satisfaction.     Sharifa Bucholz A

## 2013-11-01 NOTE — Progress Notes (Signed)
200 cc's of water backflow into bladder. Catheter removed at 1935.

## 2013-11-01 NOTE — H&P (Signed)
Reason for admission:  37 yo female P 2-1-0-2 presents for hysterectomy because of endometriosis, pelvic pain and dysmenorrhea. In the past the patient had heavy menses that occurred every 2 weeks and would last for 10 days. She would change her pad every 2 hours and experience severe cramping. Fortunately, she has found relief from her bleeding with oral contraception however, her pelvic pain remained. She reports deep dyspareunia and constipation but no urinary tract symptoms, inter-menstrual bleeding or cramping (while on continuous oral contraception). A pelvic ultrasound, May 2013 showed a uterus: 5.71 x 4.52 x 3.46 cm, endometrium: 0.212, right ovary: 2.21 x 1.61 x 1.33 cm and left ovary: 1.93 x 1.40 x 1.01 cm. The patient has had five laparoscopic surgeries to address her endometriosis and pelvic pain and had been on other hormonal therapies to manage her bleeding though the continuous oral contraceptives have been the most effective. After reviewing both medical and surgical management options for management of her condition, the patient has decided to proceed with hysterectomy.    Past Medical History  GI-Reflux/Stomach Ulcers: Y - reflux Psych-Depression: Y - depression Cardiac-High Blood Pressure: N - Postpartum Notes: Anal Fissure  Constipation  Herniated Disc  Depression [311]  Acid reflux [530.81]  Complication of anesthesia [995.22] - nausea & vomiting and difficult time waking up  Infertility, female [628.9]  Endometriosis [617.9]  Gestational hypertension [642.30]  Surgical History  GYN-Laparoscopy OB-Cesarean Section - x2 Derm-Skin Surgery - 2010 - Dermoid Cyst Surgery-Other - 2008 - Herniated Disc  Medications  bupropion HCl SR 150 mg tablet,sustained-release fluoxetine 10 mg tablet ibuprofen lisinopril 5 mg tablet Minastrin 24 Fe 1 mg-20 mcg (24)/75 mg (4) chewable tablet Chew 1 tablet(s) every day by oral route. ProAir HFA 90 mcg/actuation aerosol inhaler USE 2  PUFFS AS NEEDED EVERY 4 HRS INHALATION topiramate 25 mg tablet  Allergies AMOXICILLIN CODEINE LACTOSE PERCOCET  Past Pregnancies Date # Fetuses GA Wks Labor Length Birth Weight Sex Delivery Type Outcome Anesthesia Delivery Place Preterm Labor Notes Source 11/1998 1 34  5 lbs.  M Cesarean Premature Birth [facility] CC005_ [facility] [place] Mouse Had Dx of Diaphragmatic hernia -detected @ 4 months preg Was told 60-70% chance of survival.Pt developed toxemia @ 34 weeks induced to 2 cm to emergency C/S. Lived 3 days d/t brain hemorrhage historical 12/2004 1 39  7 lbs. 1 oz. M VBAC Full Term Birth     historical 10/26/2009 1 39  6 lbs.  F C- Section  [facility]-Spinal CC003_ [facility]IP  Morrie Sheldon historical Obstetric History Reviewed Obstetric History TOTAL FULL PRE AB. I AB. S ECTOPICS MULTIPLE LIVING 3 2 1     2   GYN History Age at Menarche: 64. Date of LMP: 05/04/2013. Frequency of cycle: Monthly. LMP: Definite. Menses Monthly: Y. Duration of flow: lasts 4 days. Flow: Light. Dysmenorrhea: N. Intermenstrual bleeding (AUB): N. Date of Last Pap Smear: 04/05/2012 (Notes: WNL). History of abnormal PAP: N. Any History of Abnormal Pap Smears: N. History of Human Papillomavirus (HPV): N. Current Birth Control Method: BCPs. HPV Vaccine: N. Sexually Active?: Y. Self Breast Exams?: Y. History of sexual abuse: N. Hormone Replacement Therapy: N. Most Recent Bone Density: (Notes: n/a). Most Recent Mammogram: (Notes: n/a). History of Breast Problems: N. History of Cervical Dysplasia: N. History of Endometriosis: Y. History of Fibroids: N. History of Infertility: Y.  Family History Mother - Diabetes mellitus Father - Diabetes mellitus   - Hypertensive disorder Maternal Uncle - Heart disease Maternal Grandmother -  Diabetes mellitus   - Hypertensive disorder   - Heart disease  Social History Smoking Status: Never smoker. Exercise level:  Occasional. Non-smoker. Diet: Regular. Alcohol intake: Occasional. Caffeine intake: Occasional. Illicit drugs: no. Country of birth: [place]. Passive smoke exposure?: N. Ethnic Background: Caucasian. Sexual orientation: Heterosexual. Marital status: Married. Sexually active?: Y. History of domestic violence: N. Are you currently employed?: Y. General stress level: High. Is blood transfusion acceptable in an emergency?: Y. Performs monthly self-breast exam: Y. Seat belts used routinely: Y.  Vitals: Wt: 160.5 lbs 10/08/2013 04:00 pm Ht: 5 ft 4 in 10/08/2013 03:55 pm BMI: 27.5 10/08/2013 04:00 pm BP: 108/70 sitting R arm 10/08/2013 04:01 pm Pulse: 64 bpm regular 10/08/2013 04:09 pm RR: 12 10/08/2013 04:09 pm T: 99.2 F oral 10/08/2013 04:09 pm LMP: 06/03/2013 10/08/2013 04:01 pm  Physical Exam Patient is a 37 year old female.  Chaperone: Chaperone: present.  Constitutional: General Appearance: healthy-appearing, well-nourished, and well-developed.  Psychiatric: Orientation: to time, place, and person. Mood and Affect: normal mood and affect and active and alert.  Skin: Appearance: no rashes or lesions.  Neck: Neck: supple, FROM, trachea midline, and no masses. Thyroid: no enlargement or nodules and non-tender.  Lungs: Respiratory Effort: no intercostal retractions or accessory muscle usage. Auscultation: no wheezing, rales/crackles, or rhonchi and clear to auscultation.  Cardiovascular: Auscultation: RRR and no murmur. Peripheral Vascular: no varicosities, LLE edema, RLE edema, calf tenderness, or palpable cords and pedal pulses intact.  Abdomen: Auscultation/Inspection/Palpation: no hepatomegaly, splenomegaly, masses, or CVA tenderness and soft, non-distended, normal bowel sounds, and tenderness; diffusely tender, primarily in both lower quadrants, no guarding or rebound. Hernia: none palpated.  Female Genitalia: Vulva: no masses, atrophy, or lesions. Vagina: no erythema,  cystocele, rectocele, abnormal vaginal discharge, or vesicle(s) or ulcers and tenderness. Cervix: no discharge or cervical motion tenderness and grossly normal. Uterus: normal size and shape and midline, mobile, no uterine prolapse, and tender. Bladder/Urethra: no urethral discharge or mass and normal meatus, bladder non distended, and Urethra well supported. Adnexa/Parametria: no parametrial tenderness or mass and no adnexal tenderness or ovarian mass.  Lymph Nodes: Palpation: non tender submandibular nodes, axillary nodes, or inguinal nodes.  Rectal Exam: Rectum: normal perianal skin.  Assessment and plan:  Endometriosis of pelvis admitted for robotically-assisted total hysterectomy with bilateral salpyngectomy  Robotically-assisted hysterectomy was reviewed with the patient.  The patient was given the indication for her procedure(s) along with the risks, which include but are not limited to, reaction to anesthesia, damage to adjacent organs, Infection, excessive bleeding, formation of scar tissue, early menopause, pelvic prolapse, need to leave cervix in place ( with possible subsequent bleeding) and the possible need for an open abdominal incision. Regarding possible supra-cervical hysterectomy, the patient was advised of the risks of morcellation (as outlined for patient's who are having surgery for fibroids). She was further advised that she will experience transient post operative facial edema, that her hospital stay is expected to be 0-2 days, she should be able to return to her usual activities within 2-3 weeks (except intercourse to be delayed until 6 weeks) and that the robotic approach to her surgery requires more time to perform than an open abdominal approach. Patient was given the Miralax bowel prep to be completed 24 hours prior to procedure. She verbalized understanding of these risks and pre-operative instructions and has consented to proceed with   The patient is also aware that  should morcellation of the uterus be performed, and she is later found to have a uterine sarcoma (  type of cancer) that the morcellation procedure may increase her risk of spreading the cancerous tissue that may in turn worsen a patent's chance of long-term survival.  According to the FDA, sarcoma is estimated to occur 1 in 350 women undergoing surgery for fibroids.

## 2013-11-02 ENCOUNTER — Encounter (HOSPITAL_COMMUNITY): Payer: Self-pay | Admitting: Obstetrics and Gynecology

## 2013-11-05 NOTE — Discharge Summary (Signed)
Physician Discharge Summary  Patient ID: Madeline Everett MRN: [id number] DOB/AGE: [birth year] 37 y.o.  Admit date: 11/01/2013 Discharge date: 11/05/2013   Discharge Diagnoses:  Chronic Pelvic Pain and Endometriosis Active Problems:   Pelvic pain   Operation: Robot Assisted Total Laparoscopic Hysterectomy with Bilateral Salpingectomy  Discharge Condition:  Good  Hospital Course: On the date of admission the patient underwent the aforementioned procedures and tolerated them well.  By the evening of the operative day the patient had resumed bowel and bladder function and achieved good analgesia with oral medications and was therefore deemed ready to discharge home.   Disposition: 01-Home or Self Care  Discharge Medications:    Medication List    STOP taking these medications       Norethin Ace-Eth Estrad-FE 1-20 MG-MCG(24) Chew  Commonly known as:  MINASTRIN 24 FE     Norethindrone Acetate-Ethinyl Estrad-FE 1-20 MG-MCG(24) tablet  Commonly known as:  LOESTRIN 24 FE      TAKE these medications       buPROPion 150 MG 12 hr tablet  Commonly known as:  WELLBUTRIN SR  Take 150 mg by mouth 2 (two) times daily.     FLUoxetine 10 MG tablet  Commonly known as:  PROZAC  Take 10 mg by mouth daily.     fluticasone 50 MCG/ACT nasal spray  Commonly known as:  FLONASE  Place 2 sprays into both nostrils daily.     ibuprofen 600 MG tablet  Commonly known as:  ADVIL,MOTRIN  1 po pc every 6 hours x 5 days then prn     ibuprofen 200 MG tablet  Commonly known as:  ADVIL,MOTRIN  Take 400 mg by mouth every 6 (six) hours as needed for mild pain.     lisinopril 5 MG tablet  Commonly known as:  PRINIVIL,ZESTRIL  Take 5 mg by mouth daily.     ondansetron 4 MG tablet  Commonly known as:  ZOFRAN  Take 1 tablet (4 mg total) by mouth every 8 (eight) hours as needed for nausea or vomiting.     pseudoephedrine 30 MG tablet  Commonly known as:  SUDAFED  Take 30 mg by mouth every 6 (six)  hours as needed for congestion.     ranitidine 150 MG tablet  Commonly known as:  ZANTAC  Take 150 mg by mouth 2 (two) times daily.     topiramate 25 MG tablet  Commonly known as:  TOPAMAX  Take 75 mg by mouth daily.     traMADol 50 MG tablet  Commonly known as:  ULTRAM  Take 2 tablets (100 mg total) by mouth every 6 (six) hours as needed for moderate pain.     zonisamide 100 MG capsule  Commonly known as:  ZONEGRAN  Take 100 mg by mouth daily.         Follow-up: Dr. Dois Davenport A. Rivard,  November 19, 2013 at 9:15 a.m.  SignedHenreitta Leber, PA-C 11/05/2013, 9:52 AM

## 2014-01-17 ENCOUNTER — Telehealth: Payer: Self-pay | Admitting: Cardiology

## 2014-01-17 NOTE — Telephone Encounter (Signed)
Please let patient's psychiatrist know that she has a history of bicuspid AV and history of SVT.  No mitral valve prolapse.  Any stimulant medications could trigger SVT.

## 2014-01-17 NOTE — Telephone Encounter (Signed)
Printed and faxed to [employer] at [phone]

## 2014-02-07 ENCOUNTER — Ambulatory Visit: Payer: Managed Care, Other (non HMO) | Admitting: Cardiology

## 2014-02-18 ENCOUNTER — Encounter: Payer: Self-pay | Admitting: General Surgery

## 2014-02-19 ENCOUNTER — Other Ambulatory Visit: Payer: Self-pay | Admitting: Gastroenterology

## 2014-02-19 ENCOUNTER — Ambulatory Visit
Admission: RE | Admit: 2014-02-19 | Discharge: 2014-02-19 | Disposition: A | Discharge status: Home / Self Care | Payer: 59 | Source: Ambulatory Visit | Attending: Gastroenterology | Admitting: Gastroenterology

## 2014-02-19 ENCOUNTER — Ambulatory Visit: Payer: 59 | Admitting: Nurse Practitioner

## 2014-02-19 DIAGNOSIS — R197 Diarrhea, unspecified: Secondary | ICD-10-CM

## 2014-02-19 DIAGNOSIS — R109 Unspecified abdominal pain: Secondary | ICD-10-CM

## 2014-02-19 DIAGNOSIS — K5900 Constipation, unspecified: Secondary | ICD-10-CM

## 2014-02-21 ENCOUNTER — Encounter: Payer: Self-pay | Admitting: Cardiology

## 2014-02-21 ENCOUNTER — Ambulatory Visit (INDEPENDENT_AMBULATORY_CARE_PROVIDER_SITE_OTHER): Payer: 59 | Admitting: Cardiology

## 2014-02-21 VITALS — BP 134/82 | HR 81 | Ht 64.0 in | Wt 156.8 lb

## 2014-02-21 DIAGNOSIS — I498 Other specified cardiac arrhythmias: Secondary | ICD-10-CM

## 2014-02-21 DIAGNOSIS — Q231 Congenital insufficiency of aortic valve: Secondary | ICD-10-CM

## 2014-02-21 DIAGNOSIS — O165 Unspecified maternal hypertension, complicating the puerperium: Secondary | ICD-10-CM | POA: Insufficient documentation

## 2014-02-21 DIAGNOSIS — I10 Essential (primary) hypertension: Secondary | ICD-10-CM

## 2014-02-21 DIAGNOSIS — I471 Supraventricular tachycardia: Secondary | ICD-10-CM | POA: Insufficient documentation

## 2014-02-21 NOTE — Patient Instructions (Signed)
Your physician recommends that you continue on your current medications as directed. Please refer to the Current Medication list given to you today.  Your physician has requested that you have an echocardiogram. Echocardiography is a painless test that uses sound waves to create images of your heart. It provides your doctor with information about the size and shape of your heart and how well your heart's chambers and valves are working. This procedure takes approximately one hour. There are no restrictions for this procedure.  Your physician wants you to follow-up in: 6 months with Dr Mallie Snooks will receive a reminder letter in the mail two months in advance. If you don't receive a letter, please call our office to schedule the follow-up appointment.

## 2014-02-21 NOTE — Progress Notes (Signed)
[name], [name], [name]  [address] Phone: [phone] Fax:  [phone]  Date:  02/21/2014   ID:  Madeline Everett, DOB October 07, 1976, MRN [id number]   PCP:  Vidal Schwalbe, MD  Cardiologist:  Fransico Him, MD     History of Present Illness: Madeline Everett is a 38 y.o. female with a history of HTN, SVT and bicuspid AV presents today for followup.  She is doing well.  She denies any chest pain, SOB, DOE, LE edema, dizziness, palpitations or syncope.  Since I saw her last she has had a TAH due to endometriosis but kept her ovaries.  She is now off birth control and off her BP meds.    Wt Readings from Last 3 Encounters:  02/21/14 156 lb 12.8 oz (71.124 kg)  11/01/13 154 lb 0.8 oz (69.877 kg)  11/01/13 154 lb 0.8 oz (69.877 kg)     Past Medical History  Diagnosis Date  . H/O candidiasis   . H/O varicella     childhood  . Depression     crying spells for a year due to stress of  infertility  . Acid reflux   . Complication of anesthesia     nausea & vomiting and difficult time waking up  . Infertility, female   . Herniated disc 2008  . UTI (lower urinary tract infection)   . FH: thyroid condition   . Yeast infection   . H/O dyspareunia   . Pelvic pain   . Endometriosis   . H/O constipation   . PONV (postoperative nausea and vomiting)     needs Scop patch  . Heart murmur     congential bicuspid aortic valve, mitral valve prolapse  . Headache(784.0)     migraine  . Aortic valve disorder     Bicuspid aortic valve  . Atypical chest pain   . SVT (supraventricular tachycardia)   . IBS (irritable bowel syndrome)   . Deviated septum     Dr Ruby Cola  . Compound nevus 01/2012    dysplastic, melanocytic atypia  . Postpartum hypertension   . Gestational hypertension   . HTN (hypertension)     Current Outpatient Prescriptions  Medication Sig Dispense Refill  . cyclobenzaprine (FLEXERIL) 10 MG tablet Take 10 mg by mouth 3 (three) times daily as needed  for muscle spasms.      . DULoxetine (CYMBALTA) 60 MG capsule Take 60 mg by mouth daily.      . fluticasone (FLONASE) 50 MCG/ACT nasal spray Place 2 sprays into both nostrils daily.      Marland Kitchen ibuprofen (ADVIL,MOTRIN) 200 MG tablet Take 400 mg by mouth every 6 (six) hours as needed for mild pain.      Marland Kitchen ondansetron (ZOFRAN) 4 MG tablet Take 1 tablet (4 mg total) by mouth every 8 (eight) hours as needed for nausea or vomiting.  20 tablet  0  . pseudoephedrine (SUDAFED) 30 MG tablet Take 30 mg by mouth every 6 (six) hours as needed for congestion.      . ranitidine (ZANTAC) 150 MG tablet Take 150 mg by mouth 2 (two) times daily.      Marland Kitchen topiramate (TOPAMAX) 25 MG tablet Take 75 mg by mouth daily.       No current facility-administered medications for this visit.    Allergies:    Allergies  Allergen Reactions  . Codeine   . Guaifenesin & Derivatives     GI  . Lactose Intolerance (  Gi)   . Paxil [Paroxetine Hcl]   . Percocet [Oxycodone-Acetaminophen]   . Prozac [Fluoxetine Hcl]     Headaches  . Amoxicillin   . Penicillins Rash  . Vicodin [Hydrocodone-Acetaminophen] Nausea Only    Social History:  The patient  reports that she has never smoked. She has never used smokeless tobacco. She reports that she does not drink alcohol or use illicit drugs.   Family History:  The patient's family history includes Diabetes in her father, maternal grandmother, and mother; Heart disease in her maternal grandmother and maternal uncle; Hypertension in her father and maternal grandmother.   ROS:  Please see the history of present illness.      All other systems reviewed and negative.   PHYSICAL EXAM: VS:  BP 134/82  Pulse 81  Ht 5\' 4"  (1.626 m)  Wt 156 lb 12.8 oz (71.124 kg)  BMI 26.90 kg/m2 Well nourished, well developed, in no acute distress HEENT: normal Neck: no JVD Cardiac:  normal S1, S2; RRR; no murmur Lungs:  clear to auscultation bilaterally, no wheezing, rhonchi or rales Abd: soft,  nontender, no hepatomegaly Ext: no edema Skin: warm and dry Neuro:  CNs 2-12 intact, no focal abnormalities noted  EKG:     NSR with no ST changes  ASSESSMENT AND PLAN:  1. HTN - well controlled off BP meds 2. SVT - no reoccurrence 3. Bicuspid AV - recheck 2D echo to assess AV  Followup with me in 1 year  Signed, Fransico Him, MD 02/21/2014 11:05 AM

## 2014-03-12 ENCOUNTER — Ambulatory Visit (HOSPITAL_COMMUNITY): Payer: 59 | Attending: Cardiology | Admitting: Radiology

## 2014-03-12 DIAGNOSIS — Q231 Congenital insufficiency of aortic valve: Secondary | ICD-10-CM | POA: Insufficient documentation

## 2014-03-12 NOTE — Progress Notes (Signed)
Echocardiogram performed.  

## 2014-03-15 ENCOUNTER — Other Ambulatory Visit: Payer: Self-pay | Admitting: General Surgery

## 2014-03-17 NOTE — Progress Notes (Signed)
Dilated cardiomyopathy

## 2014-03-25 ENCOUNTER — Telehealth: Payer: Self-pay | Admitting: Cardiology

## 2014-03-25 ENCOUNTER — Other Ambulatory Visit: Payer: Self-pay | Admitting: General Surgery

## 2014-03-25 DIAGNOSIS — I420 Dilated cardiomyopathy: Secondary | ICD-10-CM

## 2014-03-25 NOTE — Telephone Encounter (Signed)
Dilated Cardiomyopathy

## 2014-03-25 NOTE — Telephone Encounter (Signed)
New message           Pt would like to know if she still needs a stress test. Pt also has questions for the nurse.

## 2014-03-25 NOTE — Telephone Encounter (Signed)
Dr Radford Pax you had requested a Stress Myoview... I had asked for a Dx code. I have not heard back can you let me know which code?

## 2014-03-25 NOTE — Telephone Encounter (Signed)
Pt is aware. Order put in [facility] for pt and went over instructions with pt for procedure. Pt wants an early appt

## 2014-04-11 ENCOUNTER — Ambulatory Visit (HOSPITAL_COMMUNITY): Payer: 59 | Attending: Cardiovascular Disease | Admitting: Radiology

## 2014-04-11 VITALS — BP 122/89 | Ht 64.0 in | Wt 156.0 lb

## 2014-04-11 DIAGNOSIS — R0609 Other forms of dyspnea: Secondary | ICD-10-CM | POA: Insufficient documentation

## 2014-04-11 DIAGNOSIS — J45909 Unspecified asthma, uncomplicated: Secondary | ICD-10-CM | POA: Insufficient documentation

## 2014-04-11 DIAGNOSIS — I059 Rheumatic mitral valve disease, unspecified: Secondary | ICD-10-CM | POA: Insufficient documentation

## 2014-04-11 DIAGNOSIS — R0989 Other specified symptoms and signs involving the circulatory and respiratory systems: Principal | ICD-10-CM | POA: Insufficient documentation

## 2014-04-11 DIAGNOSIS — I10 Essential (primary) hypertension: Secondary | ICD-10-CM | POA: Insufficient documentation

## 2014-04-11 DIAGNOSIS — I420 Dilated cardiomyopathy: Secondary | ICD-10-CM

## 2014-04-11 MED ORDER — TECHNETIUM TC 99M SESTAMIBI GENERIC - CARDIOLITE
10.8000 | Freq: Once | INTRAVENOUS | Status: AC | PRN
  Administered 2014-04-11: 11 via INTRAVENOUS

## 2014-04-11 MED ORDER — TECHNETIUM TC 99M SESTAMIBI GENERIC - CARDIOLITE
33.0000 | Freq: Once | INTRAVENOUS | Status: AC | PRN
  Administered 2014-04-11: 33 via INTRAVENOUS

## 2014-04-11 NOTE — Progress Notes (Signed)
[facility] [facility] NUCLEAR MED [address] [place], [place] [address] [phone]    Cardiology Nuclear Med Study  Madeline Everett is a 38 y.o. female     MRN : [id number]     DOB: September 27, 1976  Procedure Date: 04/11/2014  Nuclear Med Background Indication for Stress Test:  Evaluation for Ischemia, and Recent Echo with mildly decreased LVF History:  Asthma and MVP, 03/12/14 ECHO: EF: 45-50%Congenital Aortic Valve insufficiency  Bicuspid AV  No H/O CAD Cardiac Risk Factors: Hypertension  Symptoms:  DOE   Nuclear Pre-Procedure Caffeine/Decaff Intake:  None > 12 hrs NPO After: 10:00pm   Lungs:  clear O2 Sat: 100% on room air. IV 0.9% NS with Angio Cath:  22g  IV Site: R Antecubital x 1, tolerated well IV Started by:  Irven Baltimore, RN  Chest Size (in):  36 Cup Size: DD  Height: 5\' 4"  (1.626 m)  Weight:  156 lb (70.761 kg)  BMI:  Body mass index is 26.76 kg/(m^2). Tech Comments:  No medications today per patient. Irven Baltimore, RN.    Nuclear Med Study 1 or 2 day study: 1 day  Stress Test Type:  Stress  Reading MD: N/A  Order Authorizing Provider:  Fransico Him, MD  Resting Radionuclide: Technetium 66m Sestamibi  Resting Radionuclide Dose: 10.8 mCi   Stress Radionuclide:  Technetium 2m Sestamibi  Stress Radionuclide Dose: 33.0 mCi           Stress Protocol Rest HR: 85 Stress HR: 164  Rest BP: 122/89 Stress BP: 145/69  Exercise Time (min): 7:00 METS: 8.50   Predicted Max HR: 184 bpm % Max HR: 89.13 bpm Rate Pressure Product: 23780   Dose of Adenosine (mg):  n/a Dose of Lexiscan: n/a mg  Dose of Atropine (mg): n/a Dose of Dobutamine: n/a mcg/kg/min (at max HR)  Stress Test Technologist: Perrin Maltese, EMT-P  Nuclear Technologist:  Charlton Amor, CNMT     Rest Procedure:  Myocardial perfusion imaging was performed at rest 45 minutes following the intravenous administration of Technetium 78m Sestamibi. Rest ECG: NSR - Normal EKG  Stress Procedure:  The  patient exercised on the treadmill utilizing the Bruce Protocol for 7:00 minutes. The patient stopped due to doe and denied any chest pain.  Technetium 39m Sestamibi was injected at peak exercise and myocardial perfusion imaging was performed after a brief delay. Stress ECG: No significant change from baseline ECG  QPS Raw Data Images:  Normal; no motion artifact; mild breast attenuation artifact. Stress Images:  Normal homogeneous uptake in all areas of the myocardium. Rest Images:  Normal homogeneous uptake in all areas of the myocardium. Subtraction (SDS):  No evidence of ischemia. Transient Ischemic Dilatation (Normal <1.22):  0.97 Lung/Heart Ratio (Normal <0.45):  0.38  Quantitative Gated Spect Images QGS EDV:  n/a ml QGS ESV:  n/a ml  Impression Exercise Capacity:  Good exercise capacity. BP Response:  Normal blood pressure response. Clinical Symptoms:  No significant symptoms noted. ECG Impression:  No significant ST segment change suggestive of ischemia. Comparison with Prior Nuclear Study: No previous nuclear study performed  Overall Impression:  Low risk stress nuclear study with mild breast attenuation artifact..  LV Ejection Fraction: study not gated.     Sanda Klein, MD, [facility] [phone] office [phone] pager

## 2014-04-16 ENCOUNTER — Telehealth: Payer: Self-pay | Admitting: Cardiology

## 2014-04-16 NOTE — Telephone Encounter (Signed)
New message ° ° ° ° °Want stress test results °

## 2014-04-16 NOTE — Telephone Encounter (Signed)
Called Madeline Everett and LVM asking to look into this.

## 2014-04-16 NOTE — Telephone Encounter (Signed)
I do not have the results yet - please check with Mariann Laster in [facility] to see when they will be done

## 2014-04-16 NOTE — Telephone Encounter (Signed)
I do not have results from Stress test. TO Dr Radford Pax have you received pts stress test yet?

## 2014-04-17 NOTE — Telephone Encounter (Signed)
Follow up     Pt want stress test results.  She want to join an exercise class but need the results first.  It is time sensitive so she can get a certain price.  Please call today if possible

## 2014-04-17 NOTE — Telephone Encounter (Signed)
Pt is aware that there was an issue with the scanner last week when she was here for the study. They were able to get her study and it is ready to be read. I told her I would call her tomorrow to give her the results.   To Dr Radford Pax to make aware of the issue.

## 2014-05-03 ENCOUNTER — Telehealth: Payer: Self-pay | Admitting: Cardiology

## 2014-05-03 DIAGNOSIS — I5041 Acute combined systolic (congestive) and diastolic (congestive) heart failure: Secondary | ICD-10-CM

## 2014-05-03 DIAGNOSIS — I10 Essential (primary) hypertension: Secondary | ICD-10-CM

## 2014-05-03 NOTE — Telephone Encounter (Signed)
Please let patient know that we still do not have a clear cut etiology why her EF appeared reduced on echo.  Please set up a cardiac gated MRI to assess EF to make sure echo was accurate and have Dr. Johnsie Cancel or Dr. Meda Coffee read

## 2014-05-03 NOTE — Telephone Encounter (Signed)
To traige.

## 2014-05-06 NOTE — Telephone Encounter (Signed)
Cardiac MRI and BMET placed in epic. Staff message sent to [facility] scheduler to scheduled test and BMET.

## 2014-05-06 NOTE — Telephone Encounter (Signed)
Pt is aware of MD's recommendations. A Cardiac MRI placed in EPIC . Pt is aware that scheduler will call her to scheduled test.

## 2014-05-06 NOTE — Addendum Note (Signed)
Addended by: Carollee Sires L on: 05/06/2014 01:03 PM   Modules accepted: Orders

## 2014-05-08 ENCOUNTER — Encounter: Payer: Self-pay | Admitting: Cardiology

## 2014-05-14 ENCOUNTER — Telehealth: Payer: Self-pay | Admitting: Cardiology

## 2014-05-14 NOTE — Telephone Encounter (Signed)
Pt is stressing because she already has a large bill of over 1300 dollars that she owes to [facility]. She found out it was going to be another 500 dollars to add to her bill if she has the MRI done. She wants to make sure Dr Radford Pax thinks it is 100 % necessary and the reasoning why before the test so she can decide if she can afford to do it. To Dr Radford Pax to advise.

## 2014-05-14 NOTE — Telephone Encounter (Signed)
Yes because we do not know why her EF has dropped

## 2014-05-14 NOTE — Telephone Encounter (Signed)
New message     Talk to a nurse regarding the MRI

## 2014-05-15 NOTE — Telephone Encounter (Signed)
Pt made aware

## 2014-05-20 ENCOUNTER — Encounter: Payer: Self-pay | Admitting: General Surgery

## 2014-05-20 ENCOUNTER — Ambulatory Visit (HOSPITAL_COMMUNITY)
Admission: RE | Admit: 2014-05-20 | Discharge: 2014-05-20 | Disposition: A | Discharge status: Home / Self Care | Payer: 59 | Source: Ambulatory Visit | Attending: Cardiology | Admitting: Cardiology

## 2014-05-20 DIAGNOSIS — I10 Essential (primary) hypertension: Secondary | ICD-10-CM

## 2014-05-20 DIAGNOSIS — I5041 Acute combined systolic (congestive) and diastolic (congestive) heart failure: Secondary | ICD-10-CM

## 2014-05-20 DIAGNOSIS — Q231 Congenital insufficiency of aortic valve: Secondary | ICD-10-CM | POA: Insufficient documentation

## 2014-05-20 LAB — CREATININE, SERUM
CREATININE: 0.6 mg/dL (ref 0.50–1.10)
GFR calc Af Amer: >90 mL/min (ref >90)
GFR calc non Af Amer: >90 mL/min (ref >90)

## 2014-05-20 MED ORDER — GADOBENATE DIMEGLUMINE 529 MG/ML IV SOLN
23.0000 mL | Freq: Once | INTRAVENOUS | Status: AC
  Administered 2014-05-20: 23 mL via INTRAVENOUS

## 2014-08-21 ENCOUNTER — Telehealth: Payer: Self-pay | Admitting: Cardiology

## 2014-08-21 NOTE — Telephone Encounter (Signed)
Pt is aware.  

## 2014-08-21 NOTE — Telephone Encounter (Signed)
New Message  Pt called requests a call back to ask if it is ok to take Cymbalta 20 mg. Please call back to discuss.

## 2014-08-21 NOTE — Telephone Encounter (Signed)
TO Dr Radford Pax to advise. Originally we had she was taking 60 mg daily of Cymbalta.

## 2014-08-21 NOTE — Telephone Encounter (Signed)
She will need to discuss that with her PCP since I did not prescribe it

## 2014-09-30 ENCOUNTER — Encounter: Payer: Self-pay | Admitting: Cardiology

## 2014-10-14 ENCOUNTER — Ambulatory Visit (INDEPENDENT_AMBULATORY_CARE_PROVIDER_SITE_OTHER): Payer: 59

## 2014-10-14 ENCOUNTER — Ambulatory Visit (INDEPENDENT_AMBULATORY_CARE_PROVIDER_SITE_OTHER): Payer: 59 | Admitting: Podiatry

## 2014-10-14 ENCOUNTER — Encounter: Payer: Self-pay | Admitting: Podiatry

## 2014-10-14 DIAGNOSIS — M779 Enthesopathy, unspecified: Secondary | ICD-10-CM

## 2014-10-14 DIAGNOSIS — S93402A Sprain of unspecified ligament of left ankle, initial encounter: Secondary | ICD-10-CM

## 2014-10-14 MED ORDER — DICLOFENAC SODIUM 75 MG PO TBEC: 75.0000 mg | DELAYED_RELEASE_TABLET | Freq: Two times a day (BID) | ORAL | Status: DC

## 2014-10-14 MED ORDER — TRIAMCINOLONE ACETONIDE 10 MG/ML IJ SUSP
10.0000 mg | Freq: Once | INTRAMUSCULAR | Status: AC
  Administered 2014-10-14: 10 mg

## 2014-10-15 NOTE — Progress Notes (Signed)
Subjective:     Patient ID: Madeline Everett, female   DOB: 01-Jan-1976, 38 y.o.   MRN: [id number]  HPIpatient presents stating I been having a lot of pain in the outside of my left foot after injuring it when doing a 5K-type brace. States it's been getting more aggravating over this period of time and I also have flat feet which causes me to develop chronic discomfort and I think I'm developing bunions   Review of Systems  All other systems reviewed and are negative.      Objective:   Physical Exam  Constitutional: She is oriented to person, place, and time.  Cardiovascular: Intact distal pulses.   Musculoskeletal: Normal range of motion.  Neurological: She is oriented to person, place, and time.  Skin: Skin is warm.  Nursing note and vitals reviewed.  neurovascular status intact with muscle strength adequate and range of motion of the subtalar midtarsal joint within normal limits. Patient's found to have good digital perfusion is well oriented 3 with no equinus condition noted and was found to have discomfort in the sinus tarsi left and also in the outside of the left ankle around the peroneal tendon group with inflammation but no indication of muscle strength loss     Assessment:     Probable trauma to the left foot secondary to injury from activity level    Plan:     Reviewed condition and at this time did a careful sinus tarsi injection left 3 mg Kenalog 5 mg Xylocaine and scanned for custom orthotics to reduce stress against the feet. Reappoint when orthotics are ready or earlier if symptoms persist

## 2014-11-08 ENCOUNTER — Encounter: Payer: Self-pay | Admitting: Podiatry

## 2014-11-08 ENCOUNTER — Ambulatory Visit: Payer: 59 | Admitting: Podiatry

## 2014-11-08 DIAGNOSIS — M779 Enthesopathy, unspecified: Secondary | ICD-10-CM

## 2014-11-08 NOTE — Progress Notes (Signed)
Pt is here to PUO 

## 2014-11-08 NOTE — Patient Instructions (Signed)

## 2015-06-26 ENCOUNTER — Other Ambulatory Visit: Payer: Self-pay | Admitting: Family Medicine

## 2015-06-26 DIAGNOSIS — R109 Unspecified abdominal pain: Secondary | ICD-10-CM

## 2015-06-27 ENCOUNTER — Ambulatory Visit
Admission: RE | Admit: 2015-06-27 | Discharge: 2015-06-27 | Disposition: A | Discharge status: Home / Self Care | Payer: 59 | Source: Ambulatory Visit | Attending: Family Medicine | Admitting: Family Medicine

## 2015-06-27 DIAGNOSIS — R109 Unspecified abdominal pain: Secondary | ICD-10-CM

## 2015-06-27 MED ORDER — IOPAMIDOL (ISOVUE-300) INJECTION 61%
100.0000 mL | Freq: Once | INTRAVENOUS | Status: AC | PRN
  Administered 2015-06-27: 100 mL via INTRAVENOUS

## 2015-08-04 NOTE — Progress Notes (Signed)
Cardiology Office Note   Date:  08/05/2015   ID:  Madeline Everett, DOB Apr 30, 1976, MRN [id number]  PCP:  Vidal Schwalbe, MD    Chief Complaint  Patient presents with  . SVT      History of Present Illness: Madeline Everett is a 39 y.o. female with a history of HTN, SVT and bicuspid AV presents today for followup. She is doing well. She denies any chest pain, SOB, DOE, LE edema, dizziness or syncope. She occasionally notices a skipped beat.    Past Medical History  Diagnosis Date  . H/O candidiasis   . H/O varicella     childhood  . Depression     crying spells for a year due to stress of  infertility  . Acid reflux   . Complication of anesthesia     nausea & vomiting and difficult time waking up  . Infertility, female   . Herniated disc 2008  . UTI (lower urinary tract infection)   . FH: thyroid condition   . Yeast infection   . H/O dyspareunia   . Pelvic pain   . Endometriosis   . H/O constipation   . PONV (postoperative nausea and vomiting)     needs Scop patch  . Heart murmur     congential bicuspid aortic valve, mitral valve prolapse  . Headache(784.0)     migraine  . Aortic valve disorder     Bicuspid aortic valve  . Atypical chest pain   . SVT (supraventricular tachycardia)   . IBS (irritable bowel syndrome)   . Deviated septum     Dr Ruby Cola  . Compound nevus 01/2012    dysplastic, melanocytic atypia  . Postpartum hypertension   . Gestational hypertension   . HTN (hypertension)     Past Surgical History  Procedure Laterality Date  . Laparoscopic endometriosis fulguration    . Dilation and curettage of uterus  12/31/10  . Tonsillectomy  1993  . Bunionectomy  1998  . Cesarean section  2010  . Dermoid cyst  removed   2010  . Foot surgery    . Herniated disc surgery  2008  . Robotic assisted total hysterectomy Bilateral 11/01/2013    Procedure: ROBOTIC ASSISTED TOTAL HYSTERECTOMY WITH BILATERAL SALPINOGECTOMY;  Surgeon:  Alwyn Pea, MD;  Location: [facility];  Service: Gynecology;  Laterality: Bilateral;     Current Outpatient Prescriptions  Medication Sig Dispense Refill  . CLINDAMYCIN-BENZOYL PER-CLEANS EX Apply 1 application topically daily.    . cyclobenzaprine (FLEXERIL) 10 MG tablet Take 10 mg by mouth 3 (three) times daily as needed for muscle spasms.    . diclofenac (VOLTAREN) 75 MG EC tablet Take 1 tablet (75 mg total) by mouth 2 (two) times daily. 50 tablet 2  . diclofenac (VOLTAREN) 75 MG EC tablet Take 75 mg by mouth as needed for moderate pain.    . DULoxetine (CYMBALTA) 20 MG capsule Take 20 mg by mouth daily.    . fluticasone (FLONASE) 50 MCG/ACT nasal spray Place 2 sprays into both nostrils as needed for allergies or rhinitis.     Marland Kitchen ibuprofen (ADVIL,MOTRIN) 200 MG tablet Take 400 mg by mouth every 6 (six) hours as needed for mild pain.    Marland Kitchen ondansetron (ZOFRAN) 4 MG tablet Take 1 tablet (4 mg total) by mouth every 8 (eight) hours as needed for nausea or vomiting. 20 tablet 0  .  pseudoephedrine (SUDAFED) 30 MG tablet Take 30 mg by mouth every 6 (six) hours as needed for congestion.    . ranitidine (ZANTAC) 150 MG tablet Take 150 mg by mouth 2 (two) times daily.    . Topiramate ER 150 MG CS24 Take 150 mg by mouth daily.     No current facility-administered medications for this visit.    Allergies:   Ciprofloxacin; Codeine; Guaifenesin & derivatives; Lactose intolerance (gi); Paxil; Percocet; Prozac; Amoxicillin; Penicillins; and Vicodin    Social History:  The patient  reports that she has never smoked. She has never used smokeless tobacco. She reports that she does not drink alcohol or use illicit drugs.   Family History:  The patient's family history includes Diabetes in her father, maternal grandmother, and mother; Heart disease in her maternal grandmother and maternal uncle; Hypertension in her father and maternal grandmother.    ROS:  Please see the history of present illness.    Otherwise, review of systems are positive for none.   All other systems are reviewed and negative.    PHYSICAL EXAM: VS:  BP 110/78 mmHg  Pulse 86  Ht 5\' 4"  (1.626 m)  Wt 149 lb (67.586 kg)  BMI 25.56 kg/m2  LMP 02/13/2012 , BMI Body mass index is 25.56 kg/(m^2). GEN: Well nourished, well developed, in no acute distress HEENT: normal Neck: no JVD, carotid bruits, or masses Cardiac: RRR; no murmurs, rubs, or gallops,no edema  Respiratory:  clear to auscultation bilaterally, normal work of breathing GI: soft, nontender, nondistended, + BS MS: no deformity or atrophy Skin: warm and dry, no rash Neuro:  Strength and sensation are intact Psych: euthymic mood, full affect   EKG:  EKG is ordered today and showed NSR at 86bpm with nonspecific ST abnormality    Recent Labs: No results found for requested labs within last 365 days.    Lipid Panel No results found for: CHOL, TRIG, HDL, CHOLHDL, VLDL, LDLCALC, LDLDIRECT    Wt Readings from Last 3 Encounters:  08/05/15 149 lb (67.586 kg)  04/11/14 156 lb (70.761 kg)  02/21/14 156 lb 12.8 oz (71.124 kg)      ASSESSMENT AND PLAN:  1. HTN - well controlled off BP meds 2. SVT - no reoccurrence 3. Bicuspid AV - recheck echo   Current medicines are reviewed at length with the patient today.  The patient does not have concerns regarding medicines.  The following changes have been made:  none  Labs/ tests ordered today: See above Assessment and Plan No orders of the defined types were placed in this encounter.     Disposition:   FU with me in 1 year  Signed, Sueanne Margarita, MD  08/05/2015 9:35 AM    [facility], [place], [place]  [address] Phone: [phone]; Fax: [phone]

## 2015-08-05 ENCOUNTER — Encounter: Payer: Self-pay | Admitting: Cardiology

## 2015-08-05 ENCOUNTER — Ambulatory Visit (INDEPENDENT_AMBULATORY_CARE_PROVIDER_SITE_OTHER): Payer: 59 | Admitting: Cardiology

## 2015-08-05 VITALS — BP 110/78 | HR 86 | Ht 64.0 in | Wt 149.0 lb

## 2015-08-05 DIAGNOSIS — I10 Essential (primary) hypertension: Secondary | ICD-10-CM | POA: Diagnosis not present

## 2015-08-05 DIAGNOSIS — I471 Supraventricular tachycardia: Secondary | ICD-10-CM | POA: Diagnosis not present

## 2015-08-05 DIAGNOSIS — Q231 Congenital insufficiency of aortic valve: Secondary | ICD-10-CM

## 2015-08-05 NOTE — Patient Instructions (Addendum)
Medication Instructions:  Your physician recommends that you continue on your current medications as directed. Please refer to the Current Medication list given to you today.   Labwork: None  Testing/Procedures: Your physician has requested that you have an echocardiogram. Echocardiography is a painless test that uses sound waves to create images of your heart. It provides your doctor with information about the size and shape of your heart and how well your heart's chambers and valves are working. This procedure takes approximately one hour. There are no restrictions for this procedure.  Follow-Up: Your physician wants you to follow-up in: 1 year with Dr. Turner. You will receive a reminder letter in the mail two months in advance. If you don't receive a letter, please call our office to schedule the follow-up appointment.   Any Other Special Instructions Will Be Listed Below (If Applicable).   

## 2015-08-15 ENCOUNTER — Other Ambulatory Visit: Payer: Self-pay | Admitting: Gastroenterology

## 2015-08-15 NOTE — Addendum Note (Signed)
Addended by: OUTLAW, WILLIAM on: 08/15/2015 04:51 PM   Modules accepted: Orders  

## 2015-08-18 ENCOUNTER — Ambulatory Visit (HOSPITAL_COMMUNITY): Payer: 59 | Attending: Cardiology

## 2015-08-18 ENCOUNTER — Other Ambulatory Visit: Payer: Self-pay

## 2015-08-18 ENCOUNTER — Encounter (HOSPITAL_COMMUNITY): Payer: Self-pay | Admitting: *Deleted

## 2015-08-18 DIAGNOSIS — I10 Essential (primary) hypertension: Secondary | ICD-10-CM | POA: Diagnosis not present

## 2015-08-18 DIAGNOSIS — I071 Rheumatic tricuspid insufficiency: Secondary | ICD-10-CM | POA: Diagnosis not present

## 2015-08-18 DIAGNOSIS — Q231 Congenital insufficiency of aortic valve: Secondary | ICD-10-CM | POA: Diagnosis present

## 2015-08-18 DIAGNOSIS — Z8249 Family history of ischemic heart disease and other diseases of the circulatory system: Secondary | ICD-10-CM | POA: Diagnosis not present

## 2015-08-18 DIAGNOSIS — I340 Nonrheumatic mitral (valve) insufficiency: Secondary | ICD-10-CM | POA: Insufficient documentation

## 2015-08-19 ENCOUNTER — Other Ambulatory Visit: Payer: Self-pay | Admitting: Gastroenterology

## 2015-08-19 NOTE — Addendum Note (Signed)
Addended by: OUTLAW, WILLIAM on: 08/19/2015 01:59 PM   Modules accepted: Orders  

## 2015-08-20 ENCOUNTER — Ambulatory Visit (HOSPITAL_COMMUNITY)
Admission: RE | Admit: 2015-08-20 | Discharge: 2015-08-20 | Disposition: A | Discharge status: Home / Self Care | Payer: 59 | Source: Ambulatory Visit | Attending: Gastroenterology | Admitting: Gastroenterology

## 2015-08-20 ENCOUNTER — Ambulatory Visit (HOSPITAL_COMMUNITY): Payer: 59 | Admitting: Anesthesiology

## 2015-08-20 ENCOUNTER — Encounter (HOSPITAL_COMMUNITY): Payer: Self-pay | Admitting: Anesthesiology

## 2015-08-20 ENCOUNTER — Encounter (HOSPITAL_COMMUNITY)
Admission: RE | Disposition: A | Discharge status: Home / Self Care | Payer: Self-pay | Source: Ambulatory Visit | Attending: Gastroenterology

## 2015-08-20 DIAGNOSIS — R935 Abnormal findings on diagnostic imaging of other abdominal regions, including retroperitoneum: Secondary | ICD-10-CM | POA: Insufficient documentation

## 2015-08-20 DIAGNOSIS — Z7951 Long term (current) use of inhaled steroids: Secondary | ICD-10-CM | POA: Insufficient documentation

## 2015-08-20 DIAGNOSIS — J45909 Unspecified asthma, uncomplicated: Secondary | ICD-10-CM | POA: Insufficient documentation

## 2015-08-20 DIAGNOSIS — Z79899 Other long term (current) drug therapy: Secondary | ICD-10-CM | POA: Diagnosis not present

## 2015-08-20 DIAGNOSIS — Z86010 Personal history of colonic polyps: Secondary | ICD-10-CM | POA: Diagnosis not present

## 2015-08-20 DIAGNOSIS — F419 Anxiety disorder, unspecified: Secondary | ICD-10-CM | POA: Diagnosis not present

## 2015-08-20 DIAGNOSIS — K648 Other hemorrhoids: Secondary | ICD-10-CM | POA: Insufficient documentation

## 2015-08-20 DIAGNOSIS — R109 Unspecified abdominal pain: Secondary | ICD-10-CM | POA: Diagnosis present

## 2015-08-20 DIAGNOSIS — F329 Major depressive disorder, single episode, unspecified: Secondary | ICD-10-CM | POA: Insufficient documentation

## 2015-08-20 DIAGNOSIS — G43909 Migraine, unspecified, not intractable, without status migrainosus: Secondary | ICD-10-CM | POA: Diagnosis not present

## 2015-08-20 DIAGNOSIS — I499 Cardiac arrhythmia, unspecified: Secondary | ICD-10-CM | POA: Diagnosis not present

## 2015-08-20 DIAGNOSIS — Q231 Congenital insufficiency of aortic valve: Secondary | ICD-10-CM | POA: Diagnosis not present

## 2015-08-20 DIAGNOSIS — K644 Residual hemorrhoidal skin tags: Secondary | ICD-10-CM | POA: Diagnosis not present

## 2015-08-20 DIAGNOSIS — K921 Melena: Secondary | ICD-10-CM | POA: Insufficient documentation

## 2015-08-20 DIAGNOSIS — K219 Gastro-esophageal reflux disease without esophagitis: Secondary | ICD-10-CM | POA: Diagnosis not present

## 2015-08-20 DIAGNOSIS — K2950 Unspecified chronic gastritis without bleeding: Secondary | ICD-10-CM | POA: Diagnosis not present

## 2015-08-20 DIAGNOSIS — K589 Irritable bowel syndrome without diarrhea: Secondary | ICD-10-CM | POA: Diagnosis not present

## 2015-08-20 DIAGNOSIS — I10 Essential (primary) hypertension: Secondary | ICD-10-CM | POA: Diagnosis not present

## 2015-08-20 HISTORY — PX: COLONOSCOPY WITH PROPOFOL: SHX5780

## 2015-08-20 HISTORY — PX: ENTEROSCOPY: SHX5533

## 2015-08-20 SURGERY — ENTEROSCOPY
Anesthesia: Monitor Anesthesia Care

## 2015-08-20 MED ORDER — SODIUM CHLORIDE 0.9 % IV SOLN: INTRAVENOUS | Status: DC

## 2015-08-20 MED ORDER — PROPOFOL 10 MG/ML IV BOLUS
INTRAVENOUS | Status: AC
  Filled 2015-08-20: qty 20

## 2015-08-20 MED ORDER — FENTANYL CITRATE (PF) 100 MCG/2ML IJ SOLN
INTRAMUSCULAR | Status: AC
Start: 2015-08-20 — End: 2015-08-20
  Filled 2015-08-20: qty 2

## 2015-08-20 MED ORDER — PROPOFOL INFUSION 10 MG/ML OPTIME
INTRAVENOUS | Status: DC | PRN
  Administered 2015-08-20: 200 ug/kg/min via INTRAVENOUS

## 2015-08-20 MED ORDER — ONDANSETRON HCL 4 MG/2ML IJ SOLN
INTRAMUSCULAR | Status: DC | PRN
  Administered 2015-08-20: 4 mg via INTRAVENOUS

## 2015-08-20 MED ORDER — DEXAMETHASONE SODIUM PHOSPHATE 10 MG/ML IJ SOLN
INTRAMUSCULAR | Status: DC | PRN
  Administered 2015-08-20: 5 mg via INTRAVENOUS

## 2015-08-20 MED ORDER — ONDANSETRON HCL 4 MG/2ML IJ SOLN
INTRAMUSCULAR | Status: AC
  Filled 2015-08-20: qty 2

## 2015-08-20 MED ORDER — DEXAMETHASONE SODIUM PHOSPHATE 10 MG/ML IJ SOLN
INTRAMUSCULAR | Status: AC
  Filled 2015-08-20: qty 1

## 2015-08-20 MED ORDER — FENTANYL CITRATE (PF) 100 MCG/2ML IJ SOLN
INTRAMUSCULAR | Status: DC | PRN
  Administered 2015-08-20: 25 ug via INTRAVENOUS

## 2015-08-20 MED ORDER — GLYCOPYRROLATE 0.2 MG/ML IJ SOLN
INTRAMUSCULAR | Status: DC | PRN
  Administered 2015-08-20: 0.2 mg via INTRAVENOUS

## 2015-08-20 MED ORDER — LACTATED RINGERS IV SOLN
INTRAVENOUS | Status: DC | PRN
  Administered 2015-08-20: 10:00:00 via INTRAVENOUS
  Administered 2015-08-20: 1000 mL

## 2015-08-20 SURGICAL SUPPLY — 22 items

## 2015-08-20 NOTE — Anesthesia Preprocedure Evaluation (Signed)
Anesthesia Evaluation  Patient identified by MRN, date of birth, ID band Patient awake    Reviewed: Allergy & Precautions, NPO status , Patient's Chart, lab work & pertinent test results  History of Anesthesia Complications (+) PONV and history of anesthetic complications  Airway Mallampati: II  TM Distance: >3 FB Neck ROM: Full    Dental  (+) Teeth Intact   Pulmonary neg pulmonary ROS,    breath sounds clear to auscultation       Cardiovascular hypertension,  Rhythm:Regular  Bicuspid aortic valve   Neuro/Psych  Headaches, PSYCHIATRIC DISORDERS Depression    GI/Hepatic Neg liver ROS, GERD  Medicated and Controlled,  Endo/Other  negative endocrine ROS  Renal/GU negative Renal ROS     Musculoskeletal negative musculoskeletal ROS (+)   Abdominal   Peds  Hematology negative hematology ROS (+)   Anesthesia Other Findings   Reproductive/Obstetrics                             Anesthesia Physical Anesthesia Plan  ASA: II  Anesthesia Plan: MAC   Post-op Pain Management:    Induction: Intravenous  Airway Management Planned: Nasal Cannula  Additional Equipment: None  Intra-op Plan:   Post-operative Plan:   Informed Consent: I have reviewed the patients History and Physical, chart, labs and discussed the procedure including the risks, benefits and alternatives for the proposed anesthesia with the patient or authorized representative who has indicated his/her understanding and acceptance.   Dental advisory given  Plan Discussed with: CRNA and Surgeon  Anesthesia Plan Comments:         Anesthesia Quick Evaluation

## 2015-08-20 NOTE — Anesthesia Postprocedure Evaluation (Signed)
  Anesthesia Post-op Note  Patient: Madeline Everett  Procedure(s) Performed: Procedure(s): ENTEROSCOPY (N/A) COLONOSCOPY WITH PROPOFOL (N/A)  Patient Location: Endoscopy Unit  Anesthesia Type:MAC  Level of Consciousness: awake  Airway and Oxygen Therapy: Patient Spontanous Breathing  Post-op Pain: none  Post-op Assessment: Post-op Vital signs reviewed, Patient's Cardiovascular Status Stable, Respiratory Function Stable, Patent Airway, No signs of Nausea or vomiting and Pain level controlled              Post-op Vital Signs: Reviewed and stable  Last Vitals:  Filed Vitals:   08/20/15 1200  BP: 126/79  Pulse: 73  Temp:   Resp: 22    Complications: No apparent anesthesia complications

## 2015-08-20 NOTE — Discharge Instructions (Signed)
Enteroscopy  Care After Please read the instructions outlined below and refer to this sheet in the next few weeks. These discharge instructions provide you with general information on caring for yourself after you leave the hospital. Your doctor may also give you specific instructions. While your treatment has been planned according to the most current medical practices available, unavoidable complications occasionally occur. If you have any problems or questions after discharge, please call Dr. Paulita Fujita [facility]) at [phone].  HOME CARE INSTRUCTIONS Activity  You may resume your regular activity but move at a slower pace for the next 24 hours.   Take frequent rest periods for the next 24 hours.   Walking will help expel (get rid of) the air and reduce the bloated feeling in your abdomen.   No driving for 24 hours (because of the anesthesia (medicine) used during the test).   You may shower.   Do not sign any important legal documents or operate any machinery for 24 hours (because of the anesthesia used during the test).  Nutrition  Drink plenty of fluids.   You may resume your normal diet.   Begin with a light meal and progress to your normal diet.   Avoid alcoholic beverages for 24 hours or as instructed by your caregiver.  Medications You may resume your normal medications unless your caregiver tells you otherwise. What you can expect today  You may experience abdominal discomfort such as a feeling of fullness or "gas" pains.   You may experience a sore throat for 2 to 3 days. This is normal. Gargling with salt water may help this.    SEEK IMMEDIATE MEDICAL CARE IF:  You have excessive nausea (feeling sick to your stomach) and/or vomiting.   You have severe abdominal pain and distention (swelling).   You have trouble swallowing.   You have a temperature over 100 F (37.8 C).   You have rectal bleeding or vomiting of blood.  Document Released:  06/29/2004 Document Revised: 07/28/2011 Document Reviewed: 01/10/2008 [facility] Patient Information 2012 [place].  Colonoscopy  Post procedure instructions:  Read the instructions outlined below and refer to this sheet in the next few weeks. These discharge instructions provide you with general information on caring for yourself after you leave the hospital. Your doctor may also give you specific instructions. While your treatment has been planned according to the most current medical practices available, unavoidable complications occasionally occur. If you have any problems or questions after discharge, call Dr. Paulita Fujita at [facility] [phone]).  HOME CARE INSTRUCTIONS  ACTIVITY:  You may resume your regular activity, but move at a slower pace for the next 24 hours.   Take frequent rest periods for the next 24 hours.   Walking will help get rid of the air and reduce the bloated feeling in your belly (abdomen).   No driving for 24 hours (because of the medicine (anesthesia) used during the test).   You may shower.   Do not sign any important legal documents or operate any machinery for 24 hours (because of the anesthesia used during the test).  NUTRITION:  Drink plenty of fluids.   You may resume your normal diet as instructed by your doctor.   Begin with a light meal and progress to your normal diet. Heavy or fried foods are harder to digest and may make you feel sick to your stomach (nauseated).   Avoid alcoholic beverages for 24 hours or as instructed.  MEDICATIONS:  You may resume your  normal medications unless your doctor tells you otherwise.  WHAT TO EXPECT TODAY:  Some feelings of bloating in the abdomen.   Passage of more gas than usual.   Spotting of blood in your stool or on the toilet paper.  IF YOU HAD POLYPS REMOVED DURING THE COLONOSCOPY:  No aspirin products for 7 days or as instructed.   No alcohol for 7 days or as instructed.   Eat a  soft diet for the next 24 hours.   FINDING OUT THE RESULTS OF YOUR TEST  Not all test results are available during your visit. If your test results are not back during the visit, make an appointment with your caregiver to find out the results. Do not assume everything is normal if you have not heard from your caregiver or the medical facility. It is important for you to follow up on all of your test results.     SEEK IMMEDIATE MEDICAL CARE IF:   You have more than a spotting of blood in your stool.   Your belly is swollen (abdominal distention).   You are nauseated or vomiting.   You have a fever.   You have abdominal pain or discomfort that is severe or gets worse throughout the day.    Document Released: 06/29/2004 Document Revised: 07/28/2011 Document Reviewed: 06/27/2008 [facility] Patient Information 2012 [place].

## 2015-08-20 NOTE — Op Note (Signed)
[facility] [place], [address]   COLONOSCOPY PROCEDURE REPORT  PATIENT: Madeline Everett, [place]  MR#: [id number] BIRTHDATE: Aug 15, 1976 , 38  yrs. old GENDER: female ENDOSCOPIST: Arta Silence, MD REFERRED YK:DXIPJAS Dema Severin, M.D. PROCEDURE DATE:  August 28, 2015 PROCEDURE:   Colonoscopy, diagnostic ASA CLASS:   Class II INDICATIONS:blood in stool. MEDICATIONS: Monitored anesthesia care  DESCRIPTION OF PROCEDURE:   After the risks benefits and alternatives of the procedure were thoroughly explained, informed consent was obtained.  revealed external hemorrhoids.   The pediatric colonoscope was introduced through the anus and advanced to the terminal ileum which was intubated for a short distance. No adverse events experienced.   The quality of the prep was adequate The instrument was then slowly withdrawn as the colon was fully examined. Estimated blood loss is zero unless otherwise noted in this procedure report.    Findings:  Mild external hemorrhoids, otherwise normal digital rectal examination.  Mild internal hemorrhoids, otherwise normal retroflexed view of rectum.  Colon otherwise normal without polyps, masses, vascular ectasias, or inflammatory changes.  Normal distal 5cm of the terminal ileum.           Withdrawal time was about 10 minutes     .  The scope was withdrawn and the procedure completed.  COMPLICATIONS: No immediate complications.  ENDOSCOPIC IMPRESSION:     Mild internal and external hemorrhoids, likely cause of patient's hematochezia.  Otherwise normal colonoscopy to level of terminal ileum.  RECOMMENDATIONS:     1.  Watch for potential complications of procedure. 2.  High fiber diet. 3.  Topical therapies (e.g., Preparation-H) as needed for hemorrhoidal bleeding. 4.  Follow-up with [facility] in 4-6 weeks for ongoing management. 5.  Repeat colonoscopy at age 48 for routine colon cancer screening.  eSigned:  Arta Silence, MD  08/28/15 11:33 AM   cc:  CPT CODES: ICD CODES:  The ICD and CPT codes recommended by this software are interpretations from the data that the clinical staff has captured with the software.  The verification of the translation of this report to the ICD and CPT codes and modifiers is the sole responsibility of the health care institution and practicing physician where this report was generated.  [place]. will not be held responsible for the validity of the ICD and CPT codes included on this report.  AMA assumes no liability for data contained or not contained herein. CPT is a Designer, television/film set of the [employer].

## 2015-08-20 NOTE — Op Note (Signed)
[facility] [place], [address]   ENTEROSCOPY PROCEDURE REPORT     EXAM DATE: 08/20/2015  PATIENT NAME:      Madeline Everett, Madeline Everett           MR #:      [id number]  BIRTHDATE:       12-Jun-1976      VISIT #:     [phone]  ATTENDING:     Arta Silence, MD     STATUS:     outpatient ASSISTANT:      Scherrie Bateman, and Sherle Poe MD: Harlan Stains, M.D. ASA CLASS:        Class II  INDICATIONS:  The patient is a 39 yr old female here for an enteroscopy procedure due to abdominal pain, blood in stool, abnormal CT scan (distal duodenum and proximal jejunal thickening).   PROCEDURE PERFORMED:     Diagnostic small bowel enteroscopy  MEDICATIONS:     Monitored anesthesia care  CONSENT: The patient understands the risks and benefits of the procedure and understands that these risks include, but are not limited to: sedation, allergic reaction, infection, perforation and/or bleeding. Alternative means of evaluation and treatment include, among others: physical exam, x-rays, and/or surgical intervention. The patient elects to proceed with this endoscopic procedure.  DESCRIPTION OF PROCEDURE: During intra-op preparation period all mechanical & medical equipment was checked for proper function. Hand hygiene and appropriate measures for infection prevention was taken. After the risks, benefits and alternatives of the procedure were thoroughly explained, Informed consent was verified, confirmed and timeout was successfully executed by the treatment team. The pediatric colonoscope was introduced through the mouth and advanced to the proximal jejunum jejunum. The prep was The overall prep quality was good.. The instrument was then slowly withdrawn while examining the mucosa circumferentially. The scope was then completely withdrawn from the patient and the procedure terminated. The pulse, BP, and O2 saturation were monitored  and documented by the physician and the nursing staff throughout the entire procedure.  The patient was cared for as planned according to standard protocol, then discharged to recovery in stable condition and with appropriate post procedure care. Estimated blood loss is zero unless otherwise noted in this procedure report.  Proximal jejunum.   GE junction.   Antral gastritis.    FINDINGS:      Normal esophagus.  Mild antral gastritis with some erosions.  Normal duodenum.  Normal proximal jejunum.  IMPRESSIONS:     As above.  Mild antral gastritis, otherwise normal enteroscopy to the proximal jejunum.  No findings in distal duodenum or proximal jejunum to correlate with CT scan, which suggested the enteritis was self-limited and likely infectious in origin.  RECOMMENDATIONS:     1.  Watch for potential complications of procedure. 2.  Proceed with colonoscopy today. RECALL:  _____________________________ Arta Silence, MD eSigned:  Arta Silence, MD 08/20/2015 11:28 AM   cc:

## 2015-08-20 NOTE — Transfer of Care (Signed)
Immediate Anesthesia Transfer of Care Note  Patient: Madeline Everett  Procedure(s) Performed: Procedure(s): ENTEROSCOPY (N/A) COLONOSCOPY WITH PROPOFOL (N/A)  Patient Location: PACU  Anesthesia Type:MAC  Level of Consciousness:  sedated, patient cooperative and responds to stimulation  Airway & Oxygen Therapy:Patient Spontanous Breathing and Patient connected to face mask oxgen  Post-op Assessment:  Report given to PACU RN and Post -op Vital signs reviewed and stable  Post vital signs:  Reviewed and stable  Last Vitals:  Filed Vitals:   08/20/15 1126  BP: 102/65  Pulse: 107  Temp: 36.4 C  Resp: 14    Complications: No apparent anesthesia complications

## 2015-08-20 NOTE — H&P (Signed)
Patient interval history reviewed.  Patient examined again.  There has been no change from documented H/P dated 08/14/15 (scanned into chart from our office) except as documented above.  Assessment:  1.  Abdominal pain. 2.  Abnormal CT scan (proximal jejunal and distal duodenal thickening). 3.  Blood in stool.  Plan:  1.  Enteroscopy. 2.  Risks (bleeding, infection, bowel perforation that could require surgery, sedation-related changes in cardiopulmonary systems), benefits (identification and possible treatment of source of symptoms, exclusion of certain causes of symptoms), and alternatives (watchful waiting, radiographic imaging studies, empiric medical treatment) of upper endoscopy with enteroscopy were explained to patient/family in detail and patient wishes to proceed. 3.  Colonoscopy. 4.  Risks (bleeding, infection, bowel perforation that could require surgery, sedation-related changes in cardiopulmonary systems), benefits (identification and possible treatment of source of symptoms, exclusion of certain causes of symptoms), and alternatives (watchful waiting, radiographic imaging studies, empiric medical treatment) of colonoscopy were explained to patient/family in detail and patient wishes to proceed.

## 2015-08-22 ENCOUNTER — Encounter (HOSPITAL_COMMUNITY): Payer: Self-pay | Admitting: Gastroenterology

## 2015-08-22 ENCOUNTER — Telehealth: Payer: Self-pay | Admitting: *Deleted

## 2015-08-22 NOTE — Telephone Encounter (Signed)
Advised patient of results   Notes Recorded by Theodoro Parma, RN on 08/21/2015 at 5:22 PM Left message to call back. ------  Notes Recorded by Sueanne Margarita, MD on 08/18/2015 at 7:19 PM Please let patient know that echo showed normal LVFwith bicuspid AV, mild MR - repeat echo in 2 years

## 2016-04-30 ENCOUNTER — Encounter: Payer: Self-pay | Admitting: Cardiology

## 2016-04-30 ENCOUNTER — Telehealth: Payer: Self-pay

## 2016-04-30 DIAGNOSIS — R000 Tachycardia, unspecified: Secondary | ICD-10-CM

## 2016-04-30 NOTE — Telephone Encounter (Signed)
-----   Message from Sueanne Margarita, MD sent at 04/30/2016  3:54 PM EDT ----- I suspect this was not accurate.  Please get a 24 hour Holter monitor

## 2016-04-30 NOTE — Telephone Encounter (Signed)
Patient agrees to holter monitor. Ordered for scheduling.

## 2016-05-05 ENCOUNTER — Ambulatory Visit (INDEPENDENT_AMBULATORY_CARE_PROVIDER_SITE_OTHER): Payer: 59

## 2016-05-05 DIAGNOSIS — R000 Tachycardia, unspecified: Secondary | ICD-10-CM

## 2016-06-30 ENCOUNTER — Other Ambulatory Visit: Payer: Self-pay | Admitting: Family Medicine

## 2016-06-30 ENCOUNTER — Ambulatory Visit
Admission: RE | Admit: 2016-06-30 | Discharge: 2016-06-30 | Disposition: A | Discharge status: Home / Self Care | Payer: 59 | Source: Ambulatory Visit | Attending: Family Medicine | Admitting: Family Medicine

## 2016-06-30 DIAGNOSIS — R110 Nausea: Secondary | ICD-10-CM

## 2016-06-30 DIAGNOSIS — R1031 Right lower quadrant pain: Secondary | ICD-10-CM

## 2016-06-30 MED ORDER — IOPAMIDOL (ISOVUE-300) INJECTION 61%
100.0000 mL | Freq: Once | INTRAVENOUS | Status: AC | PRN
  Administered 2016-06-30: 100 mL via INTRAVENOUS

## 2016-07-20 ENCOUNTER — Encounter: Payer: Self-pay | Admitting: Cardiology

## 2016-08-04 ENCOUNTER — Encounter (INDEPENDENT_AMBULATORY_CARE_PROVIDER_SITE_OTHER): Payer: Self-pay

## 2016-08-04 ENCOUNTER — Ambulatory Visit (INDEPENDENT_AMBULATORY_CARE_PROVIDER_SITE_OTHER): Payer: 59 | Admitting: Cardiology

## 2016-08-04 VITALS — BP 106/80 | HR 80 | Ht 64.0 in | Wt 148.8 lb

## 2016-08-04 DIAGNOSIS — Q231 Congenital insufficiency of aortic valve: Secondary | ICD-10-CM

## 2016-08-04 DIAGNOSIS — I471 Supraventricular tachycardia: Secondary | ICD-10-CM

## 2016-08-04 DIAGNOSIS — I059 Rheumatic mitral valve disease, unspecified: Secondary | ICD-10-CM | POA: Diagnosis not present

## 2016-08-04 LAB — BASIC METABOLIC PANEL
BUN: 11 mg/dL (ref 7–25)
CALCIUM: 9 mg/dL (ref 8.6–10.2)
CO2: 21 mmol/L (ref 20–31)
CREATININE: 0.85 mg/dL (ref 0.50–1.10)
Chloride: 110 mmol/L (ref 98–110)
GLUCOSE: 89 mg/dL (ref 65–99)
Potassium: 4.9 mmol/L (ref 3.5–5.3)
Sodium: 139 mmol/L (ref 135–146)

## 2016-08-04 NOTE — Patient Instructions (Signed)
Medication Instructions:  Your physician recommends that you continue on your current medications as directed. Please refer to the Current Medication list given to you today.   Labwork: TODAY: BMET  Testing/Procedures: Your physician has requested that you have an echocardiogram. Echocardiography is a painless test that uses sound waves to create images of your heart. It provides your doctor with information about the size and shape of your heart and how well your heart's chambers and valves are working. This procedure takes approximately one hour. There are no restrictions for this procedure.  Dr. Radford Pax recommends you have a CARDIAC MRI.  Follow-Up: Your physician wants you to follow-up in: 1 year with Dr. Radford Pax. You will receive a reminder letter in the mail two months in advance. If you don't receive a letter, please call our office to schedule the follow-up appointment.   Any Other Special Instructions Will Be Listed Below (If Applicable).     If you need a refill on your cardiac medications before your next appointment, please call your pharmacy.

## 2016-08-04 NOTE — Progress Notes (Signed)
Cardiology Office Note    Date:  08/04/2016   ID:  Madeline Everett, DOB Feb 10, 1976, MRN [id number]  PCP:  Vidal Schwalbe, MD  Cardiologist:  Fransico Him, MD   Chief Complaint  Patient presents with  . Follow-up    bicuspid aortic valve, SVVT    History of Present Illness:  Madeline Everett is a 40 y.o. female with a history of HTN, SVT and bicuspid AV presents today for followup. She is doing well. She denies any chest pain, SOB, DOE, LE edema, dizziness or syncope. She occasionally notices a skipped beat.      Past Medical History:  Diagnosis Date  . Acid reflux   . Aortic valve disorder    Bicuspid aortic valve  . Atypical chest pain   . Complication of anesthesia    nausea & vomiting and difficult time waking up  . Compound nevus 01/2012   dysplastic, melanocytic atypia  . Depression    crying spells for a year due to stress of  infertility  . Deviated septum    Dr Ruby Cola  . Endometriosis   . FH: thyroid condition   . Gestational hypertension   . H/O candidiasis   . H/O constipation   . H/O dyspareunia   . H/O varicella    childhood  . Headache(784.0)    migraine topomax  . Heart murmur    congential bicuspid aortic valve, mitral valve prolapse  . Herniated disc 2008  . HTN (hypertension)   . IBS (irritable bowel syndrome)   . Infertility, female   . Pelvic pain   . PONV (postoperative nausea and vomiting)    needs Scop patch  . Postpartum hypertension   . SVT (supraventricular tachycardia) ([place])   . UTI (lower urinary tract infection)   . Yeast infection     Past Surgical History:  Procedure Laterality Date  . ABDOMINAL HYSTERECTOMY    . BUNIONECTOMY  1998  . CESAREAN SECTION  2010  . COLONOSCOPY WITH PROPOFOL N/A 08/20/2015   Procedure: COLONOSCOPY WITH PROPOFOL;  Surgeon: Arta Silence, MD;  Location: [facility];  Service: Endoscopy;  Laterality: N/A;  . dermoid cyst  removed   2010  . DILATION AND CURETTAGE OF UTERUS  12/31/10  .  ENTEROSCOPY N/A 08/20/2015   Procedure: ENTEROSCOPY;  Surgeon: Arta Silence, MD;  Location: [facility];  Service: Endoscopy;  Laterality: N/A;  . FOOT SURGERY    . herniated disc surgery  2008  . LAPAROSCOPIC ENDOMETRIOSIS FULGURATION    . ROBOTIC ASSISTED TOTAL HYSTERECTOMY Bilateral 11/01/2013   Procedure: ROBOTIC ASSISTED TOTAL HYSTERECTOMY WITH BILATERAL SALPINOGECTOMY;  Surgeon: Alwyn Pea, MD;  Location: [facility];  Service: Gynecology;  Laterality: Bilateral;  . TONSILLECTOMY  1993    Current Medications: Outpatient Medications Prior to Visit  Medication Sig Dispense Refill  . albuterol (PROVENTIL HFA;VENTOLIN HFA) 108 (90 BASE) MCG/ACT inhaler Inhale 1-2 puffs into the lungs every 6 (six) hours as needed for wheezing or shortness of breath.    Marland Kitchen CLINDAMYCIN-BENZOYL PER-CLEANS EX Apply 1 application topically every morning.     . DULoxetine (CYMBALTA) 20 MG capsule Take 20 mg by mouth every morning.     . fluticasone (FLONASE) 50 MCG/ACT nasal spray Place 2 sprays into both nostrils as needed for allergies or rhinitis.     Marland Kitchen ibuprofen (ADVIL,MOTRIN) 200 MG tablet Take 400 mg by mouth every 6 (six) hours as needed for mild pain.    . Magnesium 250 MG TABS Take  1 tablet by mouth every evening.    . mometasone-formoterol (DULERA) 200-5 MCG/ACT AERO Inhale 2 puffs into the lungs 2 (two) times daily as needed for wheezing.    . pseudoephedrine (SUDAFED) 30 MG tablet Take 30 mg by mouth every 6 (six) hours as needed for congestion.    . ranitidine (ZANTAC) 150 MG tablet Take 150 mg by mouth 2 (two) times daily as needed for heartburn.     . saccharomyces boulardii (FLORASTOR) 250 MG capsule Take 250 mg by mouth 2 (two) times daily.    . Topiramate ER 150 MG CS24 Take 150 mg by mouth at bedtime.      No facility-administered medications prior to visit.      Allergies:   Ciprofloxacin; Codeine; Fluoxetine; Guaifenesin; Guaifenesin & derivatives; Lactose intolerance (gi); Lactulose;  Oxycodone-acetaminophen; Paxil [paroxetine hcl]; Percocet [oxycodone-acetaminophen]; Prozac [fluoxetine hcl]; Amoxicillin; Penicillins; and Vicodin [hydrocodone-acetaminophen]   Social History   Social History  . Marital status: Married    Spouse name: N/A  . Number of children: N/A  . Years of education: N/A   Social History Main Topics  . Smoking status: Never Smoker  . Smokeless tobacco: Never Used  . Alcohol use No  . Drug use: No  . Sexual activity: Yes    Birth control/ protection: Pill   Other Topics Concern  . Not on file   Social History Narrative  . No narrative on file     Family History:  The patient's family history includes Diabetes in her father, maternal grandmother, and mother; Heart disease in her maternal grandmother and maternal uncle; Hypertension in her father and maternal grandmother.   ROS:   Please see the history of present illness.    ROS All other systems reviewed and are negative.   PHYSICAL EXAM:   VS:  BP 106/80   Pulse 80   Ht 5\' 4"  (1.626 m)   Wt 148 lb 12.8 oz (67.5 kg)   LMP 02/13/2012   BMI 25.54 kg/m    GEN: Well nourished, well developed, in no acute distress  HEENT: normal  Neck: no JVD, carotid bruits, or masses Cardiac: RRR; no murmurs, rubs, or gallops,no edema.  Intact distal pulses bilaterally.  Respiratory:  clear to auscultation bilaterally, normal work of breathing GI: soft, nontender, nondistended, + BS MS: no deformity or atrophy  Skin: warm and dry, no rash Neuro:  Alert and Oriented x 3, Strength and sensation are intact Psych: euthymic mood, full affect  Wt Readings from Last 3 Encounters:  08/04/16 148 lb 12.8 oz (67.5 kg)  08/20/15 149 lb (67.6 kg)  08/05/15 149 lb (67.6 kg)      Studies/Labs Reviewed:   EKG:  EKG is ordered today.  The ekg ordered today demonstrates NSR with no ST abnormality and normal intervals.   Recent Labs: No results found for requested labs within last 8760 hours.   Lipid  Panel No results found for: CHOL, TRIG, HDL, CHOLHDL, VLDL, LDLCALC, LDLDIRECT  Additional studies/ records that were reviewed today include:  none    ASSESSMENT:    1. BICUSPID AORTIC VALVE   2. SVT (supraventricular tachycardia) ([place])   3. Mitral valve disorder      PLAN:  In order of problems listed above:  1. Bicuspid AV - she is asymptomatic.  Her last echo a year ago was unchanged.  Since there is an associated aortopathy with BAV and her last MRI/MRA of the aorta was 2 years ago showing mildly enlarged  ascending aorta compared to descending aorta, will repeat to assess stability.  Repeat echo in another year. She has a family history of BAV in other family members on her mom's side.  I counseled her on the need to have screening in her children initially when they are teenagers.  2. SVT - no reoccurrence 3. Mild MR by echo a year ago.    Medication Adjustments/Labs and Tests Ordered: Current medicines are reviewed at length with the patient today.  Concerns regarding medicines are outlined above.  Medication changes, Labs and Tests ordered today are listed in the Patient Instructions below.  There are no Patient Instructions on file for this visit.   Signed, Fransico Him, MD  08/04/2016 9:25 AM    [facility] [place], [place], [place]  [address] Phone: [phone]; Fax: [phone]

## 2016-08-09 ENCOUNTER — Other Ambulatory Visit: Payer: Self-pay

## 2016-08-09 DIAGNOSIS — I059 Rheumatic mitral valve disease, unspecified: Secondary | ICD-10-CM

## 2016-08-09 DIAGNOSIS — Q231 Congenital insufficiency of aortic valve: Secondary | ICD-10-CM

## 2016-08-11 ENCOUNTER — Telehealth: Payer: Self-pay | Admitting: Cardiology

## 2016-08-11 ENCOUNTER — Encounter: Payer: Self-pay | Admitting: Cardiology

## 2016-08-11 NOTE — Telephone Encounter (Signed)
Called patient and gave date, time and location of cardiac MRI/MRA.  I also mailed a set of directions for the patient from her home to [facility] for her test.

## 2016-08-23 ENCOUNTER — Ambulatory Visit (HOSPITAL_COMMUNITY)
Admission: RE | Admit: 2016-08-23 | Discharge: 2016-08-23 | Disposition: A | Discharge status: Home / Self Care | Payer: 59 | Source: Ambulatory Visit | Attending: Cardiology | Admitting: Cardiology

## 2016-08-23 ENCOUNTER — Ambulatory Visit (HOSPITAL_COMMUNITY): Admission: RE | Admit: 2016-08-23 | Payer: 59 | Source: Ambulatory Visit

## 2016-08-23 DIAGNOSIS — I059 Rheumatic mitral valve disease, unspecified: Secondary | ICD-10-CM | POA: Diagnosis present

## 2016-08-23 DIAGNOSIS — I509 Heart failure, unspecified: Secondary | ICD-10-CM | POA: Diagnosis not present

## 2016-08-23 DIAGNOSIS — I340 Nonrheumatic mitral (valve) insufficiency: Secondary | ICD-10-CM | POA: Insufficient documentation

## 2016-08-23 DIAGNOSIS — Q231 Congenital insufficiency of aortic valve: Secondary | ICD-10-CM

## 2016-08-23 DIAGNOSIS — I071 Rheumatic tricuspid insufficiency: Secondary | ICD-10-CM | POA: Insufficient documentation

## 2016-08-23 MED ORDER — GADOBENATE DIMEGLUMINE 529 MG/ML IV SOLN
25.0000 mL | Freq: Once | INTRAVENOUS | Status: AC | PRN
  Administered 2016-08-23: 25 mL via INTRAVENOUS

## 2016-08-25 ENCOUNTER — Telehealth: Payer: Self-pay | Admitting: Cardiology

## 2016-08-25 NOTE — Telephone Encounter (Signed)
New message     Pt calling to get results of test.

## 2016-08-25 NOTE — Telephone Encounter (Signed)
Patient called to get results of MRI. Informed her that Dr. Radford Pax is reviewing MRI. She understands she will be called when Dr. Radford Pax sends results. She was grateful for call.

## 2016-12-08 DIAGNOSIS — Z01419 Encounter for gynecological examination (general) (routine) without abnormal findings: Secondary | ICD-10-CM | POA: Diagnosis not present

## 2016-12-08 DIAGNOSIS — N6314 Unspecified lump in the right breast, lower inner quadrant: Secondary | ICD-10-CM | POA: Diagnosis not present

## 2016-12-08 DIAGNOSIS — M545 Low back pain: Secondary | ICD-10-CM | POA: Diagnosis not present

## 2016-12-09 ENCOUNTER — Other Ambulatory Visit: Payer: Self-pay | Admitting: Obstetrics and Gynecology

## 2016-12-09 DIAGNOSIS — N6310 Unspecified lump in the right breast, unspecified quadrant: Secondary | ICD-10-CM

## 2016-12-14 DIAGNOSIS — J0100 Acute maxillary sinusitis, unspecified: Secondary | ICD-10-CM | POA: Diagnosis not present

## 2016-12-15 ENCOUNTER — Other Ambulatory Visit: Payer: 59

## 2016-12-21 ENCOUNTER — Ambulatory Visit
Admission: RE | Admit: 2016-12-21 | Discharge: 2016-12-21 | Disposition: A | Discharge status: Home / Self Care | Payer: 59 | Source: Ambulatory Visit | Attending: Obstetrics and Gynecology | Admitting: Obstetrics and Gynecology

## 2016-12-21 DIAGNOSIS — N6310 Unspecified lump in the right breast, unspecified quadrant: Secondary | ICD-10-CM | POA: Diagnosis not present

## 2017-01-10 DIAGNOSIS — M25561 Pain in right knee: Secondary | ICD-10-CM | POA: Diagnosis not present

## 2017-01-10 DIAGNOSIS — Z0000 Encounter for general adult medical examination without abnormal findings: Secondary | ICD-10-CM | POA: Diagnosis not present

## 2017-01-19 DIAGNOSIS — R102 Pelvic and perineal pain: Secondary | ICD-10-CM | POA: Diagnosis not present

## 2017-01-19 DIAGNOSIS — N803 Endometriosis of pelvic peritoneum: Secondary | ICD-10-CM | POA: Diagnosis not present

## 2017-01-26 DIAGNOSIS — Z0000 Encounter for general adult medical examination without abnormal findings: Secondary | ICD-10-CM | POA: Diagnosis not present

## 2017-02-10 DIAGNOSIS — J069 Acute upper respiratory infection, unspecified: Secondary | ICD-10-CM | POA: Diagnosis not present

## 2017-02-10 DIAGNOSIS — J4530 Mild persistent asthma, uncomplicated: Secondary | ICD-10-CM | POA: Diagnosis not present

## 2017-02-21 DIAGNOSIS — J189 Pneumonia, unspecified organism: Secondary | ICD-10-CM | POA: Diagnosis not present

## 2017-02-21 DIAGNOSIS — J209 Acute bronchitis, unspecified: Secondary | ICD-10-CM | POA: Diagnosis not present

## 2017-03-03 DIAGNOSIS — J189 Pneumonia, unspecified organism: Secondary | ICD-10-CM | POA: Diagnosis not present

## 2017-03-03 DIAGNOSIS — R05 Cough: Secondary | ICD-10-CM | POA: Diagnosis not present

## 2017-04-11 DIAGNOSIS — R071 Chest pain on breathing: Secondary | ICD-10-CM | POA: Diagnosis not present

## 2017-04-11 DIAGNOSIS — M25512 Pain in left shoulder: Secondary | ICD-10-CM | POA: Diagnosis not present

## 2017-06-28 DIAGNOSIS — G43111 Migraine with aura, intractable, with status migrainosus: Secondary | ICD-10-CM | POA: Diagnosis not present

## 2017-06-28 DIAGNOSIS — G43019 Migraine without aura, intractable, without status migrainosus: Secondary | ICD-10-CM | POA: Diagnosis not present

## 2017-06-28 DIAGNOSIS — G43719 Chronic migraine without aura, intractable, without status migrainosus: Secondary | ICD-10-CM | POA: Diagnosis not present

## 2017-07-11 IMAGING — CT CT ABD-PELV W/ CM
2 of 4 series · 15 of 46 positions shown, 17 images · IV contrast (APPLIED)
Comparison: None.

CLINICAL DATA: Chronic pain, primarily in the flank regions

EXAM:
CT ABDOMEN AND PELVIS WITH CONTRAST
TECHNIQUE: Multidetector CT imaging of the abdomen and pelvis was performed
using the standard protocol following bolus administration of
intravenous contrast. Oral contrast was also administered.
CONTRAST:  100mL TKKPAL-IPP IOPAMIDOL (TKKPAL-IPP) INJECTION 61%

[Series 2: abd pelvis 5.0 i41s 1 · axial · 0.66mm/px · z∈[-450,-10]mm · 12 of 98 slices shown, 14 images]
[im 5/98  soft-tissue]
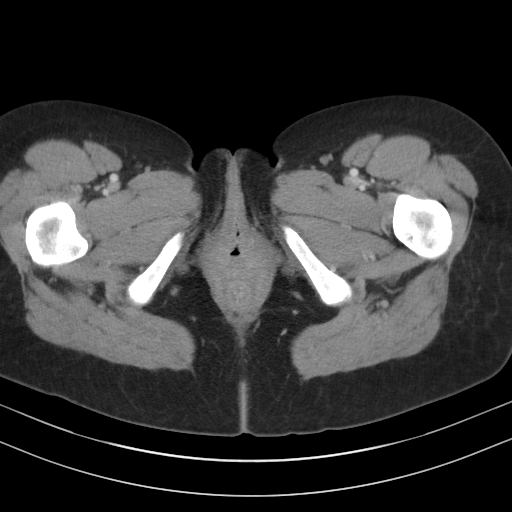
[im 5/98  bone]
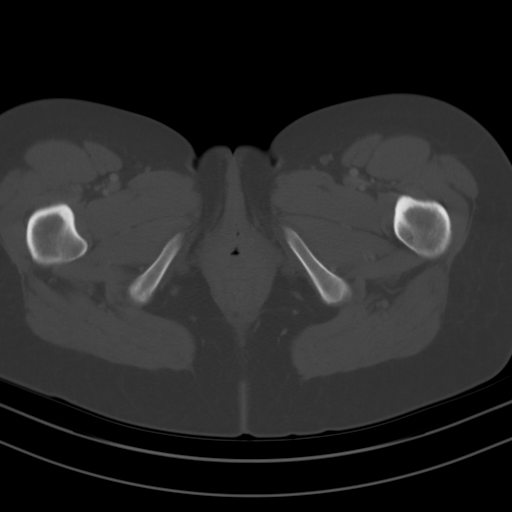
[im 13/98  soft-tissue]
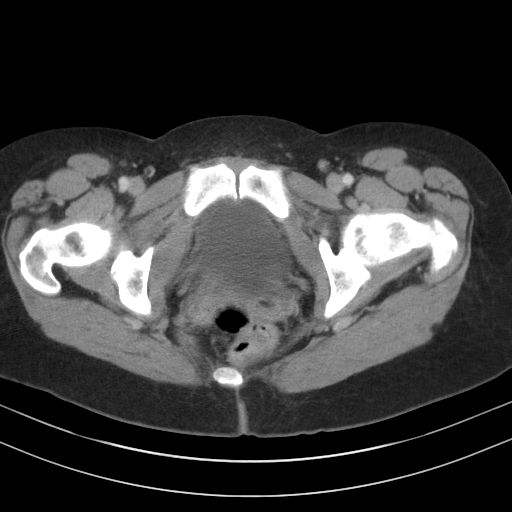
[im 22/98  soft-tissue]
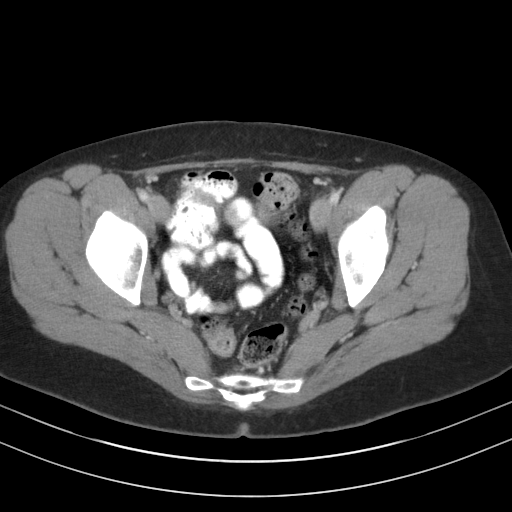
[im 30/98  soft-tissue]
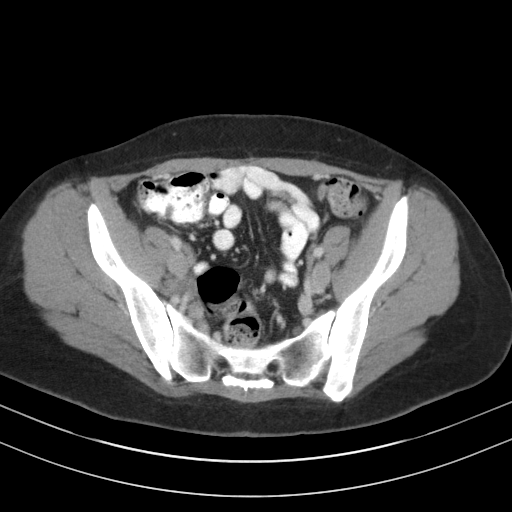
[im 38/98  soft-tissue]
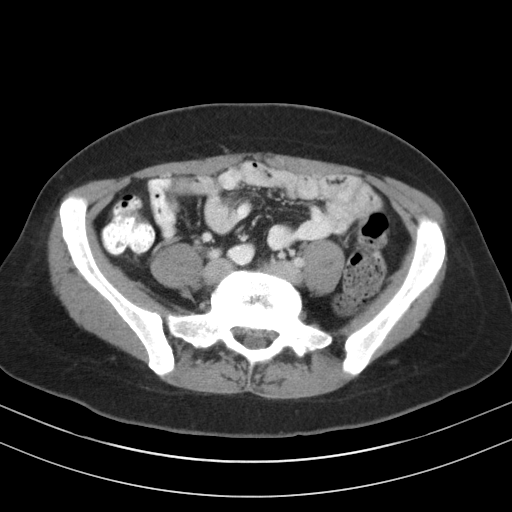
[im 47/98  soft-tissue]
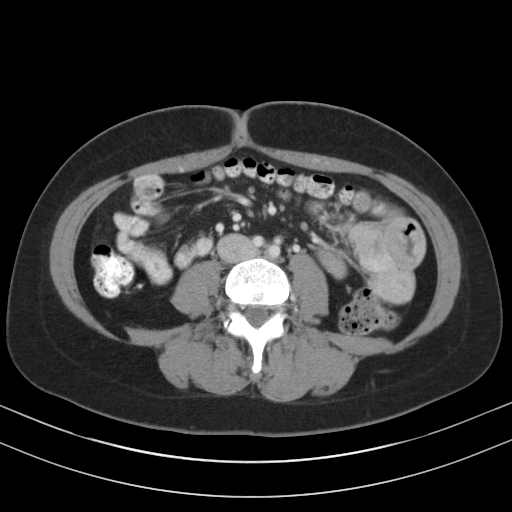
[im 51/98  soft-tissue]
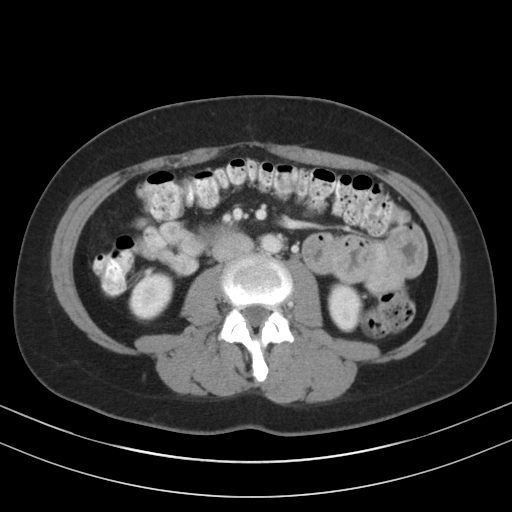
[im 60/98  soft-tissue]
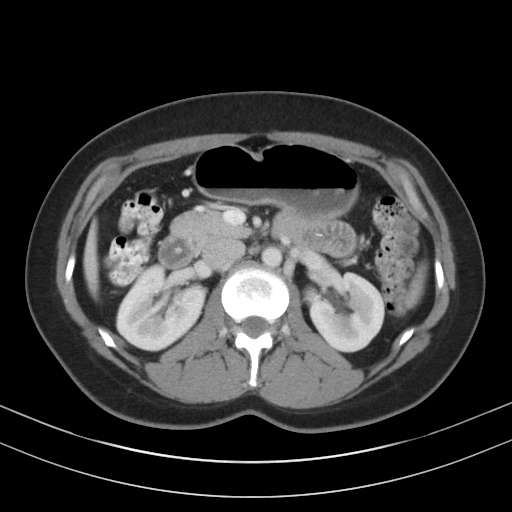
[im 68/98  soft-tissue]
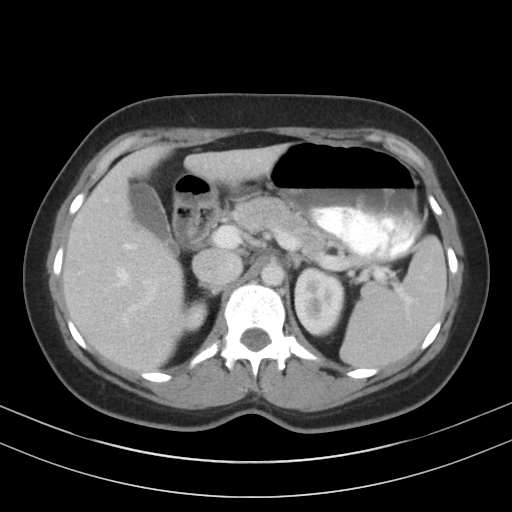
[im 68/98  bone]
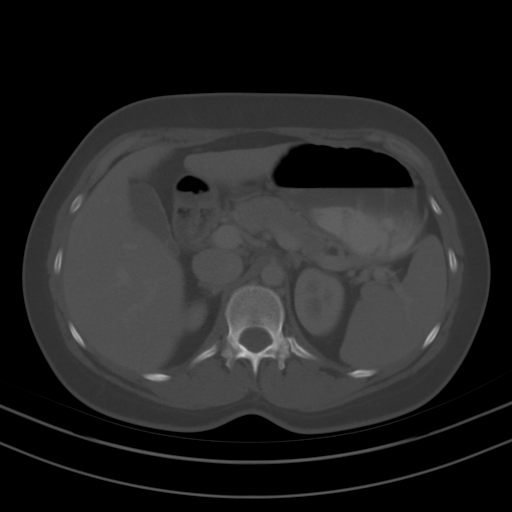
[im 76/98  soft-tissue]
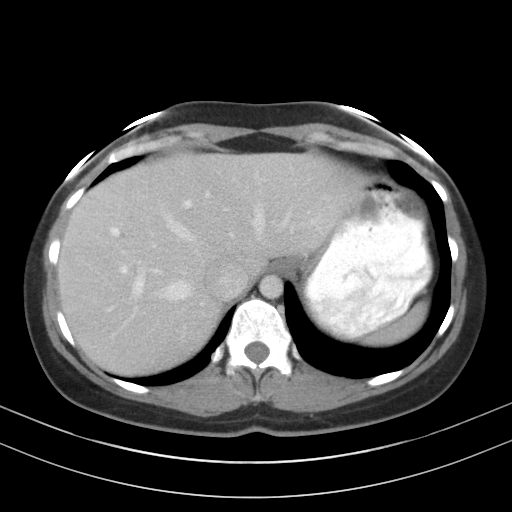
[im 85/98  soft-tissue]
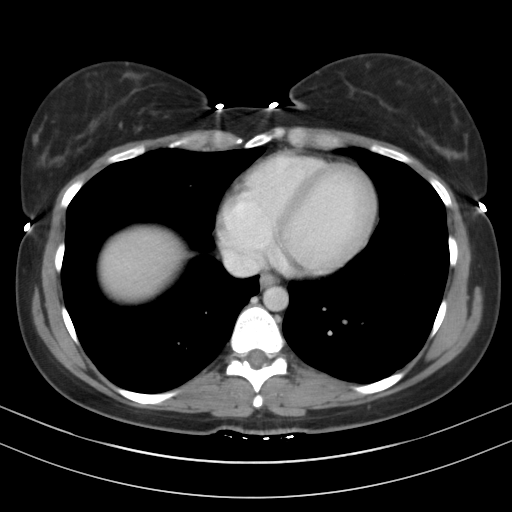
[im 93/98  soft-tissue]
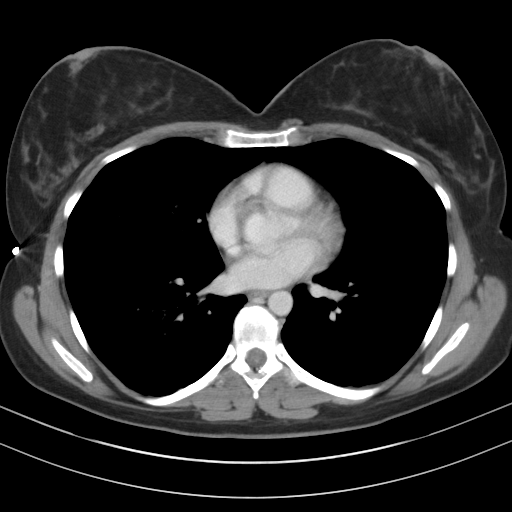

[Series 3: abd pelvis 3.0 spo cor · coronal · 0.71mm/px · 3 of 65 slices shown]
[im 22/65  soft-tissue]
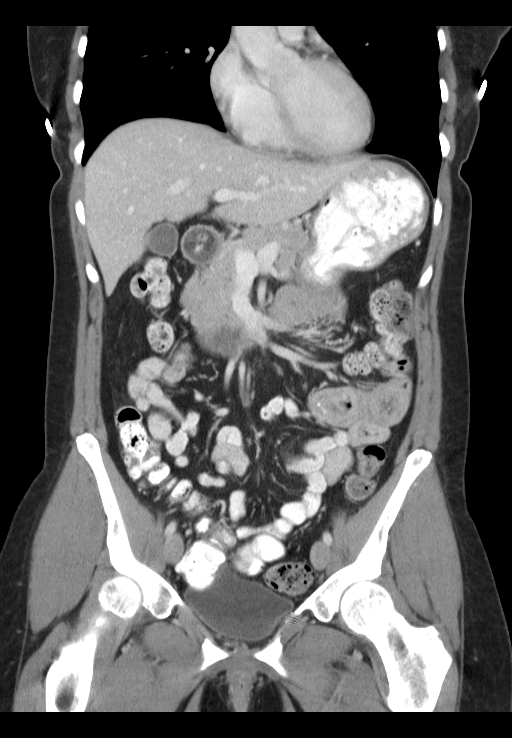
[im 29/65  soft-tissue]
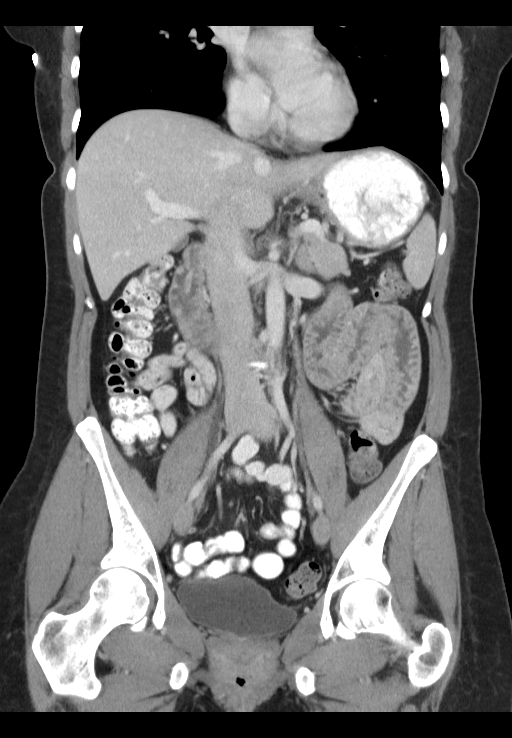
[im 36/65  soft-tissue]
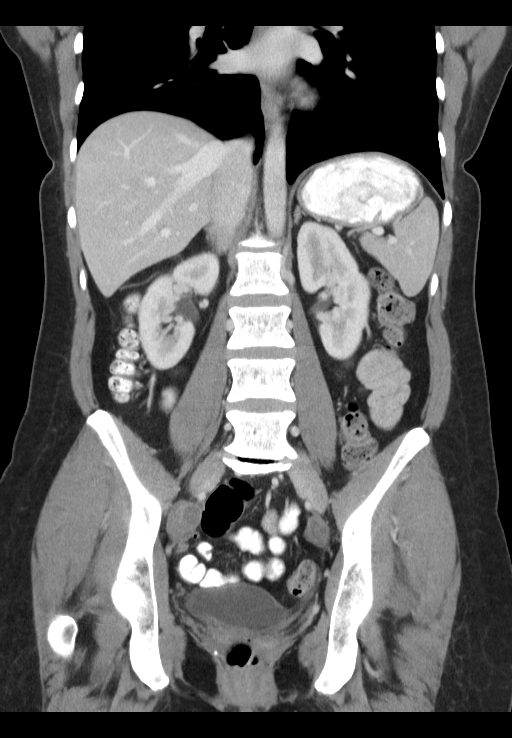

[15 of 46 positions shown; findings below may reference images not displayed]

FINDINGS: There is slight atelectasis in the lung bases. No lung base edema or
consolidation.

No focal liver lesions are identified. Gallbladder wall is not
appreciably thickened. There is no biliary duct dilatation.

Spleen, pancreas, and adrenals appear normal. Kidneys bilaterally
show no mass or hydronephrosis on either side. There is no renal or
ureteral calculus on either side.

In the pelvis, the urinary bladder is midline with normal wall
thickness. Uterus is absent. There is no demonstrable pelvic mass or
pelvic fluid collection. Appendix appears normal.

There is no bowel obstruction. There is wall thickening in several
loops of duodenum and proximal jejunum suggesting a degree of
enteritis. There is no surrounding mesenteric thickening, however.
There is no free air or portal venous air.

There is no ascites, adenopathy, or abscess in the abdomen or
pelvis. There is no abdominal aortic aneurysm. There is disc space
narrowing with vacuum phenomenon at L5-S1. No blastic or lytic bone
lesions are identified.
IMPRESSION: There is fold thickening in loops of duodenum and proximal jejunum
consistent with a degree of enteritis. No ulceration identified. No
surrounding mesenteric thickening.

No bowel obstruction. No free air. No abscess. Appendix appears
normal. No renal or ureteral calculus. No hydronephrosis.

Uterus absent.

## 2017-08-05 ENCOUNTER — Encounter (INDEPENDENT_AMBULATORY_CARE_PROVIDER_SITE_OTHER): Payer: Self-pay

## 2017-08-05 ENCOUNTER — Ambulatory Visit (HOSPITAL_COMMUNITY): Payer: 59 | Attending: Cardiology

## 2017-08-05 ENCOUNTER — Other Ambulatory Visit: Payer: Self-pay

## 2017-08-05 DIAGNOSIS — Q231 Congenital insufficiency of aortic valve: Secondary | ICD-10-CM | POA: Insufficient documentation

## 2017-08-05 DIAGNOSIS — I059 Rheumatic mitral valve disease, unspecified: Secondary | ICD-10-CM | POA: Diagnosis not present

## 2017-08-05 DIAGNOSIS — I10 Essential (primary) hypertension: Secondary | ICD-10-CM | POA: Diagnosis not present

## 2017-08-05 DIAGNOSIS — R0789 Other chest pain: Secondary | ICD-10-CM | POA: Insufficient documentation

## 2017-10-04 ENCOUNTER — Encounter (INDEPENDENT_AMBULATORY_CARE_PROVIDER_SITE_OTHER): Payer: Self-pay

## 2017-10-04 ENCOUNTER — Encounter: Payer: Self-pay | Admitting: Cardiology

## 2017-10-04 ENCOUNTER — Ambulatory Visit (INDEPENDENT_AMBULATORY_CARE_PROVIDER_SITE_OTHER): Payer: 59 | Admitting: Cardiology

## 2017-10-04 VITALS — BP 138/84 | HR 89 | Ht 64.0 in | Wt 165.0 lb

## 2017-10-04 DIAGNOSIS — Q231 Congenital insufficiency of aortic valve: Secondary | ICD-10-CM

## 2017-10-04 DIAGNOSIS — I10 Essential (primary) hypertension: Secondary | ICD-10-CM

## 2017-10-04 DIAGNOSIS — I471 Supraventricular tachycardia: Secondary | ICD-10-CM

## 2017-10-04 NOTE — Progress Notes (Signed)
Cardiology Office Note:    Date:  10/04/2017   ID:  Madeline Everett, DOB 1976-11-15, MRN [id number]  PCP:  Harlan Stains, MD  Cardiologist:  Fransico Him, MD   Referring MD: Harlan Stains, MD   Chief Complaint  Patient presents with  . Follow-up    HTN, bicupid AV, SVT    History of Present Illness:    Madeline Everett is a 41 y.o. female with a hx of HTN, SVT and bicuspid AV .  She is here today for followup and is doing well.  She denies any chest pain or pressure, SOB, DOE, PND, orthopnea, LE edema, dizziness, palpitations or syncope.  She is compliant with her meds and is tolerating meds with no SE.     Past Medical History:  Diagnosis Date  . Acid reflux   . Aortic valve disorder    Bicuspid aortic valve  . Atypical chest pain   . Complication of anesthesia    nausea & vomiting and difficult time waking up  . Compound nevus 01/2012   dysplastic, melanocytic atypia  . Depression    crying spells for a year due to stress of  infertility  . Deviated septum    Dr Ruby Cola  . Endometriosis   . FH: thyroid condition   . Gestational hypertension   . H/O candidiasis   . H/O constipation   . H/O dyspareunia   . H/O varicella    childhood  . Headache(784.0)    migraine topomax  . Heart murmur    congential bicuspid aortic valve, mitral valve prolapse  . Herniated disc 2008  . HTN (hypertension)   . IBS (irritable bowel syndrome)   . Infertility, female   . Pelvic pain   . PONV (postoperative nausea and vomiting)    needs Scop patch  . Postpartum hypertension   . SVT (supraventricular tachycardia) ([place])   . UTI (lower urinary tract infection)   . Yeast infection     Past Surgical History:  Procedure Laterality Date  . ABDOMINAL HYSTERECTOMY    . BUNIONECTOMY  1998  . CESAREAN SECTION  2010  . dermoid cyst  removed   2010  . DILATION AND CURETTAGE OF UTERUS  12/31/10  . FOOT SURGERY    . herniated disc surgery  2008  . LAPAROSCOPIC ENDOMETRIOSIS  FULGURATION    . TONSILLECTOMY  1993    Current Medications: Current Meds  Medication Sig  . albuterol (PROVENTIL HFA;VENTOLIN HFA) 108 (90 BASE) MCG/ACT inhaler Inhale 1-2 puffs into the lungs every 6 (six) hours as needed for wheezing or shortness of breath.  Marland Kitchen CLINDAMYCIN-BENZOYL PER-CLEANS EX Apply 1 application topically every morning.   . fluticasone (FLONASE) 50 MCG/ACT nasal spray Place 2 sprays into both nostrils as needed for allergies or rhinitis.   Marland Kitchen ibuprofen (ADVIL,MOTRIN) 200 MG tablet Take 400 mg by mouth every 6 (six) hours as needed for mild pain.  . Magnesium 250 MG TABS Take 1 tablet by mouth every evening.  . mometasone-formoterol (DULERA) 200-5 MCG/ACT AERO Inhale 2 puffs into the lungs 2 (two) times daily as needed for wheezing.  . pseudoephedrine (SUDAFED) 30 MG tablet Take 30 mg by mouth every 6 (six) hours as needed for congestion.  . ranitidine (ZANTAC) 150 MG tablet Take 150 mg by mouth 2 (two) times daily as needed for heartburn.   . Topiramate ER 150 MG CS24 Take 150 mg by mouth at bedtime.   . [DISCONTINUED] saccharomyces boulardii (FLORASTOR) 250 MG  capsule Take 250 mg by mouth 2 (two) times daily.     Allergies:   Ciprofloxacin; Codeine; Fluoxetine; Guaifenesin; Guaifenesin & derivatives; Lactose; Lactose intolerance (gi); Lactulose; Oxycodone-acetaminophen; Paxil [paroxetine hcl]; Percocet [oxycodone-acetaminophen]; Prozac [fluoxetine hcl]; Amoxicillin; Hydrocodone-acetaminophen; Iodine; Penicillins; and Vicodin [hydrocodone-acetaminophen]   Social History   Socioeconomic History  . Marital status: Married    Spouse name: None  . Number of children: None  . Years of education: None  . Highest education level: None  Social Needs  . Financial resource strain: None  . Food insecurity - worry: None  . Food insecurity - inability: None  . Transportation needs - medical: None  . Transportation needs - non-medical: None  Occupational History  . None    Tobacco Use  . Smoking status: Never Smoker  . Smokeless tobacco: Never Used  Substance and Sexual Activity  . Alcohol use: No  . Drug use: No  . Sexual activity: Yes    Birth control/protection: Pill  Other Topics Concern  . None  Social History Narrative  . None     Family History: The patient's family history includes Diabetes in her father, maternal grandmother, and mother; Heart disease in her maternal grandmother and maternal uncle; Hypertension in her father and maternal grandmother.  ROS:   Please see the history of present illness.    Review of Systems  Psychiatric/Behavioral: The patient is nervous/anxious.     All other systems reviewed and negative.   EKGs/Labs/Other Studies Reviewed:    The following studies were reviewed today: None  EKG:  EKG is  ordered today.  The ekg ordered today demonstrates NSR at 89bpm with no ST changes  Recent Labs: No results found for requested labs within last 8760 hours.   Recent Lipid Panel No results found for: CHOL, TRIG, HDL, CHOLHDL, VLDL, LDLCALC, LDLDIRECT  Physical Exam:    VS:  BP 138/84   Pulse 89   Ht 5\' 4"  (1.626 m)   Wt 165 lb (74.8 kg)   LMP 02/13/2012   BMI 28.32 kg/m     Wt Readings from Last 3 Encounters:  10/04/17 165 lb (74.8 kg)  08/04/16 148 lb 12.8 oz (67.5 kg)  08/20/15 149 lb (67.6 kg)     GEN:  Well nourished, well developed in no acute distress HEENT: Normal NECK: No JVD; No carotid bruits LYMPHATICS: No lymphadenopathy CARDIAC: RRR, no murmurs, rubs, gallops RESPIRATORY:  Clear to auscultation without rales, wheezing or rhonchi  ABDOMEN: Soft, non-tender, non-distended MUSCULOSKELETAL:  No edema; No deformity  SKIN: Warm and dry NEUROLOGIC:  Alert and oriented x 3 PSYCHIATRIC:  Normal affect   ASSESSMENT:    1. SVT (supraventricular tachycardia) ([place])   2. Essential hypertension   3. BICUSPID AORTIC VALVE    PLAN:    In order of problems listed above:  1.  SVT-she has  not had any recurrence of palpitations.  2.  Hypertension - blood pressure well controlled on exam today.  She is currently not on any antihypertensive medications.  3.  Bicuspid aortic valve -   Recent 2D echocardiogram showed normal bicuspid aortic valve with no stenosis or regurgitation.  LV function is normal.   Medication Adjustments/Labs and Tests Ordered: Current medicines are reviewed at length with the patient today.  Concerns regarding medicines are outlined above.  No orders of the defined types were placed in this encounter.  No orders of the defined types were placed in this encounter.   Signed, Fransico Him, MD  10/04/2017 2:34 PM    [facility]

## 2017-10-04 NOTE — Patient Instructions (Signed)
Medication Instructions:   Your physician recommends that you continue on your current medications as directed. Please refer to the Current Medication list given to you today.    Follow-Up:  Your physician wants you to follow-up in: ONE YEAR WITH DR TURNER You will receive a reminder letter in the mail two months in advance. If you don't receive a letter, please call our office to schedule the follow-up appointment.        If you need a refill on your cardiac medications before your next appointment, please call your pharmacy.   

## 2017-12-28 DIAGNOSIS — G43019 Migraine without aura, intractable, without status migrainosus: Secondary | ICD-10-CM | POA: Diagnosis not present

## 2017-12-28 DIAGNOSIS — G43719 Chronic migraine without aura, intractable, without status migrainosus: Secondary | ICD-10-CM | POA: Diagnosis not present

## 2017-12-28 DIAGNOSIS — G43111 Migraine with aura, intractable, with status migrainosus: Secondary | ICD-10-CM | POA: Diagnosis not present

## 2017-12-29 ENCOUNTER — Encounter (INDEPENDENT_AMBULATORY_CARE_PROVIDER_SITE_OTHER): Payer: Self-pay | Admitting: Surgery

## 2017-12-29 ENCOUNTER — Ambulatory Visit (INDEPENDENT_AMBULATORY_CARE_PROVIDER_SITE_OTHER): Payer: 59 | Admitting: Surgery

## 2017-12-29 ENCOUNTER — Ambulatory Visit (INDEPENDENT_AMBULATORY_CARE_PROVIDER_SITE_OTHER): Payer: 59

## 2017-12-29 DIAGNOSIS — M5136 Other intervertebral disc degeneration, lumbar region: Secondary | ICD-10-CM | POA: Diagnosis not present

## 2017-12-29 DIAGNOSIS — M51369 Other intervertebral disc degeneration, lumbar region without mention of lumbar back pain or lower extremity pain: Secondary | ICD-10-CM

## 2017-12-29 DIAGNOSIS — M545 Low back pain: Secondary | ICD-10-CM

## 2017-12-29 MED ORDER — METHYLPREDNISOLONE 4 MG PO TABS: ORAL_TABLET | ORAL | 0 refills | Status: DC

## 2017-12-29 MED ORDER — METHOCARBAMOL 500 MG PO TABS: 500.0000 mg | ORAL_TABLET | Freq: Three times a day (TID) | ORAL | 0 refills | Status: DC | PRN

## 2017-12-29 MED ORDER — TRAMADOL HCL 50 MG PO TABS: 50.0000 mg | ORAL_TABLET | Freq: Three times a day (TID) | ORAL | 0 refills | Status: DC | PRN

## 2017-12-29 NOTE — Progress Notes (Signed)
Office Visit Note   Patient: Madeline Everett           Date of Birth: [birth year]           MRN: [id number] Visit Date: 12/29/2017              Requested by: Harlan Stains, MD [address] [place], [place] [address] PCP: Harlan Stains, MD   Assessment & Plan: Visit Diagnoses:  1. Acute right-sided low back pain, with sciatica presence unspecified   2. Degenerative disc disease, lumbar     Plan: At this point will attempt conservative treatment.  Patient was given prescriptions for Medrol Dosepak 6-day taper to be taken as directed along with Robaxin for spasms and Ultram for pain.  She asked to be given a note for work allowing her to use a tall desk and this was done.  Follow-up in 2 weeks for recheck.  She continues to be symptomatic we did discuss scheduling MRI lumbar spine.    Follow-Up Instructions: Return in about 2 weeks (around 01/12/2018) for With Jeneen Rinks.   Orders:  Orders Placed This Encounter  Procedures  . XR Lumbar Spine 2-3 Views   Meds ordered this encounter  Medications  . methylPREDNISolone (MEDROL) 4 MG tablet    Sig: 6 day taper to be taken as directed    Dispense:  21 tablet    Refill:  0  . methocarbamol (ROBAXIN) 500 MG tablet    Sig: Take 1 tablet (500 mg total) by mouth every 8 (eight) hours as needed for muscle spasms.    Dispense:  40 tablet    Refill:  0  . traMADol (ULTRAM) 50 MG tablet    Sig: Take 1 tablet (50 mg total) by mouth every 8 (eight) hours as needed.    Dispense:  30 tablet    Refill:  0      Procedures: No procedures performed   Clinical Data: No additional findings.   Subjective: Chief Complaint  Patient presents with  . Lower Back - Pain    HPI Patient comes in today with complaints of acute on chronic low back pain and intermittent numbness and tingling right lower extremity.  Patient is status post L5-S1 microdiscectomy by Dr. Louanne Skye 2011.  States that she has had off-and-on aches and pains in her  central low back over the last several years with occasional flareups.  States that yesterday she was doing some stretching exercises when she felt a sharp grabbing pain in the central low back and she Has also been having some intermittent numbness and tingling down to her right foot.  Currently pain aggravated with bending twisting lifting sitting.  Over the last several years patient does admit to having some neurogenic claudication symptoms.  While in the grocery store she does have to lean forward over a cart to help relieve some of her discomfort.  Denies any bowel or bladder incontinence.  No symptoms down the left leg. Review of Systems No current cardiac pulmonary GI GU issues  Objective: Vital Signs: LMP 02/13/2012   Physical Exam  Constitutional: She is oriented to person, place, and time. She appears well-developed. No distress.  HENT:  Head: Normocephalic and atraumatic.  Eyes: EOM are normal.  Neck: Normal range of motion.  Pulmonary/Chest: No respiratory distress.  Musculoskeletal:  Gait is somewhat antalgic.  Pain with lumbar extension.  Limited lumbar flexion with hands to mid thigh.  Positive lumbar paraspinal tenderness.  Moderate to marked  right sciatic notch tenderness.  Negative on the left side.  Mildly tender over the right hip greater trochanter bursa.  Right straight leg raise does cause some discomfort in the low back and also right hamstring.  Nothing further down the leg.  Negative logroll bilateral hips.  Bilateral calves nontender.  2+ deep tendon reflexes lower extremities.  Question trace right gastroc weakness the patient did have some low back soreness with this maneuver so somewhat difficult to tell if this was due to lack of effort.  Neurological: She is alert and oriented to person, place, and time.  Skin: Skin is warm and dry.  Psychiatric: She has a normal mood and affect.    Ortho Exam  Specialty Comments:  No specialty comments  available.  Imaging: Xr Lumbar Spine 2-3 Views  Result Date: 12/29/2017 X-ray lumbar spine shows disc space collapse at L5-S1 with anterior vertebral spurs.  Question trace L4 anterolisthesis on L5.  Stable with flexion and extension.    PMFS History: Patient Active Problem List   Diagnosis Date Noted  . HTN (hypertension) 02/21/2014  . SVT (supraventricular tachycardia) ([place]) 02/21/2014  . Postpartum hypertension   . Pelvic pain 11/01/2013  . DEPRESSION 05/30/2008  . Mitral valve disorder 05/30/2008  . ANAL FISSURE 05/30/2008  . BICUSPID AORTIC VALVE 05/30/2008  . DYSPHAGIA UNSPECIFIED 05/30/2008  . MIGRAINE HEADACHE 05/29/2008  . GERD 05/29/2008  . CONSTIPATION 05/29/2008  . IBS 05/29/2008  . EPIGASTRIC PAIN 05/29/2008  . Hx of endometriosis 12/24/2003   Past Medical History:  Diagnosis Date  . Acid reflux   . Aortic valve disorder    Bicuspid aortic valve  . Atypical chest pain   . Complication of anesthesia    nausea & vomiting and difficult time waking up  . Compound nevus 01/2012   dysplastic, melanocytic atypia  . Depression    crying spells for a year due to stress of  infertility  . Deviated septum    Dr Ruby Cola  . Endometriosis   . FH: thyroid condition   . Gestational hypertension   . H/O candidiasis   . H/O constipation   . H/O dyspareunia   . H/O varicella    childhood  . Headache(784.0)    migraine topomax  . Heart murmur    congential bicuspid aortic valve, mitral valve prolapse  . Herniated disc 2008  . HTN (hypertension)   . IBS (irritable bowel syndrome)   . Infertility, female   . Pelvic pain   . PONV (postoperative nausea and vomiting)    needs Scop patch  . Postpartum hypertension   . SVT (supraventricular tachycardia) ([place])   . UTI (lower urinary tract infection)   . Yeast infection     Family History  Problem Relation Age of Onset  . Diabetes Mother   . Hypertension Father   . Diabetes Father   . Heart disease Maternal  Uncle   . Hypertension Maternal Grandmother   . Diabetes Maternal Grandmother   . Heart disease Maternal Grandmother     Past Surgical History:  Procedure Laterality Date  . ABDOMINAL HYSTERECTOMY    . BUNIONECTOMY  1998  . CESAREAN SECTION  2010  . COLONOSCOPY WITH PROPOFOL N/A 08/20/2015   Procedure: COLONOSCOPY WITH PROPOFOL;  Surgeon: Arta Silence, MD;  Location: [facility];  Service: Endoscopy;  Laterality: N/A;  . dermoid cyst  removed   2010  . DILATION AND CURETTAGE OF UTERUS  12/31/10  . ENTEROSCOPY N/A 08/20/2015   Procedure:  ENTEROSCOPY;  Surgeon: Arta Silence, MD;  Location: [facility];  Service: Endoscopy;  Laterality: N/A;  . FOOT SURGERY    . herniated disc surgery  2008  . LAPAROSCOPIC ENDOMETRIOSIS FULGURATION    . ROBOTIC ASSISTED TOTAL HYSTERECTOMY Bilateral 11/01/2013   Procedure: ROBOTIC ASSISTED TOTAL HYSTERECTOMY WITH BILATERAL SALPINOGECTOMY;  Surgeon: Alwyn Pea, MD;  Location: [facility];  Service: Gynecology;  Laterality: Bilateral;  . TONSILLECTOMY  1993   Social History   Occupational History  . Not on file  Tobacco Use  . Smoking status: Never Smoker  . Smokeless tobacco: Never Used  Substance and Sexual Activity  . Alcohol use: No  . Drug use: No  . Sexual activity: Yes    Birth control/protection: Pill

## 2017-12-30 ENCOUNTER — Ambulatory Visit (INDEPENDENT_AMBULATORY_CARE_PROVIDER_SITE_OTHER): Payer: Self-pay | Admitting: Orthopaedic Surgery

## 2018-01-12 ENCOUNTER — Encounter (INDEPENDENT_AMBULATORY_CARE_PROVIDER_SITE_OTHER): Payer: Self-pay | Admitting: Surgery

## 2018-01-12 ENCOUNTER — Ambulatory Visit (INDEPENDENT_AMBULATORY_CARE_PROVIDER_SITE_OTHER): Payer: 59 | Admitting: Surgery

## 2018-01-12 DIAGNOSIS — M545 Low back pain: Secondary | ICD-10-CM | POA: Diagnosis not present

## 2018-01-12 NOTE — Progress Notes (Signed)
Office Visit Note   Patient: Madeline Everett           Date of Birth: 03-Jan-1976           MRN: [id number] Visit Date: 01/12/2018              Requested by: Harlan Stains, MD [address] [place], [place] [address] PCP: Harlan Stains, MD   Assessment & Plan: Visit Diagnoses:  1. Acute right-sided low back pain, with sciatica presence unspecified     Plan:  The patient's ongoing symptoms and failed conservative treatment up to this point I recommend getting a lumbar spine MRI to rule out HNP/stenosis.  Follow-up in the office after completion to discuss results with Dr. Louanne Skye.  She will continue piriformis and hamstring stretching.    Follow-Up Instructions: Return in about 3 weeks (around 02/02/2018) for with Dr Louanne Skye to review MRI.   Orders:  No orders of the defined types were placed in this encounter.  No orders of the defined types were placed in this encounter.     Procedures: No procedures performed   Clinical Data: No additional findings.   Subjective: Chief Complaint  Patient presents with  . Lower Back - Pain    HPI Patient states that she has had minimal improvement with medications prescribed last office visit.  She continues to have ongoing pain across her low back with radiation into both hips. Review of Systems No current cardiac pulmonary GI GU issues  Objective: Vital Signs: LMP 02/13/2012   Physical Exam  Constitutional: She is oriented to person, place, and time. She appears well-developed. No distress.  HENT:  Head: Normocephalic and atraumatic.  Eyes: EOM are normal. Pupils are equal, round, and reactive to light.  Pulmonary/Chest: No respiratory distress.  Musculoskeletal:  Gait is antalgic.  She has limited lumbar flexion and extension due to pain.  Lumbar paraspinal tenderness.  Positive bilateral sciatic notch tenderness.  Negative logroll.  Neurovascularly intact.  Bilateral calves nontender.  Neurological: She is  alert and oriented to person, place, and time.    Ortho Exam  Specialty Comments:  No specialty comments available.  Imaging: No results found.   PMFS History: Patient Active Problem List   Diagnosis Date Noted  . HTN (hypertension) 02/21/2014  . SVT (supraventricular tachycardia) ([place]) 02/21/2014  . Postpartum hypertension   . Pelvic pain 11/01/2013  . DEPRESSION 05/30/2008  . Mitral valve disorder 05/30/2008  . ANAL FISSURE 05/30/2008  . BICUSPID AORTIC VALVE 05/30/2008  . DYSPHAGIA UNSPECIFIED 05/30/2008  . MIGRAINE HEADACHE 05/29/2008  . GERD 05/29/2008  . CONSTIPATION 05/29/2008  . IBS 05/29/2008  . EPIGASTRIC PAIN 05/29/2008  . Hx of endometriosis 12/24/2003   Past Medical History:  Diagnosis Date  . Acid reflux   . Aortic valve disorder    Bicuspid aortic valve  . Atypical chest pain   . Complication of anesthesia    nausea & vomiting and difficult time waking up  . Compound nevus 01/2012   dysplastic, melanocytic atypia  . Depression    crying spells for a year due to stress of  infertility  . Deviated septum    Dr Ruby Cola  . Endometriosis   . FH: thyroid condition   . Gestational hypertension   . H/O candidiasis   . H/O constipation   . H/O dyspareunia   . H/O varicella    childhood  . Headache(784.0)    migraine topomax  . Heart murmur  congential bicuspid aortic valve, mitral valve prolapse  . Herniated disc 2008  . HTN (hypertension)   . IBS (irritable bowel syndrome)   . Infertility, female   . Pelvic pain   . PONV (postoperative nausea and vomiting)    needs Scop patch  . Postpartum hypertension   . SVT (supraventricular tachycardia) ([place])   . UTI (lower urinary tract infection)   . Yeast infection     Family History  Problem Relation Age of Onset  . Diabetes Mother   . Hypertension Father   . Diabetes Father   . Heart disease Maternal Uncle   . Hypertension Maternal Grandmother   . Diabetes Maternal Grandmother   .  Heart disease Maternal Grandmother     Past Surgical History:  Procedure Laterality Date  . ABDOMINAL HYSTERECTOMY    . BUNIONECTOMY  1998  . CESAREAN SECTION  2010  . COLONOSCOPY WITH PROPOFOL N/A 08/20/2015   Procedure: COLONOSCOPY WITH PROPOFOL;  Surgeon: Arta Silence, MD;  Location: [facility];  Service: Endoscopy;  Laterality: N/A;  . dermoid cyst  removed   2010  . DILATION AND CURETTAGE OF UTERUS  12/31/10  . ENTEROSCOPY N/A 08/20/2015   Procedure: ENTEROSCOPY;  Surgeon: Arta Silence, MD;  Location: [facility];  Service: Endoscopy;  Laterality: N/A;  . FOOT SURGERY    . herniated disc surgery  2008  . LAPAROSCOPIC ENDOMETRIOSIS FULGURATION    . ROBOTIC ASSISTED TOTAL HYSTERECTOMY Bilateral 11/01/2013   Procedure: ROBOTIC ASSISTED TOTAL HYSTERECTOMY WITH BILATERAL SALPINOGECTOMY;  Surgeon: Alwyn Pea, MD;  Location: [facility];  Service: Gynecology;  Laterality: Bilateral;  . TONSILLECTOMY  1993   Social History   Occupational History  . Not on file  Tobacco Use  . Smoking status: Never Smoker  . Smokeless tobacco: Never Used  Substance and Sexual Activity  . Alcohol use: No  . Drug use: No  . Sexual activity: Yes    Birth control/protection: Pill

## 2018-01-31 DIAGNOSIS — Z01419 Encounter for gynecological examination (general) (routine) without abnormal findings: Secondary | ICD-10-CM | POA: Diagnosis not present

## 2018-01-31 DIAGNOSIS — R1032 Left lower quadrant pain: Secondary | ICD-10-CM | POA: Diagnosis not present

## 2018-01-31 DIAGNOSIS — Z1231 Encounter for screening mammogram for malignant neoplasm of breast: Secondary | ICD-10-CM | POA: Diagnosis not present

## 2018-02-13 ENCOUNTER — Ambulatory Visit (INDEPENDENT_AMBULATORY_CARE_PROVIDER_SITE_OTHER): Payer: 59 | Admitting: Specialist

## 2018-02-13 ENCOUNTER — Telehealth (INDEPENDENT_AMBULATORY_CARE_PROVIDER_SITE_OTHER): Payer: Self-pay | Admitting: Specialist

## 2018-02-13 NOTE — Telephone Encounter (Signed)
Can you please put an order in for MRI----Pt was seen by you on 01/12/2018 and was told she would have MRI done and then f/u with Dr. Louanne Skye afterwards for review.

## 2018-02-13 NOTE — Telephone Encounter (Signed)
Patient called stating that she has not been scheduled for her MRI.  I looked in her chart and I did not see an order for an MRI.  Please advise.  CB#[phone].  Thank you.

## 2018-02-14 ENCOUNTER — Other Ambulatory Visit (INDEPENDENT_AMBULATORY_CARE_PROVIDER_SITE_OTHER): Payer: Self-pay | Admitting: Surgery

## 2018-02-14 DIAGNOSIS — M5416 Radiculopathy, lumbar region: Secondary | ICD-10-CM

## 2018-02-14 NOTE — Telephone Encounter (Signed)
I put the order in.  Needs ROV with Nitka after.  Thanks

## 2018-02-25 DIAGNOSIS — J309 Allergic rhinitis, unspecified: Secondary | ICD-10-CM | POA: Diagnosis not present

## 2018-02-25 DIAGNOSIS — R509 Fever, unspecified: Secondary | ICD-10-CM | POA: Diagnosis not present

## 2018-02-25 DIAGNOSIS — J0300 Acute streptococcal tonsillitis, unspecified: Secondary | ICD-10-CM | POA: Diagnosis not present

## 2018-03-02 DIAGNOSIS — J029 Acute pharyngitis, unspecified: Secondary | ICD-10-CM | POA: Diagnosis not present

## 2018-03-02 DIAGNOSIS — J0100 Acute maxillary sinusitis, unspecified: Secondary | ICD-10-CM | POA: Diagnosis not present

## 2018-03-09 ENCOUNTER — Encounter (INDEPENDENT_AMBULATORY_CARE_PROVIDER_SITE_OTHER): Payer: Self-pay | Admitting: *Deleted

## 2018-03-24 ENCOUNTER — Other Ambulatory Visit: Payer: 59

## 2018-03-30 ENCOUNTER — Ambulatory Visit
Admission: RE | Admit: 2018-03-30 | Discharge: 2018-03-30 | Disposition: A | Discharge status: Home / Self Care | Payer: 59 | Source: Ambulatory Visit | Attending: Surgery | Admitting: Surgery

## 2018-03-30 DIAGNOSIS — M5416 Radiculopathy, lumbar region: Secondary | ICD-10-CM | POA: Diagnosis not present

## 2018-04-13 ENCOUNTER — Ambulatory Visit (INDEPENDENT_AMBULATORY_CARE_PROVIDER_SITE_OTHER): Payer: 59 | Admitting: Specialist

## 2018-04-13 ENCOUNTER — Encounter (INDEPENDENT_AMBULATORY_CARE_PROVIDER_SITE_OTHER): Payer: Self-pay | Admitting: Specialist

## 2018-04-13 VITALS — BP 135/86 | HR 94 | Ht 64.0 in | Wt 152.0 lb

## 2018-04-13 DIAGNOSIS — M5136 Other intervertebral disc degeneration, lumbar region: Secondary | ICD-10-CM

## 2018-04-13 DIAGNOSIS — M533 Sacrococcygeal disorders, not elsewhere classified: Secondary | ICD-10-CM | POA: Diagnosis not present

## 2018-04-13 NOTE — Progress Notes (Signed)
Office Visit Note   Patient: Madeline Everett           Date of Birth: 1976-08-16           MRN: [id number] Visit Date: 04/13/2018              Requested by: Harlan Stains, MD [place], [place] [address] PCP: Harlan Stains, MD   Assessment & Plan: Visit Diagnoses:  1. Degenerative disc disease, lumbar   2. Sacroiliac joint dysfunction     Plan: Avoid frequent bending and stooping  No lifting greater than 10 lbs. May use ice or moist heat for pain. Weight loss is of benefit.  Follow-Up Instructions: Return in about 6 weeks (around 05/25/2018).   Orders:  No orders of the defined types were placed in this encounter.  No orders of the defined types were placed in this encounter.     Procedures: No procedures performed   Clinical Data: No additional findings.   Subjective: Chief Complaint  Patient presents with  . Lower Back - Follow-up  . Results    MRI    HPI  Review of Systems   Objective: Vital Signs: BP 135/86   Pulse 94   Ht 5\' 4"  (1.626 m)   Wt 152 lb (68.9 kg)   LMP 02/13/2012   BMI 26.09 kg/m   Physical Exam  Ortho Exam  Specialty Comments:  No specialty comments available.  Imaging: No results found.   PMFS History: Patient Active Problem List   Diagnosis Date Noted  . HTN (hypertension) 02/21/2014  . SVT (supraventricular tachycardia) ([place]) 02/21/2014  . Postpartum hypertension   . Pelvic pain 11/01/2013  . DEPRESSION 05/30/2008  . Mitral valve disorder 05/30/2008  . ANAL FISSURE 05/30/2008  . BICUSPID AORTIC VALVE 05/30/2008  . DYSPHAGIA UNSPECIFIED 05/30/2008  . MIGRAINE HEADACHE 05/29/2008  . GERD 05/29/2008  . CONSTIPATION 05/29/2008  . IBS 05/29/2008  . EPIGASTRIC PAIN 05/29/2008  . Hx of endometriosis 12/24/2003   Past Medical History:  Diagnosis Date  . Acid reflux   . Aortic valve disorder    Bicuspid aortic valve  . Atypical chest pain   . Complication of anesthesia    nausea  & vomiting and difficult time waking up  . Compound nevus 01/2012   dysplastic, melanocytic atypia  . Depression    crying spells for a year due to stress of  infertility  . Deviated septum    Dr Ruby Cola  . Endometriosis   . FH: thyroid condition   . Gestational hypertension   . H/O candidiasis   . H/O constipation   . H/O dyspareunia   . H/O varicella    childhood  . Headache(784.0)    migraine topomax  . Heart murmur    congential bicuspid aortic valve, mitral valve prolapse  . Herniated disc 2008  . HTN (hypertension)   . IBS (irritable bowel syndrome)   . Infertility, female   . Pelvic pain   . PONV (postoperative nausea and vomiting)    needs Scop patch  . Postpartum hypertension   . SVT (supraventricular tachycardia) ([place])   . UTI (lower urinary tract infection)   . Yeast infection     Family History  Problem Relation Age of Onset  . Diabetes Mother   . Hypertension Father   . Diabetes Father   . Heart disease Maternal Uncle   . Hypertension Maternal Grandmother   . Diabetes Maternal Grandmother   .  Heart disease Maternal Grandmother     Past Surgical History:  Procedure Laterality Date  . ABDOMINAL HYSTERECTOMY    . BUNIONECTOMY  1998  . CESAREAN SECTION  2010  . COLONOSCOPY WITH PROPOFOL N/A 08/20/2015   Procedure: COLONOSCOPY WITH PROPOFOL;  Surgeon: Arta Silence, MD;  Location: [facility];  Service: Endoscopy;  Laterality: N/A;  . dermoid cyst  removed   2010  . DILATION AND CURETTAGE OF UTERUS  12/31/10  . ENTEROSCOPY N/A 08/20/2015   Procedure: ENTEROSCOPY;  Surgeon: Arta Silence, MD;  Location: [facility];  Service: Endoscopy;  Laterality: N/A;  . FOOT SURGERY    . herniated disc surgery  2008  . LAPAROSCOPIC ENDOMETRIOSIS FULGURATION    . ROBOTIC ASSISTED TOTAL HYSTERECTOMY Bilateral 11/01/2013   Procedure: ROBOTIC ASSISTED TOTAL HYSTERECTOMY WITH BILATERAL SALPINOGECTOMY;  Surgeon: Alwyn Pea, MD;  Location: [facility];  Service:  Gynecology;  Laterality: Bilateral;  . TONSILLECTOMY  1993   Social History   Occupational History  . Not on file  Tobacco Use  . Smoking status: Never Smoker  . Smokeless tobacco: Never Used  Substance and Sexual Activity  . Alcohol use: No  . Drug use: No  . Sexual activity: Yes    Birth control/protection: Pill

## 2018-04-13 NOTE — Patient Instructions (Signed)
Avoid frequent bending and stooping  No lifting greater than 10 lbs. May use ice or moist heat for pain. Weight loss is of benefit.   

## 2018-04-25 ENCOUNTER — Encounter: Payer: Self-pay | Admitting: Physical Therapy

## 2018-04-25 ENCOUNTER — Ambulatory Visit: Payer: 59 | Attending: Specialist | Admitting: Physical Therapy

## 2018-04-25 DIAGNOSIS — R262 Difficulty in walking, not elsewhere classified: Secondary | ICD-10-CM | POA: Insufficient documentation

## 2018-04-25 DIAGNOSIS — G8929 Other chronic pain: Secondary | ICD-10-CM | POA: Insufficient documentation

## 2018-04-25 DIAGNOSIS — M5416 Radiculopathy, lumbar region: Secondary | ICD-10-CM | POA: Insufficient documentation

## 2018-04-25 DIAGNOSIS — M5440 Lumbago with sciatica, unspecified side: Secondary | ICD-10-CM | POA: Diagnosis not present

## 2018-04-25 NOTE — Therapy (Signed)
[place], [place], [address] Phone: [phone]   Fax:  [phone]  Physical Therapy Evaluation  Patient Details  Name: Madeline Everett MRN: [id number] Date of Birth: 04-Dec-1975 Referring Provider: Dr. Louanne Skye   Encounter Date: 04/25/2018  PT End of Session - 04/25/18 1322    Visit Number  1    Number of Visits  12    Date for PT Re-Evaluation  06/06/18    PT Start Time  1102    PT Stop Time  1148    PT Time Calculation (min)  46 min    Activity Tolerance  Patient tolerated treatment well    Behavior During Therapy  [facility] for tasks assessed/performed       Past Medical History:  Diagnosis Date  . Acid reflux   . Aortic valve disorder    Bicuspid aortic valve  . Atypical chest pain   . Complication of anesthesia    nausea & vomiting and difficult time waking up  . Compound nevus 01/2012   dysplastic, melanocytic atypia  . Depression    crying spells for a year due to stress of  infertility  . Deviated septum    Dr Ruby Cola  . Endometriosis   . FH: thyroid condition   . Gestational hypertension   . H/O candidiasis   . H/O constipation   . H/O dyspareunia   . H/O varicella    childhood  . Headache(784.0)    migraine topomax  . Heart murmur    congential bicuspid aortic valve, mitral valve prolapse  . Herniated disc 2008  . HTN (hypertension)   . IBS (irritable bowel syndrome)   . Infertility, female   . Pelvic pain   . PONV (postoperative nausea and vomiting)    needs Scop patch  . Postpartum hypertension   . SVT (supraventricular tachycardia) ([place])   . UTI (lower urinary tract infection)   . Yeast infection     Past Surgical History:  Procedure Laterality Date  . ABDOMINAL HYSTERECTOMY    . BUNIONECTOMY  1998  . CESAREAN SECTION  2010  . COLONOSCOPY WITH PROPOFOL N/A 08/20/2015   Procedure: COLONOSCOPY WITH PROPOFOL;  Surgeon: Arta Silence, MD;  [facility]: [facility];   Service: Endoscopy;  Laterality: N/A;  . dermoid cyst  removed   2010  . DILATION AND CURETTAGE OF UTERUS  12/31/10  . ENTEROSCOPY N/A 08/20/2015   Procedure: ENTEROSCOPY;  Surgeon: Arta Silence, MD;  Location: [facility];  Service: Endoscopy;  Laterality: N/A;  . FOOT SURGERY    . herniated disc surgery  2008  . LAPAROSCOPIC ENDOMETRIOSIS FULGURATION    . ROBOTIC ASSISTED TOTAL HYSTERECTOMY Bilateral 11/01/2013   Procedure: ROBOTIC ASSISTED TOTAL HYSTERECTOMY WITH BILATERAL SALPINOGECTOMY;  Surgeon: Alwyn Pea, MD;  Location: [facility];  Service: Gynecology;  Laterality: Bilateral;  . TONSILLECTOMY  1993    There were no vitals filed for this visit.   Subjective Assessment - 04/25/18 1107    Subjective  She presents with chronic low back pain since her surgery, intermittently.  After her laminectomy she had full resolution of pain for many yrs.  She began to develop soreness "off and on" which she could manage.  About  3 mos ago felt a "giving way" as she did a forward fold (bending at the waist).  She then was unable to walk symmetrically, recovered a bit.  She then saw MD who prescribed meds, MRI.  Currently she has pain  in back, LLE and sensory changes in foot.  She finds she has to stay conscious of her posture, she cannot bend , lift or pull heavy loads.  She hopes to be able to stay ahead of her pain and prevent further injury.     Pertinent History  laminecomy / discectomy 2009 (L5-S1) , heart murmur    Limitations  Sitting;Lifting;Standing;Walking;House hold activities;Other (comment)    How long can you sit comfortably?  >1 hour , sometimes less     How long can you stand comfortably?  depends on the day, can be 1-2 hours    How long can you walk comfortably?  unsure, depends     Diagnostic tests  03/30/18: Disc degeneration and spurring left greater than right with mild left foraminal narrowing L5-S1    Patient Stated Goals  Pt would like to get back to pain level as rare and mild     Currently in Pain?  Yes    Pain Score  2     Pain Location  Back    Pain Orientation  Left    Pain Descriptors / Indicators  Discomfort;Aching    Pain Type  Chronic pain    Pain Radiating Towards  L thigh , buttock     Pain Onset  More than a month ago    Pain Frequency  Intermittent    Aggravating Factors   bending, lifting, sitting too long     Pain Relieving Factors  meds, ice , heat     Effect of Pain on Daily Activities  limits comfort with activity          [facility] PT Assessment - 04/25/18 0001      Assessment   Medical Diagnosis  lumbar strain, sprain, DDD L5    Referring Provider  Dr. Louanne Skye    Onset Date/Surgical Date  -- < 6 mos     Hand Dominance  Right    Next MD Visit  follow up     Prior Therapy  Yes x 2       Precautions   Precautions  None      Restrictions   Weight Bearing Restrictions  No      Balance Screen   Has the patient fallen in the past 6 months  No    Has the patient had a decrease in activity level because of a fear of falling?   No    Is the patient reluctant to leave their home because of a fear of falling?   No      Home Film/video editor residence    Living Arrangements  Spouse/significant other;Children    Additional Comments  no issues       Prior Function   Level of Independence  Independent    Vocation  Full time employment    Engineer, production , has a standing desk , mostly desk work     Leisure  reading, bowling, gardening, walks       Cognition   Overall Cognitive Status  Within Functional Limits for tasks assessed      Observation/Other Assessments   Focus on Therapeutic Outcomes (FOTO)   50%      Sensation   Light Touch  Impaired by gross assessment    Additional Comments  occ numbness in big toe and dorsum of L foot       AROM   Overall AROM Comments  Flexion NT, ext limited 25%,  all else WFL       PROM   Overall PROM Comments  hips WFL , pain L hip IR       Strength    Right Hip Flexion  5/5    Right Hip ABduction  5/5    Left Hip Flexion  5/5    Left Hip ABduction  4+/5    Right Knee Flexion  5/5    Right Knee Extension  5/5    Left Knee Flexion  4+/5    Left Knee Extension  5/5    Right Ankle Dorsiflexion  5/5    Left Ankle Dorsiflexion  5/5      Flexibility   Hamstrings  pain L end range 50 deg , Rt side tight but no back pain       Palpation   Spinal mobility  guarded, spasm    Palpation comment  pain ,tenderness throughout low lumbar L5-S1 into gluteals       Special Tests    Special Tests  Lumbar    Lumbar Tests  Slump Test;Straight Leg Raise      Slump test   Findings  Negative      Straight Leg Raise   Findings  Negative                Objective measurements completed on examination: See above findings.      [facility] Adult PT Treatment/Exercise - 04/25/18 0001      Self-Care   Self-Care  Posture;Heat/Ice Application;Other Self-Care Comments    Posture  avoid flexion in acute back pain     Heat/Ice Application  ice to relieve pain , inflammation     Other Self-Care Comments   HEP, POC       Lumbar Exercises: Stretches   Active Hamstring Stretch  3 reps      Lumbar Exercises: Supine   Ab Set  10 reps    Pelvic Tilt  10 reps      Cryotherapy   Number Minutes Cryotherapy  10 Minutes    Cryotherapy Location  Lumbar Spine    Type of Cryotherapy  Ice pack             PT Education - 04/25/18 1322    Education provided  Yes    Education Details  PT/POC, HEP, core, ice     Person(s) Educated  Patient    Methods  Explanation;Demonstration;Tactile cues;Verbal cues;Handout    Comprehension  Returned demonstration;Verbalized understanding          PT Long Term Goals - 04/25/18 1323      PT LONG TERM GOAL #1   Title  Pt will be score 37% or less on FOTO to demo functional improvement     Time  6    Period  Weeks    Status  New    Target Date  06/06/18      PT LONG TERM GOAL #2   Title  Pt will be  able to wake with pain <2/10 in AM most days of the week.     Time  6    Period  Weeks    Status  New    Target Date  06/06/18      PT LONG TERM GOAL #3   Title  Pt will be I with HEP for core, back/trunk, posture     Time  6    Period  Weeks    Status  New    Target Date  06/06/18      PT LONG TERM GOAL #4   Title  Pt will be able to lift laundry basket (or like object) without increased back pain     Time  6    Period  Weeks    Status  New    Target Date  06/06/18      PT LONG TERM GOAL #5   Title  Pt will be able to walk as needed for recreation, fitness without increase in lasting pain.     Time  6    Period  Weeks    Status  New    Target Date  06/06/18             Plan - 04/25/18 1425    Clinical Impression Statement  Patient presents for mod complexity eval of chronic, evolving back pain.  She does had radicular symptoms and sensory changes.  Numbness worsened in prone. She is cautious of how she moves for fear of aggravating her back but needs guidance and consistency with HEP.  She has normal strength, although pain inhibits her hip extension in prone, hard for her to isolate motion in her hip vs lumbar spine.  She should do very well with PT intervention.      History and Personal Factors relevant to plan of care:  previous back surgery    Clinical Presentation  Evolving    Clinical Presentation due to:  intermittent frequency of symptoms, sensory changes, radiating pain     Clinical Decision Making  Moderate    Rehab Potential  Excellent    PT Frequency  2x / week    PT Duration  6 weeks    PT Treatment/Interventions  ADLs/Self Care Home Management;Electrical Stimulation;Cryotherapy;Moist Heat;Ultrasound;Therapeutic exercise;Therapeutic activities;Taping;Manual techniques;Passive range of motion    PT Next Visit Plan  check HEP, manual/modalities to reduce pain (ice, IFC)    PT Home Exercise Plan  hamstring, piriformis, Tr A iso and PPT     Consulted and  Agree with Plan of Care  Patient       Patient will benefit from skilled therapeutic intervention in order to improve the following deficits and impairments:  Decreased mobility, Difficulty walking, Hypomobility, Increased muscle spasms, Impaired sensation, Decreased range of motion, Increased fascial restricitons, Decreased strength, Impaired flexibility, Pain  Visit Diagnosis: Chronic bilateral low back pain with sciatica, sciatica laterality unspecified  Radiculopathy, lumbar region  Difficulty in walking, not elsewhere classified     Problem List Patient Active Problem List   Diagnosis Date Noted  . HTN (hypertension) 02/21/2014  . SVT (supraventricular tachycardia) ([place]) 02/21/2014  . Postpartum hypertension   . Pelvic pain 11/01/2013  . DEPRESSION 05/30/2008  . Mitral valve disorder 05/30/2008  . ANAL FISSURE 05/30/2008  . BICUSPID AORTIC VALVE 05/30/2008  . DYSPHAGIA UNSPECIFIED 05/30/2008  . MIGRAINE HEADACHE 05/29/2008  . GERD 05/29/2008  . CONSTIPATION 05/29/2008  . IBS 05/29/2008  . EPIGASTRIC PAIN 05/29/2008  . Hx of endometriosis 12/24/2003    Khalel Alms 04/25/2018, 2:39 PM  [facility] [address]  [place], [place], [address] Phone: [phone]   Fax:  [phone]  Name: TYREA FROBERG MRN: [id number] Date of Birth: 1976/10/23  Raeford Razor, PT 04/25/18 2:39 PM Phone: [phone] Fax: [phone]

## 2018-04-26 ENCOUNTER — Encounter: Payer: Self-pay | Admitting: Physical Therapy

## 2018-04-26 ENCOUNTER — Ambulatory Visit: Payer: 59 | Admitting: Physical Therapy

## 2018-04-26 DIAGNOSIS — G8929 Other chronic pain: Secondary | ICD-10-CM

## 2018-04-26 DIAGNOSIS — R262 Difficulty in walking, not elsewhere classified: Secondary | ICD-10-CM

## 2018-04-26 DIAGNOSIS — M5440 Lumbago with sciatica, unspecified side: Principal | ICD-10-CM

## 2018-04-26 DIAGNOSIS — M5416 Radiculopathy, lumbar region: Secondary | ICD-10-CM

## 2018-04-26 NOTE — Therapy (Signed)
[place], [place], [address] Phone: [phone]   Fax:  [phone]  Physical Therapy Treatment  Patient Details  Name: Madeline Everett MRN: [id number] Date of Birth: 05-17-1976 Referring Provider: Dr. Louanne Skye   Encounter Date: 04/26/2018  PT End of Session - 04/26/18 1523    Visit Number  2    Number of Visits  12    Date for PT Re-Evaluation  06/06/18    PT Start Time  1339    PT Stop Time  1430    PT Time Calculation (min)  51 min    Activity Tolerance  Patient tolerated treatment well    Behavior During Therapy  [facility] for tasks assessed/performed       Past Medical History:  Diagnosis Date  . Acid reflux   . Aortic valve disorder    Bicuspid aortic valve  . Atypical chest pain   . Complication of anesthesia    nausea & vomiting and difficult time waking up  . Compound nevus 01/2012   dysplastic, melanocytic atypia  . Depression    crying spells for a year due to stress of  infertility  . Deviated septum    Dr Ruby Cola  . Endometriosis   . FH: thyroid condition   . Gestational hypertension   . H/O candidiasis   . H/O constipation   . H/O dyspareunia   . H/O varicella    childhood  . Headache(784.0)    migraine topomax  . Heart murmur    congential bicuspid aortic valve, mitral valve prolapse  . Herniated disc 2008  . HTN (hypertension)   . IBS (irritable bowel syndrome)   . Infertility, female   . Pelvic pain   . PONV (postoperative nausea and vomiting)    needs Scop patch  . Postpartum hypertension   . SVT (supraventricular tachycardia) ([place])   . UTI (lower urinary tract infection)   . Yeast infection     Past Surgical History:  Procedure Laterality Date  . ABDOMINAL HYSTERECTOMY    . BUNIONECTOMY  1998  . CESAREAN SECTION  2010  . COLONOSCOPY WITH PROPOFOL N/A 08/20/2015   Procedure: COLONOSCOPY WITH PROPOFOL;  Surgeon: Arta Silence, MD;  Location: [facility];   Service: Endoscopy;  Laterality: N/A;  . dermoid cyst  removed   2010  . DILATION AND CURETTAGE OF UTERUS  12/31/10  . ENTEROSCOPY N/A 08/20/2015   Procedure: ENTEROSCOPY;  Surgeon: Arta Silence, MD;  Location: [facility];  Service: Endoscopy;  Laterality: N/A;  . FOOT SURGERY    . herniated disc surgery  2008  . LAPAROSCOPIC ENDOMETRIOSIS FULGURATION    . ROBOTIC ASSISTED TOTAL HYSTERECTOMY Bilateral 11/01/2013   Procedure: ROBOTIC ASSISTED TOTAL HYSTERECTOMY WITH BILATERAL SALPINOGECTOMY;  Surgeon: Alwyn Pea, MD;  Location: [facility];  Service: Gynecology;  Laterality: Bilateral;  . TONSILLECTOMY  1993    There were no vitals filed for this visit.  Subjective Assessment - 04/26/18 1340    Subjective  Sore after Eval.  Pain is same as yesterday  Uncomfortable  tingling in foot  1/10,  She did not do HEP.     Currently in Pain?  Yes    Pain Score  1     Pain Location  Back                       [facility] Adult PT Treatment/Exercise - 04/26/18 0001      Self-Care  Posture  ADL handout,  briefly reviewed.      Lumbar Exercises: Stretches   Passive Hamstring Stretch  3 reps;30 seconds    Quad Stretch  3 reps;30 seconds    Piriformis Stretch  3 reps;30 seconds both good form.     Other Lumbar Stretch Exercise  qUADRATUS LUMBORUM stretches on side in quadriped and standing       Lumbar Exercises: Supine   Pelvic Tilt  5 reps    Bent Knee Raise  10 reps right only     Bridge  10 reps    Isometric Hip Flexion  5 reps;5 seconds left      Moist Heat Therapy   Number Minutes Moist Heat  15 Minutes    Moist Heat Location  Lumbar Spine Patient request vs ice             PT Education - 04/26/18 1352    Education provided  Yes    Education Details  ADL habdout,  HEP    Person(s) Educated  Patient    Methods  Explanation;Verbal cues;Handout;Demonstration;Tactile cues    Comprehension  Verbalized understanding;Returned demonstration          PT Long Term  Goals - 04/25/18 1323      PT LONG TERM GOAL #1   Title  Pt will be score 37% or less on FOTO to demo functional improvement     Time  6    Period  Weeks    Status  New    Target Date  06/06/18      PT LONG TERM GOAL #2   Title  Pt will be able to wake with pain <2/10 in AM most days of the week.     Time  6    Period  Weeks    Status  New    Target Date  06/06/18      PT LONG TERM GOAL #3   Title  Pt will be I with HEP for core, back/trunk, posture     Time  6    Period  Weeks    Status  New    Target Date  06/06/18      PT LONG TERM GOAL #4   Title  Pt will be able to lift laundry basket (or like object) without increased back pain     Time  6    Period  Weeks    Status  New    Target Date  06/06/18      PT LONG TERM GOAL #5   Title  Pt will be able to walk as needed for recreation, fitness without increase in lasting pain.     Time  6    Period  Weeks    Status  New    Target Date  06/06/18            Plan - 04/26/18 1524    Clinical Impression Statement  Patient presents with pelvic rotation with tigth Quadratus lumborum, hamstrings and quad left.  Stretching focus today She felt "Looser",  "Less tight" at end of session.  Less tender to palpation after session.    PT Next Visit Plan  check HEP, manual/modalities to reduce pain (ice, IFC) add quad stretch to HEP    PT Home Exercise Plan  hamstring, piriformis, Tr A iso and PPT Quadratus lumborum stretches    Consulted and Agree with Plan of Care  Patient       Patient will benefit from  skilled therapeutic intervention in order to improve the following deficits and impairments:     Visit Diagnosis: Chronic bilateral low back pain with sciatica, sciatica laterality unspecified  Radiculopathy, lumbar region  Difficulty in walking, not elsewhere classified     Problem List Patient Active Problem List   Diagnosis Date Noted  . HTN (hypertension) 02/21/2014  . SVT (supraventricular tachycardia) ([place])  02/21/2014  . Postpartum hypertension   . Pelvic pain 11/01/2013  . DEPRESSION 05/30/2008  . Mitral valve disorder 05/30/2008  . ANAL FISSURE 05/30/2008  . BICUSPID AORTIC VALVE 05/30/2008  . DYSPHAGIA UNSPECIFIED 05/30/2008  . MIGRAINE HEADACHE 05/29/2008  . GERD 05/29/2008  . CONSTIPATION 05/29/2008  . IBS 05/29/2008  . EPIGASTRIC PAIN 05/29/2008  . Hx of endometriosis 12/24/2003    HARRIS,KAREN PTA 04/26/2018, 3:28 PM  [facility] [address] [place], [place], [address] Phone: [phone]   Fax:  [phone]  Name: Madeline Everett MRN: [id number] Date of Birth: 05/21/1976

## 2018-04-26 NOTE — Patient Instructions (Addendum)

## 2018-05-01 ENCOUNTER — Ambulatory Visit: Payer: 59 | Attending: Specialist | Admitting: Physical Therapy

## 2018-05-01 ENCOUNTER — Encounter: Payer: Self-pay | Admitting: Physical Therapy

## 2018-05-01 ENCOUNTER — Ambulatory Visit: Payer: 59 | Admitting: Physical Therapy

## 2018-05-01 DIAGNOSIS — M5441 Lumbago with sciatica, right side: Secondary | ICD-10-CM

## 2018-05-01 DIAGNOSIS — G8929 Other chronic pain: Secondary | ICD-10-CM

## 2018-05-01 DIAGNOSIS — M5416 Radiculopathy, lumbar region: Secondary | ICD-10-CM

## 2018-05-01 DIAGNOSIS — R262 Difficulty in walking, not elsewhere classified: Secondary | ICD-10-CM

## 2018-05-01 DIAGNOSIS — M5440 Lumbago with sciatica, unspecified side: Secondary | ICD-10-CM | POA: Diagnosis not present

## 2018-05-01 NOTE — Therapy (Signed)
[place], [place], [address] Phone: [phone]   Fax:  [phone]  Physical Therapy Treatment  Patient Details  Name: Madeline Everett MRN: [id number] Date of Birth: 26-Feb-1976 Referring Provider: Dr. Louanne Skye   Encounter Date: 05/01/2018  PT End of Session - 05/01/18 1103    Visit Number  3    Number of Visits  12    Date for PT Re-Evaluation  06/06/18    PT Start Time  1100    PT Stop Time  1153    PT Time Calculation (min)  53 min       Past Medical History:  Diagnosis Date  . Acid reflux   . Aortic valve disorder    Bicuspid aortic valve  . Atypical chest pain   . Complication of anesthesia    nausea & vomiting and difficult time waking up  . Compound nevus 01/2012   dysplastic, melanocytic atypia  . Depression    crying spells for a year due to stress of  infertility  . Deviated septum    Dr Ruby Cola  . Endometriosis   . FH: thyroid condition   . Gestational hypertension   . H/O candidiasis   . H/O constipation   . H/O dyspareunia   . H/O varicella    childhood  . Headache(784.0)    migraine topomax  . Heart murmur    congential bicuspid aortic valve, mitral valve prolapse  . Herniated disc 2008  . HTN (hypertension)   . IBS (irritable bowel syndrome)   . Infertility, female   . Pelvic pain   . PONV (postoperative nausea and vomiting)    needs Scop patch  . Postpartum hypertension   . SVT (supraventricular tachycardia) ([place])   . UTI (lower urinary tract infection)   . Yeast infection     Past Surgical History:  Procedure Laterality Date  . ABDOMINAL HYSTERECTOMY    . BUNIONECTOMY  1998  . CESAREAN SECTION  2010  . COLONOSCOPY WITH PROPOFOL N/A 08/20/2015   Procedure: COLONOSCOPY WITH PROPOFOL;  Surgeon: Arta Silence, MD;  Location: [facility];  Service: Endoscopy;  Laterality: N/A;  . dermoid cyst  removed   2010  . DILATION AND CURETTAGE OF UTERUS  12/31/10  .  ENTEROSCOPY N/A 08/20/2015   Procedure: ENTEROSCOPY;  Surgeon: Arta Silence, MD;  Location: [facility];  Service: Endoscopy;  Laterality: N/A;  . FOOT SURGERY    . herniated disc surgery  2008  . LAPAROSCOPIC ENDOMETRIOSIS FULGURATION    . ROBOTIC ASSISTED TOTAL HYSTERECTOMY Bilateral 11/01/2013   Procedure: ROBOTIC ASSISTED TOTAL HYSTERECTOMY WITH BILATERAL SALPINOGECTOMY;  Surgeon: Alwyn Pea, MD;  Location: [facility];  Service: Gynecology;  Laterality: Bilateral;  . TONSILLECTOMY  1993    There were no vitals filed for this visit.  Subjective Assessment - 05/01/18 1102    Currently in Pain?  Yes    Pain Score  1     Pain Location  Back    Pain Orientation  Left    Pain Descriptors / Indicators  Aching    Pain Type  Chronic pain                       [facility] Adult PT Treatment/Exercise - 05/01/18 0001      Lumbar Exercises: Stretches   Passive Hamstring Stretch  3 reps;30 seconds supine with strap     Prone on Elbows Stretch  60 seconds  Quadruped Mid Back Stretch  60 seconds;3 reps    Quad Stretch  3 reps;30 seconds edge of mat    Piriformis Stretch  3 reps;30 seconds    Figure 4 Stretch  3 reps;30 seconds      Lumbar Exercises: Supine   Clam  20 reps blue    Bent Knee Raise  20 reps    Bridge  10 reps    Bridge with clamshell  10 reps blue      Lumbar Exercises: Quadruped   Madcat/Old Horse  10 reps                  PT Long Term Goals - 04/25/18 1323      PT LONG TERM GOAL #1   Title  Pt will be score 37% or less on FOTO to demo functional improvement     Time  6    Period  Weeks    Status  New    Target Date  06/06/18      PT LONG TERM GOAL #2   Title  Pt will be able to wake with pain <2/10 in AM most days of the week.     Time  6    Period  Weeks    Status  New    Target Date  06/06/18      PT LONG TERM GOAL #3   Title  Pt will be I with HEP for core, back/trunk, posture     Time  6    Period  Weeks    Status  New     Target Date  06/06/18      PT LONG TERM GOAL #4   Title  Pt will be able to lift laundry basket (or like object) without increased back pain     Time  6    Period  Weeks    Status  New    Target Date  06/06/18      PT LONG TERM GOAL #5   Title  Pt will be able to walk as needed for recreation, fitness without increase in lasting pain.     Time  6    Period  Weeks    Status  New    Target Date  06/06/18            Plan - 05/01/18 1109    Clinical Impression Statement  Pt reports stretches are helping. She felt relief after quadratus stretches last visit. Pain 3/10 in the morning. Pain can increase at work up to 6/10. SHe is working on posture and taking standing rest breaks to avoid pain increase. Today her pelvis seems level. Continued core strength and stretching as tolerated. Will plan to update HEP based on response to today's treatment.    PT Next Visit Plan  check HEP, manual/modalities to reduce pain (ice, IFC) add quad stretch to HEP, add core    PT Home Exercise Plan  hamstring, piriformis, Tr A iso and PPT Quadratus lumborum stretches    Consulted and Agree with Plan of Care  Patient       Patient will benefit from skilled therapeutic intervention in order to improve the following deficits and impairments:  Decreased mobility, Difficulty walking, Hypomobility, Increased muscle spasms, Impaired sensation, Decreased range of motion, Increased fascial restricitons, Decreased strength, Impaired flexibility, Pain  Visit Diagnosis: Chronic bilateral low back pain with sciatica, sciatica laterality unspecified  Radiculopathy, lumbar region  Difficulty in walking, not elsewhere classified  Problem List Patient Active Problem List   Diagnosis Date Noted  . HTN (hypertension) 02/21/2014  . SVT (supraventricular tachycardia) ([place]) 02/21/2014  . Postpartum hypertension   . Pelvic pain 11/01/2013  . DEPRESSION 05/30/2008  . Mitral valve disorder 05/30/2008  . ANAL  FISSURE 05/30/2008  . BICUSPID AORTIC VALVE 05/30/2008  . DYSPHAGIA UNSPECIFIED 05/30/2008  . MIGRAINE HEADACHE 05/29/2008  . GERD 05/29/2008  . CONSTIPATION 05/29/2008  . IBS 05/29/2008  . EPIGASTRIC PAIN 05/29/2008  . Hx of endometriosis 12/24/2003    Dorene Ar , PTA 05/01/2018, 11:47 AM  [facility] [address] [place], [place], [address] Phone: [phone]   Fax:  [phone]  Name: Madeline Everett MRN: [id number] Date of Birth: [birth year]

## 2018-05-03 ENCOUNTER — Ambulatory Visit: Payer: 59 | Admitting: Physical Therapy

## 2018-05-03 ENCOUNTER — Encounter: Payer: Self-pay | Admitting: Physical Therapy

## 2018-05-03 DIAGNOSIS — M5440 Lumbago with sciatica, unspecified side: Principal | ICD-10-CM

## 2018-05-03 DIAGNOSIS — G8929 Other chronic pain: Secondary | ICD-10-CM

## 2018-05-03 DIAGNOSIS — M5416 Radiculopathy, lumbar region: Secondary | ICD-10-CM

## 2018-05-03 DIAGNOSIS — R262 Difficulty in walking, not elsewhere classified: Secondary | ICD-10-CM

## 2018-05-03 NOTE — Therapy (Signed)
[place], [place], [address] Phone: [phone]   Fax:  [phone]  Physical Therapy Treatment  Patient Details  Name: Madeline Everett MRN: [id number] Date of Birth: 1975-12-31 Referring Provider: Dr. Louanne Skye   Encounter Date: 05/03/2018  PT End of Session - 05/03/18 1303    Visit Number  4    Number of Visits  12    Date for PT Re-Evaluation  06/06/18    PT Start Time  1020    PT Stop Time  1117    PT Time Calculation (min)  57 min    Activity Tolerance  Patient tolerated treatment well    Behavior During Therapy  [facility] for tasks assessed/performed       Past Medical History:  Diagnosis Date  . Acid reflux   . Aortic valve disorder    Bicuspid aortic valve  . Atypical chest pain   . Complication of anesthesia    nausea & vomiting and difficult time waking up  . Compound nevus 01/2012   dysplastic, melanocytic atypia  . Depression    crying spells for a year due to stress of  infertility  . Deviated septum    Dr Ruby Cola  . Endometriosis   . FH: thyroid condition   . Gestational hypertension   . H/O candidiasis   . H/O constipation   . H/O dyspareunia   . H/O varicella    childhood  . Headache(784.0)    migraine topomax  . Heart murmur    congential bicuspid aortic valve, mitral valve prolapse  . Herniated disc 2008  . HTN (hypertension)   . IBS (irritable bowel syndrome)   . Infertility, female   . Pelvic pain   . PONV (postoperative nausea and vomiting)    needs Scop patch  . Postpartum hypertension   . SVT (supraventricular tachycardia) ([place])   . UTI (lower urinary tract infection)   . Yeast infection     Past Surgical History:  Procedure Laterality Date  . ABDOMINAL HYSTERECTOMY    . BUNIONECTOMY  1998  . CESAREAN SECTION  2010  . COLONOSCOPY WITH PROPOFOL N/A 08/20/2015   Procedure: COLONOSCOPY WITH PROPOFOL;  Surgeon: Arta Silence, MD;  Location: [facility];   Service: Endoscopy;  Laterality: N/A;  . dermoid cyst  removed   2010  . DILATION AND CURETTAGE OF UTERUS  12/31/10  . ENTEROSCOPY N/A 08/20/2015   Procedure: ENTEROSCOPY;  Surgeon: Arta Silence, MD;  Location: [facility];  Service: Endoscopy;  Laterality: N/A;  . FOOT SURGERY    . herniated disc surgery  2008  . LAPAROSCOPIC ENDOMETRIOSIS FULGURATION    . ROBOTIC ASSISTED TOTAL HYSTERECTOMY Bilateral 11/01/2013   Procedure: ROBOTIC ASSISTED TOTAL HYSTERECTOMY WITH BILATERAL SALPINOGECTOMY;  Surgeon: Alwyn Pea, MD;  Location: [facility];  Service: Gynecology;  Laterality: Bilateral;  . TONSILLECTOMY  1993    There were no vitals filed for this visit.  Subjective Assessment - 05/03/18 1023    Subjective  has not needed pain meds.  Sore after last visit,  was able to do exercises again and did not get sore.   Less thigh paine,  none this am.    Currently in Pain?  Yes    Pain Score  -- mild no #    Pain Location  Back    Pain Orientation  Right    Aggravating Factors   LTR to left    Pain Relieving Factors  exercises,  ice,  heat    Multiple Pain Sites  Yes                       [facility] Adult PT Treatment/Exercise - 05/03/18 0001      Lumbar Exercises: Stretches   Passive Hamstring Stretch  3 reps;30 seconds supine with strap     Prone on Elbows Stretch  60 seconds    Quadruped Mid Back Stretch  60 seconds;3 reps    Quad Stretch  3 reps;30 seconds edge of mat    Piriformis Stretch  3 reps;30 seconds    Figure 4 Stretch  3 reps;30 seconds      Lumbar Exercises: Supine   Other Supine Lumbar Exercises  muscle energy right thigh hip flexion to level pelvis 2 sets      Lumbar Exercises: Quadruped   Madcat/Old Horse  -- 3 X 30,  intermittant with child's pose.    stretching left.      Moist Heat Therapy   Number Minutes Moist Heat  15 Minutes    Moist Heat Location  Lumbar Spine concurrent with IFC      Electrical Stimulation   Electrical Stimulation Location   lumbar    Electrical Stimulation Action  IFC    Electrical Stimulation Parameters  to tolerance  10    Electrical Stimulation Goals  Pain             PT Education - 05/03/18 1303    Education provided  Yes    Education Details  Hoe IFC Helps pain    Person(s) Educated  Patient    Methods  Explanation    Comprehension  Verbalized understanding          PT Long Term Goals - 04/25/18 1323      PT LONG TERM GOAL #1   Title  Pt will be score 37% or less on FOTO to demo functional improvement     Time  6    Period  Weeks    Status  New    Target Date  06/06/18      PT LONG TERM GOAL #2   Title  Pt will be able to wake with pain <2/10 in AM most days of the week.     Time  6    Period  Weeks    Status  New    Target Date  06/06/18      PT LONG TERM GOAL #3   Title  Pt will be I with HEP for core, back/trunk, posture     Time  6    Period  Weeks    Status  New    Target Date  06/06/18      PT LONG TERM GOAL #4   Title  Pt will be able to lift laundry basket (or like object) without increased back pain     Time  6    Period  Weeks    Status  New    Target Date  06/06/18      PT LONG TERM GOAL #5   Title  Pt will be able to walk as needed for recreation, fitness without increase in lasting pain.     Time  6    Period  Weeks    Status  New    Target Date  06/06/18            Plan - 05/03/18 1304    Clinical Impression Statement  Patient able to  level hips with muscle energy.  Stretching and stabilization exercises tolerated with mild increases in pain.  She has a big vacation to [place] this weekend and she should  know What to do if pain increases.  i asked her to avoid steps and hills.    PT Next Visit Plan  check HEP,  Check SI levels,  See how vacation went.  Progress exercise as able  stabilization    PT Home Exercise Plan  hamstring, piriformis, Tr A iso and PPT Quadratus lumborum stretches    Consulted and Agree with Plan of Care  Patient        Patient will benefit from skilled therapeutic intervention in order to improve the following deficits and impairments:     Visit Diagnosis: Chronic bilateral low back pain with sciatica, sciatica laterality unspecified  Radiculopathy, lumbar region  Difficulty in walking, not elsewhere classified     Problem List Patient Active Problem List   Diagnosis Date Noted  . HTN (hypertension) 02/21/2014  . SVT (supraventricular tachycardia) ([place]) 02/21/2014  . Postpartum hypertension   . Pelvic pain 11/01/2013  . DEPRESSION 05/30/2008  . Mitral valve disorder 05/30/2008  . ANAL FISSURE 05/30/2008  . BICUSPID AORTIC VALVE 05/30/2008  . DYSPHAGIA UNSPECIFIED 05/30/2008  . MIGRAINE HEADACHE 05/29/2008  . GERD 05/29/2008  . CONSTIPATION 05/29/2008  . IBS 05/29/2008  . EPIGASTRIC PAIN 05/29/2008  . Hx of endometriosis 12/24/2003    HARRIS,KAREN  PTA 05/03/2018, 1:08 PM  [facility] [address] [place], [place], [address] Phone: [phone]   Fax:  [phone]  Name: Madeline Everett MRN: [id number] Date of Birth: November 23, 1976

## 2018-05-16 ENCOUNTER — Ambulatory Visit: Payer: 59 | Admitting: Physical Therapy

## 2018-05-16 ENCOUNTER — Encounter: Payer: Self-pay | Admitting: Physical Therapy

## 2018-05-16 DIAGNOSIS — M5416 Radiculopathy, lumbar region: Secondary | ICD-10-CM

## 2018-05-16 DIAGNOSIS — M5440 Lumbago with sciatica, unspecified side: Secondary | ICD-10-CM | POA: Diagnosis not present

## 2018-05-16 DIAGNOSIS — G8929 Other chronic pain: Secondary | ICD-10-CM

## 2018-05-16 DIAGNOSIS — R262 Difficulty in walking, not elsewhere classified: Secondary | ICD-10-CM

## 2018-05-16 NOTE — Patient Instructions (Signed)
Given pilates bridging, bent knee fall out and "scissors" from [employer] Pilates handout for lower abdominals, stabilization.

## 2018-05-16 NOTE — Therapy (Signed)
[place], [place], [address] Phone: [phone]   Fax:  [phone]  Physical Therapy Treatment  Patient Details  Name: Madeline Everett MRN: [id number] Date of Birth: Jan 01, 1976 Referring Provider: Dr. Louanne Skye   Encounter Date: 05/16/2018  PT End of Session - 05/16/18 1759    Visit Number  5    Number of Visits  12    Date for PT Re-Evaluation  06/06/18    PT Start Time  1975    PT Stop Time  1645    PT Time Calculation (min)  51 min    Activity Tolerance  Patient tolerated treatment well    Behavior During Therapy  [facility] for tasks assessed/performed       Past Medical History:  Diagnosis Date  . Acid reflux   . Aortic valve disorder    Bicuspid aortic valve  . Atypical chest pain   . Complication of anesthesia    nausea & vomiting and difficult time waking up  . Compound nevus 01/2012   dysplastic, melanocytic atypia  . Depression    crying spells for a year due to stress of  infertility  . Deviated septum    Dr Ruby Cola  . Endometriosis   . FH: thyroid condition   . Gestational hypertension   . H/O candidiasis   . H/O constipation   . H/O dyspareunia   . H/O varicella    childhood  . Headache(784.0)    migraine topomax  . Heart murmur    congential bicuspid aortic valve, mitral valve prolapse  . Herniated disc 2008  . HTN (hypertension)   . IBS (irritable bowel syndrome)   . Infertility, female   . Pelvic pain   . PONV (postoperative nausea and vomiting)    needs Scop patch  . Postpartum hypertension   . SVT (supraventricular tachycardia) ([place])   . UTI (lower urinary tract infection)   . Yeast infection     Past Surgical History:  Procedure Laterality Date  . ABDOMINAL HYSTERECTOMY    . BUNIONECTOMY  1998  . CESAREAN SECTION  2010  . COLONOSCOPY WITH PROPOFOL N/A 08/20/2015   Procedure: COLONOSCOPY WITH PROPOFOL;  Surgeon: Arta Silence, MD;  Location: [facility];   Service: Endoscopy;  Laterality: N/A;  . dermoid cyst  removed   2010  . DILATION AND CURETTAGE OF UTERUS  12/31/10  . ENTEROSCOPY N/A 08/20/2015   Procedure: ENTEROSCOPY;  Surgeon: Arta Silence, MD;  Location: [facility];  Service: Endoscopy;  Laterality: N/A;  . FOOT SURGERY    . herniated disc surgery  2008  . LAPAROSCOPIC ENDOMETRIOSIS FULGURATION    . ROBOTIC ASSISTED TOTAL HYSTERECTOMY Bilateral 11/01/2013   Procedure: ROBOTIC ASSISTED TOTAL HYSTERECTOMY WITH BILATERAL SALPINOGECTOMY;  Surgeon: Alwyn Pea, MD;  Location: [facility];  Service: Gynecology;  Laterality: Bilateral;  . TONSILLECTOMY  1993    There were no vitals filed for this visit.  Subjective Assessment - 05/16/18 1555    Subjective  Was at [facility].  Back was OK most of the time.  Did her exercises 2 x per day, seemed to help.  more in the hip/buttocks right now. Has been doing resisted L hip flexion (MET) each day, seemed to help.  Walking in sand was hard.     Currently in Pain?  Yes           [facility] Adult PT Treatment/Exercise - 05/16/18 0001      Self-Care   Other  Self-Care Comments   MET for L posterior rotation      Lumbar Exercises: Stretches   Single Knee to Chest Stretch  3 reps;30 seconds over foam roller    Hip Flexor Stretch  2 reps      Lumbar Exercises: Supine   Ab Set  10 reps    Clam  20 reps    Bent Knee Raise  20 reps    Bridge  10 reps    Other Supine Lumbar Exercises  L thigh hip flexion MET       Moist Heat Therapy   Number Minutes Moist Heat  15 Minutes    Moist Heat Location  Lumbar Spine      Electrical Stimulation   Electrical Stimulation Location  lumbar    Electrical Stimulation Action  IFC    Electrical Stimulation Parameters  12 prone     Electrical Stimulation Goals  Pain      Manual Therapy   Manual Therapy  Manual Traction;Muscle Energy Technique    Manual Traction  LAD     Muscle Energy Technique  Lt. post innominate              PT Education -  05/16/18 1758    Education provided  Yes    Education Details  Core HEP, MET     Person(s) Educated  Patient    Methods  Explanation;Demonstration;Handout;Tactile cues;Verbal cues    Comprehension  Verbalized understanding          PT Long Term Goals - 05/16/18 1759      PT LONG TERM GOAL #1   Title  Pt will be score 37% or less on FOTO to demo functional improvement     Status  On-going      PT LONG TERM GOAL #2   Title  Pt will be able to wake with pain <2/10 in AM most days of the week.     Status  On-going      PT LONG TERM GOAL #3   Title  Pt will be I with HEP for core, back/trunk, posture     Status  On-going      PT LONG TERM GOAL #4   Title  Pt will be able to lift laundry basket (or like object) without increased back pain     Status  On-going      PT LONG TERM GOAL #5   Title  Pt will be able to walk as needed for recreation, fitness without increase in lasting pain.     Status  On-going            Plan - 05/16/18 1800    Clinical Impression Statement  USed MET for L pelvis asymmetry, used mirror for visual check needed cues for finding ASIS. Increased soreness post exercise. Given core HEP, repeat if IFC as it helps.  Her stretches are helping her pain.     PT Treatment/Interventions  ADLs/Self Care Home Management;Electrical Stimulation;Cryotherapy;Moist Heat;Ultrasound;Therapeutic exercise;Therapeutic activities;Taping;Manual techniques;Passive range of motion    PT Next Visit Plan  check core HEP,  Check SI levels,  .  Progress exercise as able  stabilization    PT Home Exercise Plan  hamstring, piriformis, Tr A iso and PPT Quadratus lumborum stretches, bent knee fall out, bridging and reverse march (scissor)     Consulted and Agree with Plan of Care  Patient       Patient will benefit from skilled therapeutic intervention in order to improve the  following deficits and impairments:  Decreased mobility, Difficulty walking, Hypomobility, Increased muscle  spasms, Impaired sensation, Decreased range of motion, Increased fascial restricitons, Decreased strength, Impaired flexibility, Pain  Visit Diagnosis: Chronic bilateral low back pain with sciatica, sciatica laterality unspecified  Radiculopathy, lumbar region  Difficulty in walking, not elsewhere classified     Problem List Patient Active Problem List   Diagnosis Date Noted  . HTN (hypertension) 02/21/2014  . SVT (supraventricular tachycardia) ([place]) 02/21/2014  . Postpartum hypertension   . Pelvic pain 11/01/2013  . DEPRESSION 05/30/2008  . Mitral valve disorder 05/30/2008  . ANAL FISSURE 05/30/2008  . BICUSPID AORTIC VALVE 05/30/2008  . DYSPHAGIA UNSPECIFIED 05/30/2008  . MIGRAINE HEADACHE 05/29/2008  . GERD 05/29/2008  . CONSTIPATION 05/29/2008  . IBS 05/29/2008  . EPIGASTRIC PAIN 05/29/2008  . Hx of endometriosis 12/24/2003    PAA,JENNIFER 05/16/2018, 6:06 PM  [facility]  [address] [place], [place], [address] Phone: [phone]   Fax:  [phone]  Name: GRAZIA TAFFE MRN: [id number] Date of Birth: Dec 21, 1975  Raeford Razor, PT 05/16/18 6:08 PM Phone: [phone] Fax: [phone]

## 2018-05-18 ENCOUNTER — Encounter: Payer: Self-pay | Admitting: Physical Therapy

## 2018-05-18 ENCOUNTER — Ambulatory Visit: Payer: 59 | Admitting: Physical Therapy

## 2018-05-18 DIAGNOSIS — R262 Difficulty in walking, not elsewhere classified: Secondary | ICD-10-CM

## 2018-05-18 DIAGNOSIS — M5440 Lumbago with sciatica, unspecified side: Principal | ICD-10-CM

## 2018-05-18 DIAGNOSIS — G8929 Other chronic pain: Secondary | ICD-10-CM

## 2018-05-18 DIAGNOSIS — M5416 Radiculopathy, lumbar region: Secondary | ICD-10-CM

## 2018-05-18 DIAGNOSIS — M5441 Lumbago with sciatica, right side: Secondary | ICD-10-CM

## 2018-05-18 NOTE — Patient Instructions (Signed)
Transversus abdominus exercises added to HEP from ex drawer. All issued 10 x each Hold 3-5 seconds PRN for days when fatigued.

## 2018-05-18 NOTE — Therapy (Signed)
[place], [place], [address] Phone: [phone]   Fax:  [phone]  Physical Therapy Treatment  Patient Details  Name: Madeline Everett MRN: [id number] Date of Birth: 1975/12/03 Referring Provider: Dr. Louanne Skye   Encounter Date: 05/18/2018  PT End of Session - 05/18/18 1636    Visit Number  6    Number of Visits  12    Date for PT Re-Evaluation  06/06/18    PT Start Time  2992 charge will not equal time slot patient late ( used heat)    PT Stop Time  1640    PT Time Calculation (min)  45 min    Activity Tolerance  Patient tolerated treatment well    Behavior During Therapy  [facility] for tasks assessed/performed       Past Medical History:  Diagnosis Date  . Acid reflux   . Aortic valve disorder    Bicuspid aortic valve  . Atypical chest pain   . Complication of anesthesia    nausea & vomiting and difficult time waking up  . Compound nevus 01/2012   dysplastic, melanocytic atypia  . Depression    crying spells for a year due to stress of  infertility  . Deviated septum    Dr Ruby Cola  . Endometriosis   . FH: thyroid condition   . Gestational hypertension   . H/O candidiasis   . H/O constipation   . H/O dyspareunia   . H/O varicella    childhood  . Headache(784.0)    migraine topomax  . Heart murmur    congential bicuspid aortic valve, mitral valve prolapse  . Herniated disc 2008  . HTN (hypertension)   . IBS (irritable bowel syndrome)   . Infertility, female   . Pelvic pain   . PONV (postoperative nausea and vomiting)    needs Scop patch  . Postpartum hypertension   . SVT (supraventricular tachycardia) ([place])   . UTI (lower urinary tract infection)   . Yeast infection     Past Surgical History:  Procedure Laterality Date  . ABDOMINAL HYSTERECTOMY    . BUNIONECTOMY  1998  . CESAREAN SECTION  2010  . COLONOSCOPY WITH PROPOFOL N/A 08/20/2015   Procedure: COLONOSCOPY WITH PROPOFOL;   Surgeon: Arta Silence, MD;  Location: [facility];  Service: Endoscopy;  Laterality: N/A;  . dermoid cyst  removed   2010  . DILATION AND CURETTAGE OF UTERUS  12/31/10  . ENTEROSCOPY N/A 08/20/2015   Procedure: ENTEROSCOPY;  Surgeon: Arta Silence, MD;  Location: [facility];  Service: Endoscopy;  Laterality: N/A;  . FOOT SURGERY    . herniated disc surgery  2008  . LAPAROSCOPIC ENDOMETRIOSIS FULGURATION    . ROBOTIC ASSISTED TOTAL HYSTERECTOMY Bilateral 11/01/2013   Procedure: ROBOTIC ASSISTED TOTAL HYSTERECTOMY WITH BILATERAL SALPINOGECTOMY;  Surgeon: Alwyn Pea, MD;  Location: [facility];  Service: Gynecology;  Laterality: Bilateral;  . TONSILLECTOMY  1993    There were no vitals filed for this visit.  Subjective Assessment - 05/18/18 1557    Subjective  Has a headach.  2/10 pain.  Stretches are getting done. Pain less frequent in back and hip.  When I am out of wack I walk with a twist.      Currently in Pain?  Yes    Pain Score  2     Pain Location  Back    Pain Orientation  Right    Pain Descriptors / Indicators  Aching  Pain Type  Chronic pain    Pain Frequency  Intermittent    Aggravating Factors   walking,  soft sand walking,  poor support in chairs.  LTR to left    Pain Relieving Factors  stretching,  exercise    Effect of Pain on Daily Activities  limits some activities.                         [facility] Adult PT Treatment/Exercise - 05/18/18 0001      Lumbar Exercises: Stretches   Passive Hamstring Stretch  2 reps;30 seconds    Figure 4 Stretch  3 reps;30 seconds      Lumbar Exercises: Supine   Bridge  10 reps    Other Supine Lumbar Exercises  Tra  exercise 7x  HEP,  also   x5 bent knee out.,  bent knee raise, and heel slides.     Other Supine Lumbar Exercises  Right thigh hip flexion MET  usually the left needs this.      Moist Heat Therapy   Number Minutes Moist Heat  10 Minutes    Moist Heat Location  Lumbar Spine             PT  Education - 05/18/18 1635    Education provided  Yes    Education Details  HEP.    Person(s) Educated  Patient    Methods  Explanation;Tactile cues;Verbal cues;Handout    Comprehension  Verbalized understanding;Returned demonstration          PT Long Term Goals - 05/16/18 1759      PT LONG TERM GOAL #1   Title  Pt will be score 37% or less on FOTO to demo functional improvement     Status  On-going      PT LONG TERM GOAL #2   Title  Pt will be able to wake with pain <2/10 in AM most days of the week.     Status  On-going      PT LONG TERM GOAL #3   Title  Pt will be I with HEP for core, back/trunk, posture     Status  On-going      PT LONG TERM GOAL #4   Title  Pt will be able to lift laundry basket (or like object) without increased back pain     Status  On-going      PT LONG TERM GOAL #5   Title  Pt will be able to walk as needed for recreation, fitness without increase in lasting pain.     Status  On-going            Plan - 05/18/18 1730    Clinical Impression Statement  Patient Pelvis /  low back pain in the left.  Able to decrease pain to 1/10 post session.  TrA  exercises added to HEP as an option for exercise on days she is fatigued.     PT Next Visit Plan  check core HEP,  Check SI levels,  .  Progress exercise as able  stabilization    PT Home Exercise Plan  hamstring, piriformis, Tr A iso and PPT Quadratus lumborum stretches, bent knee fall out, bridging and reverse march (scissor) TrA series from ex drawer    Consulted and Agree with Plan of Care  Patient       Patient will benefit from skilled therapeutic intervention in order to improve the following deficits and impairments:  Visit Diagnosis: Chronic bilateral low back pain with sciatica, sciatica laterality unspecified  Radiculopathy, lumbar region  Difficulty in walking, not elsewhere classified     Problem List Patient Active Problem List   Diagnosis Date Noted  . HTN (hypertension)  02/21/2014  . SVT (supraventricular tachycardia) ([place]) 02/21/2014  . Postpartum hypertension   . Pelvic pain 11/01/2013  . DEPRESSION 05/30/2008  . Mitral valve disorder 05/30/2008  . ANAL FISSURE 05/30/2008  . BICUSPID AORTIC VALVE 05/30/2008  . DYSPHAGIA UNSPECIFIED 05/30/2008  . MIGRAINE HEADACHE 05/29/2008  . GERD 05/29/2008  . CONSTIPATION 05/29/2008  . IBS 05/29/2008  . EPIGASTRIC PAIN 05/29/2008  . Hx of endometriosis 12/24/2003    Jailee Jaquez PTA 05/18/2018, 5:33 PM  [place], [place], [address] Phone: [phone]   Fax:  [phone]  Name: Madeline Everett MRN: [id number] Date of Birth: 19-Jul-1976

## 2018-05-23 ENCOUNTER — Encounter: Payer: Self-pay | Admitting: Physical Therapy

## 2018-05-23 ENCOUNTER — Ambulatory Visit: Payer: 59 | Admitting: Physical Therapy

## 2018-05-23 DIAGNOSIS — G8929 Other chronic pain: Secondary | ICD-10-CM

## 2018-05-23 DIAGNOSIS — J029 Acute pharyngitis, unspecified: Secondary | ICD-10-CM | POA: Diagnosis not present

## 2018-05-23 DIAGNOSIS — R262 Difficulty in walking, not elsewhere classified: Secondary | ICD-10-CM

## 2018-05-23 DIAGNOSIS — M5440 Lumbago with sciatica, unspecified side: Principal | ICD-10-CM

## 2018-05-23 DIAGNOSIS — M5416 Radiculopathy, lumbar region: Secondary | ICD-10-CM

## 2018-05-23 DIAGNOSIS — R509 Fever, unspecified: Secondary | ICD-10-CM | POA: Diagnosis not present

## 2018-05-23 NOTE — Therapy (Signed)
[place], [place], [address] Phone: [phone]   Fax:  [phone]  Physical Therapy Treatment  Patient Details  Name: Madeline Everett MRN: [id number] Date of Birth: [birth year] Referring Provider: Dr. Louanne Skye   Encounter Date: 05/23/2018  PT End of Session - 05/23/18 1602    Visit Number  7    Number of Visits  12    Date for PT Re-Evaluation  06/06/18    PT Start Time  1555 pt 10 min late     PT Stop Time  1630    PT Time Calculation (min)  35 min    Activity Tolerance  Patient tolerated treatment well    Behavior During Therapy  [facility] for tasks assessed/performed       Past Medical History:  Diagnosis Date  . Acid reflux   . Aortic valve disorder    Bicuspid aortic valve  . Atypical chest pain   . Complication of anesthesia    nausea & vomiting and difficult time waking up  . Compound nevus 01/2012   dysplastic, melanocytic atypia  . Depression    crying spells for a year due to stress of  infertility  . Deviated septum    Dr Ruby Cola  . Endometriosis   . FH: thyroid condition   . Gestational hypertension   . H/O candidiasis   . H/O constipation   . H/O dyspareunia   . H/O varicella    childhood  . Headache(784.0)    migraine topomax  . Heart murmur    congential bicuspid aortic valve, mitral valve prolapse  . Herniated disc 2008  . HTN (hypertension)   . IBS (irritable bowel syndrome)   . Infertility, female   . Pelvic pain   . PONV (postoperative nausea and vomiting)    needs Scop patch  . Postpartum hypertension   . SVT (supraventricular tachycardia) ([place])   . UTI (lower urinary tract infection)   . Yeast infection     Past Surgical History:  Procedure Laterality Date  . ABDOMINAL HYSTERECTOMY    . BUNIONECTOMY  1998  . CESAREAN SECTION  2010  . COLONOSCOPY WITH PROPOFOL N/A 08/20/2015   Procedure: COLONOSCOPY WITH PROPOFOL;  Surgeon: Arta Silence, MD;  [facility];  Service: Endoscopy;  Laterality: N/A;  . dermoid cyst  removed   2010  . DILATION AND CURETTAGE OF UTERUS  12/31/10  . ENTEROSCOPY N/A 08/20/2015   Procedure: ENTEROSCOPY;  Surgeon: Arta Silence, MD;  [facility];  Service: Endoscopy;  Laterality: N/A;  . FOOT SURGERY    . herniated disc surgery  2008  . LAPAROSCOPIC ENDOMETRIOSIS FULGURATION    . ROBOTIC ASSISTED TOTAL HYSTERECTOMY Bilateral 11/01/2013   Procedure: ROBOTIC ASSISTED TOTAL HYSTERECTOMY WITH BILATERAL SALPINOGECTOMY;  Surgeon: Alwyn Pea, MD;  Location: [facility];  Service: Gynecology;  Laterality: Bilateral;  . TONSILLECTOMY  1993    There were no vitals filed for this visit.  Subjective Assessment - 05/23/18 1558    Subjective  Going to walk in clinic after this.  Sore throat, low grade fever. No back pain , worked a full day.     Currently in Pain?  No/denies             [facility] Adult PT Treatment/Exercise - 05/23/18 0001      Lumbar Exercises: Aerobic   Elliptical  5 min L1 and ramp 1 , modified for knee pain (wgt in heels )  Lumbar Exercises: Standing   Functional Squats  10 reps    Functional Squats Limitations  added 5 lbs reach over head  2 x 5 reps each side     Row  Strengthening;Both;15 reps    Theraband Level (Row)  Level 4 (Blue)    Shoulder Extension  Strengthening;Both;10 reps    Other Standing Lumbar Exercises  standing core: blue band Pallof press and twist x 10 each     Other Standing Lumbar Exercises  above ex on foam Airex       Lumbar Exercises: Supine   Bridge  10 reps 2 sets , parallel and sumo                   PT Long Term Goals - 05/23/18 1621      PT LONG TERM GOAL #1   Title  Pt will be score 37% or less on FOTO to demo functional improvement     Baseline  33%    Status  Achieved      PT LONG TERM GOAL #2   Title  Pt will be able to wake with pain <2/10 in AM most days of the week.     Status  Achieved      PT LONG TERM GOAL #3    Title  Pt will be I with HEP for core, back/trunk, posture     Baseline  I thus far     Status  On-going      PT LONG TERM GOAL #4   Title  Pt will be able to lift laundry basket (or like object) without increased back pain     Status  Achieved      PT LONG TERM GOAL #5   Title  Pt will be able to walk as needed for recreation, fitness without increase in lasting pain.     Status  Achieved            Plan - 05/23/18 1621    Clinical Impression Statement  Patient is having much less leg pain.  She had no pain after working a full day today.  She scored 33%   on FOTO.  She has trouble laying on her back the entire session so we limited her time in supine to only 1 exercise.  She was timid about doing standing exercises and elliptical so she was pleased she could do it without pain.     PT Treatment/Interventions  ADLs/Self Care Home Management;Electrical Stimulation;Cryotherapy;Moist Heat;Ultrasound;Therapeutic exercise;Therapeutic activities;Taping;Manual techniques;Passive range of motion    PT Next Visit Plan  what did MD say? cont vs DC.   Progress exercise as able  stabilization    PT Home Exercise Plan  hamstring, piriformis, Tr A iso and PPT Quadratus lumborum stretches, bent knee fall out, bridging and reverse march (scissor) TrA series from ex drawer    Consulted and Agree with Plan of Care  Patient       Patient will benefit from skilled therapeutic intervention in order to improve the following deficits and impairments:  Decreased mobility, Difficulty walking, Hypomobility, Increased muscle spasms, Impaired sensation, Decreased range of motion, Increased fascial restricitons, Decreased strength, Impaired flexibility, Pain  Visit Diagnosis: Chronic bilateral low back pain with sciatica, sciatica laterality unspecified  Radiculopathy, lumbar region  Difficulty in walking, not elsewhere classified     Problem List Patient Active Problem List   Diagnosis Date Noted  .  HTN (hypertension) 02/21/2014  . SVT (supraventricular tachycardia) ([place]) 02/21/2014  .  Postpartum hypertension   . Pelvic pain 11/01/2013  . DEPRESSION 05/30/2008  . Mitral valve disorder 05/30/2008  . ANAL FISSURE 05/30/2008  . BICUSPID AORTIC VALVE 05/30/2008  . DYSPHAGIA UNSPECIFIED 05/30/2008  . MIGRAINE HEADACHE 05/29/2008  . GERD 05/29/2008  . CONSTIPATION 05/29/2008  . IBS 05/29/2008  . EPIGASTRIC PAIN 05/29/2008  . Hx of endometriosis 12/24/2003    PAA,JENNIFER 05/23/2018, 4:31 PM  [facility] [address] [place], [place], [address] Phone: [phone]   Fax:  [phone]  Name: Madeline Everett MRN: [id number] Date of Birth: 09-Jun-1976   Raeford Razor, PT 05/23/18 4:31 PM Phone: [phone] Fax: [phone]

## 2018-05-24 DIAGNOSIS — L821 Other seborrheic keratosis: Secondary | ICD-10-CM | POA: Diagnosis not present

## 2018-05-24 DIAGNOSIS — L700 Acne vulgaris: Secondary | ICD-10-CM | POA: Diagnosis not present

## 2018-05-24 DIAGNOSIS — Z1283 Encounter for screening for malignant neoplasm of skin: Secondary | ICD-10-CM | POA: Diagnosis not present

## 2018-05-25 ENCOUNTER — Ambulatory Visit (INDEPENDENT_AMBULATORY_CARE_PROVIDER_SITE_OTHER): Payer: 59 | Admitting: Specialist

## 2018-05-25 ENCOUNTER — Encounter (INDEPENDENT_AMBULATORY_CARE_PROVIDER_SITE_OTHER): Payer: Self-pay | Admitting: Specialist

## 2018-05-25 VITALS — BP 120/82 | HR 76 | Ht 64.0 in | Wt 152.0 lb

## 2018-05-25 DIAGNOSIS — M94261 Chondromalacia, right knee: Secondary | ICD-10-CM

## 2018-05-25 DIAGNOSIS — M5136 Other intervertebral disc degeneration, lumbar region: Secondary | ICD-10-CM

## 2018-05-25 NOTE — Progress Notes (Signed)
Office Visit Note   Patient: Madeline Everett           Date of Birth: 1976/11/25           MRN: [id number] Visit Date: 05/25/2018              Requested by: Harlan Stains, MD [place], [place] [address] PCP: Harlan Stains, MD   Assessment & Plan: Visit Diagnoses:  1. Degenerative disc disease, lumbar   2. Chondromalacia of knee, right     Plan:  Avoid frequent bending and stooping  No lifting greater than 10 lbs. May use ice or moist heat for pain. Weight loss is of benefit Knee is suffering from osteoarthritis, only real proven treatments are Weight loss, NSIADs like motrin, alleve, and exercise. Well padded shoes help. Ice the knee 2-3 times a day 15-20 mins at a time. Follow-Up Instructions: Return if symptoms worsen or fail to improve.   Orders:  No orders of the defined types were placed in this encounter.  No orders of the defined types were placed in this encounter.     Procedures: No procedures performed   Clinical Data: No additional findings.   Subjective: Chief Complaint  Patient presents with  . Lower Back - Follow-up    Avoid frequent bending and stooping  No lifting greater than 10 lbs. May use ice or moist heat for pain. Weight loss is of benefit.     Review of Systems  Constitutional: Negative.   HENT: Negative.   Eyes: Negative.   Respiratory: Negative.   Cardiovascular: Negative.   Gastrointestinal: Negative.   Endocrine: Negative.   Genitourinary: Negative.   Musculoskeletal: Negative.   Skin: Negative.   Allergic/Immunologic: Negative.   Neurological: Negative.   Hematological: Negative.   Psychiatric/Behavioral: Negative.      Objective: Vital Signs: BP 120/82 (BP Location: Left Arm, Patient Position: Sitting)   Pulse 76   Ht 5\' 4"  (1.626 m)   Wt 152 lb (68.9 kg)   LMP 02/13/2012   BMI 26.09 kg/m   Physical Exam  Constitutional: She is oriented to person, place, and time. She  appears well-developed and well-nourished.  HENT:  Head: Normocephalic and atraumatic.  Eyes: Pupils are equal, round, and reactive to light. EOM are normal.  Neck: Normal range of motion. Neck supple.  Pulmonary/Chest: Effort normal and breath sounds normal.  Abdominal: Soft. Bowel sounds are normal.  Musculoskeletal: Normal range of motion.  Neurological: She is alert and oriented to person, place, and time.  Skin: Skin is warm and dry.  Psychiatric: She has a normal mood and affect. Her behavior is normal. Judgment and thought content normal.    Right Knee Exam   Muscle Strength  The patient has normal right knee strength.  Tenderness  The patient is experiencing tenderness in the patella.  Range of Motion  Extension:  0 normal  Flexion:  130 normal   Tests  McMurray:  Medial - negative Lateral - negative Varus: negative Valgus: negative Lachman:  Anterior - negative    Posterior - negative Drawer:  Anterior - negative    Posterior - negative Pivot shift: negative Patellar apprehension: negative  Other  Erythema: absent Scars: present Sensation: normal Pulse: present Swelling: mild  Comments:  Grating right knee.   Left Knee Exam  Left knee exam is normal.      Specialty Comments:  No specialty comments available.  Imaging: No results found.   PMFS History:  Patient Active Problem List   Diagnosis Date Noted  . HTN (hypertension) 02/21/2014  . SVT (supraventricular tachycardia) ([place]) 02/21/2014  . Postpartum hypertension   . Pelvic pain 11/01/2013  . DEPRESSION 05/30/2008  . Mitral valve disorder 05/30/2008  . ANAL FISSURE 05/30/2008  . BICUSPID AORTIC VALVE 05/30/2008  . DYSPHAGIA UNSPECIFIED 05/30/2008  . MIGRAINE HEADACHE 05/29/2008  . GERD 05/29/2008  . CONSTIPATION 05/29/2008  . IBS 05/29/2008  . EPIGASTRIC PAIN 05/29/2008  . Hx of endometriosis 12/24/2003   Past Medical History:  Diagnosis Date  . Acid reflux   . Aortic valve  disorder    Bicuspid aortic valve  . Atypical chest pain   . Complication of anesthesia    nausea & vomiting and difficult time waking up  . Compound nevus 01/2012   dysplastic, melanocytic atypia  . Depression    crying spells for a year due to stress of  infertility  . Deviated septum    Dr Ruby Cola  . Endometriosis   . FH: thyroid condition   . Gestational hypertension   . H/O candidiasis   . H/O constipation   . H/O dyspareunia   . H/O varicella    childhood  . Headache(784.0)    migraine topomax  . Heart murmur    congential bicuspid aortic valve, mitral valve prolapse  . Herniated disc 2008  . HTN (hypertension)   . IBS (irritable bowel syndrome)   . Infertility, female   . Pelvic pain   . PONV (postoperative nausea and vomiting)    needs Scop patch  . Postpartum hypertension   . SVT (supraventricular tachycardia) ([place])   . UTI (lower urinary tract infection)   . Yeast infection     Family History  Problem Relation Age of Onset  . Diabetes Mother   . Hypertension Father   . Diabetes Father   . Heart disease Maternal Uncle   . Hypertension Maternal Grandmother   . Diabetes Maternal Grandmother   . Heart disease Maternal Grandmother     Past Surgical History:  Procedure Laterality Date  . ABDOMINAL HYSTERECTOMY    . BUNIONECTOMY  1998  . CESAREAN SECTION  2010  . COLONOSCOPY WITH PROPOFOL N/A 08/20/2015   Procedure: COLONOSCOPY WITH PROPOFOL;  Surgeon: Arta Silence, MD;  [facility];  Service: Endoscopy;  Laterality: N/A;  . dermoid cyst  removed   2010  . DILATION AND CURETTAGE OF UTERUS  12/31/10  . ENTEROSCOPY N/A 08/20/2015   Procedure: ENTEROSCOPY;  Surgeon: Arta Silence, MD;  [facility];  Service: Endoscopy;  Laterality: N/A;  . FOOT SURGERY    . herniated disc surgery  2008  . LAPAROSCOPIC ENDOMETRIOSIS FULGURATION    . ROBOTIC ASSISTED TOTAL HYSTERECTOMY Bilateral 11/01/2013   Procedure: ROBOTIC ASSISTED TOTAL  HYSTERECTOMY WITH BILATERAL SALPINOGECTOMY;  Surgeon: Alwyn Pea, MD;  Location: [facility];  Service: Gynecology;  Laterality: Bilateral;  . TONSILLECTOMY  1993   Social History   Occupational History  . Not on file  Tobacco Use  . Smoking status: Never Smoker  . Smokeless tobacco: Never Used  Substance and Sexual Activity  . Alcohol use: No  . Drug use: No  . Sexual activity: Yes    Birth control/protection: Pill

## 2018-05-25 NOTE — Patient Instructions (Addendum)
Avoid frequent bending and stooping  No lifting greater than 10 lbs. May use ice or moist heat for pain. Weight loss is of benefit Continue with Pt and home exercises. Exercises for the right knee, use the [place] knee strap.  Knee is suffering from osteoarthritis, only real proven treatments are Weight loss, NSIADs like motrin, alleve, and exercise. Well padded shoes help. Ice the knee 2-3 times a day 15-20 mins at a time.

## 2018-05-26 ENCOUNTER — Encounter: Payer: Self-pay | Admitting: Physical Therapy

## 2018-05-26 ENCOUNTER — Ambulatory Visit: Payer: 59 | Admitting: Physical Therapy

## 2018-05-26 DIAGNOSIS — G8929 Other chronic pain: Secondary | ICD-10-CM

## 2018-05-26 DIAGNOSIS — M5441 Lumbago with sciatica, right side: Secondary | ICD-10-CM

## 2018-05-26 DIAGNOSIS — M5416 Radiculopathy, lumbar region: Secondary | ICD-10-CM

## 2018-05-26 DIAGNOSIS — M5440 Lumbago with sciatica, unspecified side: Principal | ICD-10-CM

## 2018-05-26 DIAGNOSIS — R262 Difficulty in walking, not elsewhere classified: Secondary | ICD-10-CM

## 2018-05-26 NOTE — Therapy (Signed)
[place], [place], [address] Phone: [phone]   Fax:  [phone]  Physical Therapy Treatment/Discharge  Patient Details  Name: Madeline Everett MRN: [id number] Date of Birth: July 20, 1976 Referring Provider: Dr. Louanne Skye   Encounter Date: 05/26/2018  PT End of Session - 05/26/18 1218    Visit Number  8    Number of Visits  12    Date for PT Re-Evaluation  06/06/18    PT Start Time  1150    PT Stop Time  1220    PT Time Calculation (min)  30 min    Activity Tolerance  Patient tolerated treatment well    Behavior During Therapy  [facility] for tasks assessed/performed       Past Medical History:  Diagnosis Date  . Acid reflux   . Aortic valve disorder    Bicuspid aortic valve  . Atypical chest pain   . Complication of anesthesia    nausea & vomiting and difficult time waking up  . Compound nevus 01/2012   dysplastic, melanocytic atypia  . Depression    crying spells for a year due to stress of  infertility  . Deviated septum    Dr Ruby Cola  . Endometriosis   . FH: thyroid condition   . Gestational hypertension   . H/O candidiasis   . H/O constipation   . H/O dyspareunia   . H/O varicella    childhood  . Headache(784.0)    migraine topomax  . Heart murmur    congential bicuspid aortic valve, mitral valve prolapse  . Herniated disc 2008  . HTN (hypertension)   . IBS (irritable bowel syndrome)   . Infertility, female   . Pelvic pain   . PONV (postoperative nausea and vomiting)    needs Scop patch  . Postpartum hypertension   . SVT (supraventricular tachycardia) ([place])   . UTI (lower urinary tract infection)   . Yeast infection     Past Surgical History:  Procedure Laterality Date  . ABDOMINAL HYSTERECTOMY    . BUNIONECTOMY  1998  . CESAREAN SECTION  2010  . COLONOSCOPY WITH PROPOFOL N/A 08/20/2015   Procedure: COLONOSCOPY WITH PROPOFOL;  Surgeon: Arta Silence, MD;  Location: [facility];  Service: Endoscopy;  Laterality: N/A;  . dermoid cyst  removed   2010  . DILATION AND CURETTAGE OF UTERUS  12/31/10  . ENTEROSCOPY N/A 08/20/2015   Procedure: ENTEROSCOPY;  Surgeon: Arta Silence, MD;  Location: [facility];  Service: Endoscopy;  Laterality: N/A;  . FOOT SURGERY    . herniated disc surgery  2008  . LAPAROSCOPIC ENDOMETRIOSIS FULGURATION    . ROBOTIC ASSISTED TOTAL HYSTERECTOMY Bilateral 11/01/2013   Procedure: ROBOTIC ASSISTED TOTAL HYSTERECTOMY WITH BILATERAL SALPINOGECTOMY;  Surgeon: Alwyn Pea, MD;  Location: [facility];  Service: Gynecology;  Laterality: Bilateral;  . TONSILLECTOMY  1993    There were no vitals filed for this visit.  Subjective Assessment - 05/26/18 1153    Subjective  No pain at all this week really.       Currently in Pain?  No/denies         [facility] PT Assessment - 05/26/18 0001      Observation/Other Assessments   Focus on Therapeutic Outcomes (FOTO)   33%      Sensation   Additional Comments  occ numbness in big toe and dorsum of L foot       Strength   Right Hip Flexion  5/5    Right Hip ABduction  5/5    Left Hip Flexion  5/5    Left Hip ABduction  4+/5    Right Knee Flexion  5/5    Right Knee Extension  5/5    Left Knee Flexion  4+/5    Left Knee Extension  5/5    Right Ankle Dorsiflexion  5/5    Left Ankle Dorsiflexion  5/5              [facility] Adult PT Treatment/Exercise - 05/26/18 0001      Self-Care   Other Self-Care Comments   HEP verbal and demo       Lumbar Exercises: Stretches   Active Hamstring Stretch  2 reps;60 seconds    ITB Stretch  2 reps;30 seconds    Figure 4 Stretch  3 reps;30 seconds      Lumbar Exercises: Aerobic   Stationary Bike  6 min L2 for warm upn      Lumbar Exercises: Standing   Other Standing Lumbar Exercises  standing core: blue band Pallof press and twist x 10 each                   PT Long Term Goals - 05/26/18 1224      PT LONG TERM GOAL #1   Title  Pt  will be score 37% or less on FOTO to demo functional improvement     Status  Achieved      PT LONG TERM GOAL #2   Title  Pt will be able to wake with pain <2/10 in AM most days of the week.     Status  Achieved      PT LONG TERM GOAL #3   Title  Pt will be I with HEP for core, back/trunk, posture     Status  Achieved      PT LONG TERM GOAL #4   Title  Pt will be able to lift laundry basket (or like object) without increased back pain     Status  Achieved      PT LONG TERM GOAL #5   Title  Pt will be able to walk as needed for recreation, fitness without increase in lasting pain.     Status  Achieved            Plan - 05/26/18 1224    Clinical Impression Statement  Patient has met all LTGs.  She saw MD and he was pleased.  She feels she can manage on her own.  Surpassed FOTO goal.     PT Next Visit Plan  DC    PT Home Exercise Plan  hamstring, piriformis, Tr A iso and PPT Quadratus lumborum stretches, bent knee fall out, bridging and reverse march (scissor) TrA series from ex drawer    Consulted and Agree with Plan of Care  Patient       Patient will benefit from skilled therapeutic intervention in order to improve the following deficits and impairments:  Decreased mobility, Difficulty walking, Hypomobility, Increased muscle spasms, Impaired sensation, Decreased range of motion, Increased fascial restricitons, Decreased strength, Impaired flexibility, Pain  Visit Diagnosis: Chronic bilateral low back pain with sciatica, sciatica laterality unspecified  Radiculopathy, lumbar region  Difficulty in walking, not elsewhere classified     Problem List Patient Active Problem List   Diagnosis Date Noted  . HTN (hypertension) 02/21/2014  . SVT (supraventricular tachycardia) ([place]) 02/21/2014  . Postpartum hypertension   . Pelvic pain  11/01/2013  . DEPRESSION 05/30/2008  . Mitral valve disorder 05/30/2008  . ANAL FISSURE 05/30/2008  . BICUSPID AORTIC VALVE 05/30/2008  .  DYSPHAGIA UNSPECIFIED 05/30/2008  . MIGRAINE HEADACHE 05/29/2008  . GERD 05/29/2008  . CONSTIPATION 05/29/2008  . IBS 05/29/2008  . EPIGASTRIC PAIN 05/29/2008  . Hx of endometriosis 12/24/2003    PAA,JENNIFER 05/26/2018, 12:26 PM  [facility] [address] [place], [place], [address] Phone: [phone]   Fax:  [phone]  Name: DANYLAH HOLDEN MRN: [id number] Date of Birth: 03/26/1976  PHYSICAL THERAPY DISCHARGE SUMMARY  Visits from Start of Care: 8  Current functional level related to goals / functional outcomes: Not limited by pain    Remaining deficits: None   Education / Equipment: HEP, core  Plan: Patient agrees to discharge.  Patient goals were met. Patient is being discharged due to meeting the stated rehab goals.  ?????    Raeford Razor, PT 05/26/18 12:27 PM Phone: [phone] Fax: [phone]

## 2018-05-30 ENCOUNTER — Ambulatory Visit: Payer: 59 | Admitting: Physical Therapy

## 2018-06-02 ENCOUNTER — Ambulatory Visit: Payer: 59 | Admitting: Physical Therapy

## 2018-06-26 DIAGNOSIS — G43719 Chronic migraine without aura, intractable, without status migrainosus: Secondary | ICD-10-CM | POA: Diagnosis not present

## 2018-06-26 DIAGNOSIS — G43019 Migraine without aura, intractable, without status migrainosus: Secondary | ICD-10-CM | POA: Diagnosis not present

## 2018-06-26 DIAGNOSIS — G43111 Migraine with aura, intractable, with status migrainosus: Secondary | ICD-10-CM | POA: Diagnosis not present

## 2018-08-12 DIAGNOSIS — F411 Generalized anxiety disorder: Secondary | ICD-10-CM

## 2018-08-30 ENCOUNTER — Ambulatory Visit (INDEPENDENT_AMBULATORY_CARE_PROVIDER_SITE_OTHER): Payer: 59 | Admitting: Psychiatry

## 2018-08-30 DIAGNOSIS — F411 Generalized anxiety disorder: Secondary | ICD-10-CM

## 2018-08-30 MED ORDER — BUPROPION HCL ER (SR) 150 MG PO TB12: 150.0000 mg | ORAL_TABLET | Freq: Two times a day (BID) | ORAL | 1 refills | Status: DC

## 2018-08-30 NOTE — Progress Notes (Signed)
[facility]  Patient ID: KECIA SWOBODA,  MRN: [id number]  PCP: Harlan Stains, MD  Date of Evaluation: 08/30/2018 Time spent:20 minutes   HISTORY/CURRENT STATUS: HPI patient is a 42 year old white female last seen in April of this year.  Treating for anxiety.  She continues to do well.  Individual Medical History/ Review of Systems: Changes? :No  Allergies: Ciprofloxacin; Codeine; Fluoxetine; Guaifenesin; Guaifenesin & derivatives; Lactose; Lactose intolerance (gi); Lactulose; Oxycodone-acetaminophen; Paxil [paroxetine hcl]; Percocet [oxycodone-acetaminophen]; Prozac [fluoxetine hcl]; Amoxicillin; Hydrocodone-acetaminophen; Iodine; Penicillins; and Vicodin [hydrocodone-acetaminophen]  Current Medications:  Current Outpatient Medications:  .  albuterol (PROVENTIL HFA;VENTOLIN HFA) 108 (90 BASE) MCG/ACT inhaler, Inhale 1-2 puffs into the lungs every 6 (six) hours as needed for wheezing or shortness of breath., Disp: , Rfl:  .  buPROPion (WELLBUTRIN SR) 150 MG 12 hr tablet, Take 1 tablet (150 mg total) by mouth 2 (two) times daily., Disp: 180 tablet, Rfl: 1 .  CLINDAMYCIN-BENZOYL PER-CLEANS EX, Apply 1 application topically every morning. , Disp: , Rfl:  .  fluticasone (FLONASE) 50 MCG/ACT nasal spray, Place 2 sprays into both nostrils as needed for allergies or rhinitis. , Disp: , Rfl:  .  ibuprofen (ADVIL,MOTRIN) 200 MG tablet, Take 400 mg by mouth every 6 (six) hours as needed for mild pain., Disp: , Rfl:  .  Magnesium 250 MG TABS, Take 1 tablet by mouth every evening., Disp: , Rfl:  .  mometasone-formoterol (DULERA) 200-5 MCG/ACT AERO, Inhale 2 puffs into the lungs 2 (two) times daily as needed for wheezing., Disp: , Rfl:  .  pseudoephedrine (SUDAFED) 30 MG tablet, Take 30 mg by mouth every 6 (six) hours as needed for congestion., Disp: , Rfl:  .  ranitidine (ZANTAC) 150 MG tablet, Take 150 mg by mouth 2 (two) times daily as needed for heartburn. , Disp: , Rfl:  .   spironolactone (ALDACTONE) 25 MG tablet, Take 25 mg by mouth daily., Disp: , Rfl:  .  Topiramate ER 150 MG CS24, Take 150 mg by mouth at bedtime. , Disp: , Rfl:  .  methocarbamol (ROBAXIN) 500 MG tablet, Take 1 tablet (500 mg total) by mouth every 8 (eight) hours as needed for muscle spasms. (Patient not taking: Reported on 08/30/2018), Disp: 40 tablet, Rfl: 0 .  methylPREDNISolone (MEDROL) 4 MG tablet, 6 day taper to be taken as directed (Patient not taking: Reported on 08/30/2018), Disp: 21 tablet, Rfl: 0 .  traMADol (ULTRAM) 50 MG tablet, Take 1 tablet (50 mg total) by mouth every 8 (eight) hours as needed. (Patient not taking: Reported on 08/30/2018), Disp: 30 tablet, Rfl: 0 Medication Side Effects: None  Family Medical/ Social History: Changes? No  MENTAL HEALTH EXAM:  Last menstrual period 02/13/2012.There is no height or weight on file to calculate BMI.  General Appearance: Casual  Eye Contact:  Good  Speech:  Normal Rate  Volume:  Normal  Mood:  Euthymic  Affect:  Appropriate  Thought Process:  Goal Directed  Orientation:  Full (Time, Place, and Person)  Thought Content: Logical   Suicidal Thoughts:  No  Homicidal Thoughts:  No  Memory:  Immediate  Judgement:  Good  Insight:  Good  Psychomotor Activity:  Normal  Concentration:  Concentration: Good  Recall:  Good  Fund of Knowledge: Good  Language: Good  Akathisia:  NA  AIMS (if indicated): na  Assets:  Desire for Improvement  ADL's:  Intact  Cognition: WNL  Prognosis:  Good    DIAGNOSES:    ICD-10-CM  1. Generalized anxiety disorder F41.1 buPROPion (WELLBUTRIN SR) 150 MG 12 hr tablet    RECOMMENDATIONS: Continue current medications.  Return in 6 months    Comer Locket, [place]

## 2018-08-31 DIAGNOSIS — J0110 Acute frontal sinusitis, unspecified: Secondary | ICD-10-CM | POA: Diagnosis not present

## 2018-09-15 DIAGNOSIS — Z23 Encounter for immunization: Secondary | ICD-10-CM | POA: Diagnosis not present

## 2018-09-26 DIAGNOSIS — J0100 Acute maxillary sinusitis, unspecified: Secondary | ICD-10-CM | POA: Diagnosis not present

## 2018-09-26 DIAGNOSIS — J209 Acute bronchitis, unspecified: Secondary | ICD-10-CM | POA: Diagnosis not present

## 2018-09-30 DIAGNOSIS — J45909 Unspecified asthma, uncomplicated: Secondary | ICD-10-CM | POA: Diagnosis not present

## 2018-09-30 DIAGNOSIS — J069 Acute upper respiratory infection, unspecified: Secondary | ICD-10-CM | POA: Diagnosis not present

## 2018-09-30 DIAGNOSIS — J209 Acute bronchitis, unspecified: Secondary | ICD-10-CM | POA: Diagnosis not present

## 2018-10-03 ENCOUNTER — Other Ambulatory Visit: Payer: Self-pay | Admitting: Family Medicine

## 2018-10-03 ENCOUNTER — Ambulatory Visit
Admission: RE | Admit: 2018-10-03 | Discharge: 2018-10-03 | Disposition: A | Discharge status: Home / Self Care | Payer: 59 | Source: Ambulatory Visit | Attending: Family Medicine | Admitting: Family Medicine

## 2018-10-03 DIAGNOSIS — R1011 Right upper quadrant pain: Secondary | ICD-10-CM

## 2018-10-03 DIAGNOSIS — K8020 Calculus of gallbladder without cholecystitis without obstruction: Secondary | ICD-10-CM

## 2018-10-06 ENCOUNTER — Encounter: Payer: Self-pay | Admitting: Cardiology

## 2018-10-06 ENCOUNTER — Ambulatory Visit (INDEPENDENT_AMBULATORY_CARE_PROVIDER_SITE_OTHER): Payer: 59 | Admitting: Cardiology

## 2018-10-06 VITALS — BP 110/82 | HR 96 | Ht 64.0 in | Wt 147.4 lb

## 2018-10-06 DIAGNOSIS — Q231 Congenital insufficiency of aortic valve: Secondary | ICD-10-CM

## 2018-10-06 DIAGNOSIS — R1013 Epigastric pain: Secondary | ICD-10-CM

## 2018-10-06 DIAGNOSIS — I471 Supraventricular tachycardia: Secondary | ICD-10-CM

## 2018-10-06 DIAGNOSIS — I10 Essential (primary) hypertension: Secondary | ICD-10-CM | POA: Diagnosis not present

## 2018-10-06 LAB — BASIC METABOLIC PANEL
BUN / CREAT RATIO: 14 (ref 9–23)
BUN: 13 mg/dL (ref 6–24)
CO2: 19 mmol/L — AB (ref 20–29)
CREATININE: 0.95 mg/dL (ref 0.57–1.00)
Calcium: 9.1 mg/dL (ref 8.7–10.2)
Chloride: 108 mmol/L — ABNORMAL HIGH (ref 96–106)
GFR, EST AFRICAN AMERICAN: 86 mL/min/{1.73_m2} (ref >59)
GFR, EST NON AFRICAN AMERICAN: 75 mL/min/{1.73_m2} (ref >59)
Glucose: 86 mg/dL (ref 65–99)
POTASSIUM: 4.7 mmol/L (ref 3.5–5.2)
SODIUM: 141 mmol/L (ref 134–144)

## 2018-10-06 NOTE — Patient Instructions (Addendum)
Medication Instructions:  Your physician recommends that you continue on your current medications as directed. Please refer to the Current Medication list given to you today.  If you need a refill on your cardiac medications before your next appointment, please call your pharmacy.   Lab work: TODAY:  BMET  If you have labs (blood work) drawn today and your tests are completely normal, you will receive your results only by: Marland Kitchen MyChart Message (if you have MyChart) OR . A paper copy in the mail If you have any lab test that is abnormal or we need to change your treatment, we will call you to review the results.  Testing/Procedures: None ordered  Follow-Up: At [facility], you and your health needs are our priority.  As part of our continuing mission to provide you with exceptional heart care, we have created designated Provider Care Teams.  These Care Teams include your primary Cardiologist (physician) and Advanced Practice Providers (APPs -  Physician Assistants and Nurse Practitioners) who all work together to provide you with the care you need, when you need it. You will need a follow up appointment in 1 years.  Please call our office 2 months in advance to schedule this appointment.  You may see Df. Turner or one of the following Advanced Practice Providers on your designated Care Team:   [place], PA-C Dayna Dunn, PA-C . Ermalinda Barrios, PA-C  Any Other Special Instructions Will Be Listed Below (If Applicable).

## 2018-10-06 NOTE — Progress Notes (Signed)
Cardiology Office Note:    Date:  10/06/2018   ID:  Madeline Everett, DOB 06-Jan-1976, MRN [id number]  PCP:  Harlan Stains, MD  Cardiologist:  Fransico Him, MD    Referring MD: Harlan Stains, MD   Chief Complaint  Patient presents with  . Follow-up    Bicuspid aortic valve, SVT and hypertension    History of Present Illness:    Madeline Everett is a 42 y.o. female with a hx of HTN, SVT and bicuspid AV.  She is here today for followup and is doing well from a cardiac standpoint..  She denies any chest pain or pressure, SOB, DOE, PND, orthopnea, LE edema, dizziness, palpitations or syncope.  Unfortunately she is been having a lot of problems with her stomach.  She has been having increased bloating and abdominal and epigastric discomfort radiating into her right shoulder blade that occurs after she eats.  Is gotten to the point where she cannot tolerate eating more than soup.  Her pain is completely reproducible with palpation.  She has not had any chest pain or discomfort.  She had a gallbladder ultrasound he was her gallbladder but that was normal.  She is compliant with her meds and is tolerating meds with no SE.    Past Medical History:  Diagnosis Date  . Acid reflux   . Aortic valve disorder    Bicuspid aortic valve  . Atypical chest pain   . Complication of anesthesia    nausea & vomiting and difficult time waking up  . Compound nevus 01/2012   dysplastic, melanocytic atypia  . Depression    crying spells for a year due to stress of  infertility  . Deviated septum    Dr Ruby Cola  . Endometriosis   . FH: thyroid condition   . Gestational hypertension   . H/O candidiasis   . H/O constipation   . H/O dyspareunia   . H/O varicella    childhood  . Headache(784.0)    migraine topomax  . Heart murmur    congential bicuspid aortic valve, mitral valve prolapse  . Herniated disc 2008  . HTN (hypertension)   . IBS (irritable bowel syndrome)   . Infertility, female   .  Pelvic pain   . PONV (postoperative nausea and vomiting)    needs Scop patch  . Postpartum hypertension   . SVT (supraventricular tachycardia) ([place])   . UTI (lower urinary tract infection)   . Yeast infection     Past Surgical History:  Procedure Laterality Date  . ABDOMINAL HYSTERECTOMY    . BUNIONECTOMY  1998  . CESAREAN SECTION  2010  . COLONOSCOPY WITH PROPOFOL N/A 08/20/2015   Procedure: COLONOSCOPY WITH PROPOFOL;  Surgeon: Arta Silence, MD;  Location: [facility];  Service: Endoscopy;  Laterality: N/A;  . dermoid cyst  removed   2010  . DILATION AND CURETTAGE OF UTERUS  12/31/10  . ENTEROSCOPY N/A 08/20/2015   Procedure: ENTEROSCOPY;  Surgeon: Arta Silence, MD;  Location: [facility];  Service: Endoscopy;  Laterality: N/A;  . FOOT SURGERY    . herniated disc surgery  2008  . LAPAROSCOPIC ENDOMETRIOSIS FULGURATION    . ROBOTIC ASSISTED TOTAL HYSTERECTOMY Bilateral 11/01/2013   Procedure: ROBOTIC ASSISTED TOTAL HYSTERECTOMY WITH BILATERAL SALPINOGECTOMY;  Surgeon: Alwyn Pea, MD;  Location: [facility];  Service: Gynecology;  Laterality: Bilateral;  . TONSILLECTOMY  1993    Current Medications: Current Meds  Medication Sig  . albuterol (PROVENTIL HFA;VENTOLIN HFA) 108 (90  BASE) MCG/ACT inhaler Inhale 1-2 puffs into the lungs every 6 (six) hours as needed for wheezing or shortness of breath.  Marland Kitchen BREO ELLIPTA 100-25 MCG/INH AEPB Inhale 1 puff into the lungs daily.   Marland Kitchen buPROPion (WELLBUTRIN SR) 150 MG 12 hr tablet Take 1 tablet (150 mg total) by mouth 2 (two) times daily.  . cefUROXime (CEFTIN) 500 MG tablet Take 500 mg by mouth 2 (two) times daily with a meal.  . CLINDAMYCIN-BENZOYL PER-CLEANS EX Apply 1 application topically every morning.   . fluticasone (FLONASE) 50 MCG/ACT nasal spray Place 2 sprays into both nostrils as needed for allergies or rhinitis.   Marland Kitchen ibuprofen (ADVIL,MOTRIN) 200 MG tablet Take 400 mg by mouth every 6 (six) hours as needed for mild pain.  .  Magnesium 250 MG TABS Take 1 tablet by mouth every evening.  . methocarbamol (ROBAXIN) 500 MG tablet Take 1 tablet (500 mg total) by mouth every 8 (eight) hours as needed for muscle spasms.  . ondansetron (ZOFRAN-ODT) 8 MG disintegrating tablet Take 8 mg by mouth every 8 (eight) hours as needed for nausea or vomiting.  . pseudoephedrine (SUDAFED) 30 MG tablet Take 30 mg by mouth every 6 (six) hours as needed for congestion.  . ranitidine (ZANTAC) 150 MG tablet Take 150 mg by mouth 2 (two) times daily as needed for heartburn.   . spironolactone (ALDACTONE) 25 MG tablet Take 25 mg by mouth daily.  . Topiramate ER 150 MG CS24 Take 150 mg by mouth at bedtime.      Allergies:   Ciprofloxacin; Codeine; Fluoxetine; Guaifenesin; Guaifenesin & derivatives; Lactose; Lactose intolerance (gi); Lactulose; Oxycodone-acetaminophen; Paxil [paroxetine hcl]; Percocet [oxycodone-acetaminophen]; Prozac [fluoxetine hcl]; Amoxicillin; Hydrocodone-acetaminophen; Iodine; Penicillins; and Vicodin [hydrocodone-acetaminophen]   Social History   Socioeconomic History  . Marital status: Married    Spouse name: Not on file  . Number of children: Not on file  . Years of education: Not on file  . Highest education level: Not on file  Occupational History  . Not on file  Social Needs  . Financial resource strain: Not on file  . Food insecurity:    Worry: Not on file    Inability: Not on file  . Transportation needs:    Medical: Not on file    Non-medical: Not on file  Tobacco Use  . Smoking status: Never Smoker  . Smokeless tobacco: Never Used  Substance and Sexual Activity  . Alcohol use: No  . Drug use: No  . Sexual activity: Yes    Birth control/protection: Pill  Lifestyle  . Physical activity:    Days per week: Not on file    Minutes per session: Not on file  . Stress: Not on file  Relationships  . Social connections:    Talks on phone: Not on file    Gets together: Not on file    Attends religious  service: Not on file    Active member of club or organization: Not on file    Attends meetings of clubs or organizations: Not on file    Relationship status: Not on file  Other Topics Concern  . Not on file  Social History Narrative  . Not on file     Family History: The patient's family history includes Diabetes in her father, maternal grandmother, and mother; Heart disease in her maternal grandmother and maternal uncle; Hypertension in her father and maternal grandmother.  ROS:   Please see the history of present illness.    ROS  All other systems reviewed and negative.   EKGs/Labs/Other Studies Reviewed:    The following studies were reviewed today: none  EKG:  EKG is ordered today showed NSR at 96bpm with no ST changes  Recent Labs: No results found for requested labs within last 8760 hours.   Recent Lipid Panel No results found for: CHOL, TRIG, HDL, CHOLHDL, VLDL, LDLCALC, LDLDIRECT  Physical Exam:    VS:  BP 110/82   Pulse 96   Ht 5\' 4"  (1.626 m)   Wt 147 lb 6.4 oz (66.9 kg)   LMP 02/13/2012   BMI 25.30 kg/m     Wt Readings from Last 3 Encounters:  10/06/18 147 lb 6.4 oz (66.9 kg)  05/25/18 152 lb (68.9 kg)  04/13/18 152 lb (68.9 kg)     GEN:  Well nourished, well developed in no acute distress HEENT: Normal NECK: No JVD; No carotid bruits LYMPHATICS: No lymphadenopathy CARDIAC: RRR, no murmurs, rubs, gallops RESPIRATORY:  Clear to auscultation without rales, wheezing or rhonchi  ABDOMEN: Soft, non-tender, non-distended MUSCULOSKELETAL:  No edema; No deformity  SKIN: Warm and dry NEUROLOGIC:  Alert and oriented x 3 PSYCHIATRIC:  Normal affect   ASSESSMENT:    1. BICUSPID AORTIC VALVE   2. Essential hypertension   3. SVT (supraventricular tachycardia) ([place])   4. EPIGASTRIC PAIN    PLAN:    In order of problems listed above:  1.  Bicuspid AV -2D echo 08/05/2017 showed bicuspid aortic valve with no evidence of thickening.  2.  HTN -BP is well  controlled on exam today.  She will continue on Spironolactone 25 mg daily.  I will check a bemet today.  3.  SVT -she has not had any recurrence of palpitations.  4.  Abdominal pain -do not think this is cardiac in etiology.  Her EKG is nonischemic.  She has no exertional chest pain or pressure.  Her pain in her abdomen and epigastric area is completely reproducible with palpation.  Her discomfort is worse with eating.  She is in the process of having a GI evaluation.   Medication Adjustments/Labs and Tests Ordered: Current medicines are reviewed at length with the patient today.  Concerns regarding medicines are outlined above.  Orders Placed This Encounter  Procedures  . Basic metabolic panel  . EKG 12-Lead   No orders of the defined types were placed in this encounter.   Signed, Fransico Him, MD  10/06/2018 9:10 AM    [place]

## 2018-10-09 ENCOUNTER — Telehealth: Payer: Self-pay | Admitting: *Deleted

## 2018-10-09 ENCOUNTER — Other Ambulatory Visit (HOSPITAL_COMMUNITY): Payer: Self-pay | Admitting: Physician Assistant

## 2018-10-09 DIAGNOSIS — R1011 Right upper quadrant pain: Secondary | ICD-10-CM

## 2018-10-09 DIAGNOSIS — K219 Gastro-esophageal reflux disease without esophagitis: Secondary | ICD-10-CM | POA: Diagnosis not present

## 2018-10-09 DIAGNOSIS — R1013 Epigastric pain: Secondary | ICD-10-CM | POA: Diagnosis not present

## 2018-10-09 NOTE — Telephone Encounter (Signed)
Left message to go over lab results.  

## 2018-10-09 NOTE — Telephone Encounter (Signed)
-----   Message from Nuala Alpha, LPN sent at [id number]/73/2202  8:21 AM EST -----   ----- Message ----- From: Sueanne Margarita, MD Sent: 10/08/2018   5:59 PM EST To: Harlan Stains, MD, [place] Triage  Please let patient know that labs were normal.  Continue current medical therapy.

## 2018-10-19 ENCOUNTER — Encounter (HOSPITAL_COMMUNITY)
Admission: RE | Admit: 2018-10-19 | Discharge: 2018-10-19 | Disposition: A | Discharge status: Home / Self Care | Payer: 59 | Source: Ambulatory Visit | Attending: Physician Assistant | Admitting: Physician Assistant

## 2018-10-19 DIAGNOSIS — R1011 Right upper quadrant pain: Secondary | ICD-10-CM | POA: Diagnosis present

## 2018-10-19 DIAGNOSIS — R1010 Upper abdominal pain, unspecified: Secondary | ICD-10-CM | POA: Diagnosis not present

## 2018-10-19 DIAGNOSIS — R112 Nausea with vomiting, unspecified: Secondary | ICD-10-CM | POA: Diagnosis not present

## 2018-10-19 MED ORDER — TECHNETIUM TC 99M MEBROFENIN IV KIT
5.0000 | PACK | Freq: Once | INTRAVENOUS | Status: AC | PRN
  Administered 2018-10-19: 5 via INTRAVENOUS

## 2018-10-25 DIAGNOSIS — K581 Irritable bowel syndrome with constipation: Secondary | ICD-10-CM | POA: Diagnosis not present

## 2018-10-25 DIAGNOSIS — R109 Unspecified abdominal pain: Secondary | ICD-10-CM | POA: Diagnosis not present

## 2018-11-06 DIAGNOSIS — R109 Unspecified abdominal pain: Secondary | ICD-10-CM | POA: Diagnosis not present

## 2018-11-09 DIAGNOSIS — R1011 Right upper quadrant pain: Secondary | ICD-10-CM | POA: Diagnosis not present

## 2018-11-09 DIAGNOSIS — K2930 Chronic superficial gastritis without bleeding: Secondary | ICD-10-CM | POA: Diagnosis not present

## 2018-11-09 DIAGNOSIS — R1013 Epigastric pain: Secondary | ICD-10-CM | POA: Diagnosis not present

## 2018-12-18 DIAGNOSIS — I788 Other diseases of capillaries: Secondary | ICD-10-CM | POA: Diagnosis not present

## 2018-12-18 DIAGNOSIS — L821 Other seborrheic keratosis: Secondary | ICD-10-CM | POA: Diagnosis not present

## 2018-12-26 ENCOUNTER — Encounter: Payer: Self-pay | Admitting: Emergency Medicine

## 2018-12-26 DIAGNOSIS — F419 Anxiety disorder, unspecified: Secondary | ICD-10-CM | POA: Insufficient documentation

## 2019-02-02 DIAGNOSIS — Z6826 Body mass index (BMI) 26.0-26.9, adult: Secondary | ICD-10-CM | POA: Diagnosis not present

## 2019-02-02 DIAGNOSIS — Z01419 Encounter for gynecological examination (general) (routine) without abnormal findings: Secondary | ICD-10-CM | POA: Diagnosis not present

## 2019-02-02 DIAGNOSIS — Z1231 Encounter for screening mammogram for malignant neoplasm of breast: Secondary | ICD-10-CM | POA: Diagnosis not present

## 2019-02-05 DIAGNOSIS — G43019 Migraine without aura, intractable, without status migrainosus: Secondary | ICD-10-CM | POA: Diagnosis not present

## 2019-02-05 DIAGNOSIS — G43111 Migraine with aura, intractable, with status migrainosus: Secondary | ICD-10-CM | POA: Diagnosis not present

## 2019-02-05 DIAGNOSIS — G43719 Chronic migraine without aura, intractable, without status migrainosus: Secondary | ICD-10-CM | POA: Diagnosis not present

## 2019-02-28 ENCOUNTER — Ambulatory Visit: Payer: 59 | Admitting: Psychiatry

## 2019-03-28 ENCOUNTER — Ambulatory Visit: Payer: 59 | Admitting: Psychiatry

## 2019-04-17 ENCOUNTER — Ambulatory Visit (INDEPENDENT_AMBULATORY_CARE_PROVIDER_SITE_OTHER): Payer: BLUE CROSS/BLUE SHIELD | Admitting: Psychiatry

## 2019-04-17 ENCOUNTER — Encounter: Payer: Self-pay | Admitting: Psychiatry

## 2019-04-17 ENCOUNTER — Other Ambulatory Visit: Payer: Self-pay

## 2019-04-17 DIAGNOSIS — F411 Generalized anxiety disorder: Secondary | ICD-10-CM | POA: Diagnosis not present

## 2019-04-17 MED ORDER — BUSPIRONE HCL 15 MG PO TABS: ORAL_TABLET | ORAL | 1 refills | Status: DC

## 2019-04-17 MED ORDER — BUPROPION HCL ER (SR) 150 MG PO TB12: 150.0000 mg | ORAL_TABLET | Freq: Two times a day (BID) | ORAL | 1 refills | Status: DC

## 2019-04-17 NOTE — Progress Notes (Signed)
Madeline Everett [id number] [birth year] 43 y.o.  Virtual Visit via Video Note  I connected with pt on 04/17/19 at  4:30 PM EDT by a video enabled telemedicine application and verified that I am speaking with the correct person using two identifiers.   I discussed the limitations of evaluation and management by telemedicine and the availability of in person appointments. The patient expressed understanding and agreed to proceed.  I discussed the assessment and treatment plan with the patient. The patient was provided an opportunity to ask questions and all were answered. The patient agreed with the plan and demonstrated an understanding of the instructions.   The patient was advised to call back or seek an in-person evaluation if the symptoms worsen or if the condition fails to improve as anticipated.  I provided 30 minutes of non-face-to-face time during this encounter.  The patient was located at home.  The provider was located at home.   Thayer Headings, PMHNP   Subjective:   Patient ID:  Madeline Everett is a 43 y.o. (DOB 1976-08-06) female.  Chief Complaint:  Chief Complaint  Patient presents with  . Anxiety  . Follow-up    h/o Depression    HPI Madeline Everett presents to the office today for follow-up of anxiety. She reports that she has had some challenges with trying to work from home and meet deadlines. She reports that she has had some irritation and attributes this to situational stressors. She reports anxiety almost daily. She reports occ physical s/s with anxiety. Notices muscle tension with anxiety and at times feels like she needs to leave the room. She had some worry a few weeks into the pandemic and then this subsided. She reports that she is easily irritated by noises and this has always been the case. Denies any recent panic attacks. Denies persistent depressed mood. Reports that she falls asleep without difficulty and has difficulty staying asleep. She reports that her  fitness tracker indicates that she is not getting deep sleep. She reports that she is averaging 6-7 hours of sleep a night. Reports weight loss with dietary changes due to gallbladder issues. She reports that her energy and motivation have been adequate. Denies any worsening in concentration. Denies SI.   She has two children, 87 years old and 65 years old. 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   Remeron- Nasal congestion Cymbalta- Effective but caused constipation.  Wellbutrin Zoloft- adverse effect. Irritability. Affective dulling. Sexual side effects.  Prozac- Effective and then started having HA's with it.   Review of Systems:  Review of Systems  Gastrointestinal:       Was told in January that she needs to cholecystectomy and this has been postponed due to the pandemic.   Musculoskeletal: Negative for gait problem.  Neurological: Negative for tremors.  Psychiatric/Behavioral:       Please refer to HPI    Medications: I have reviewed the patient's current medications.  Current Outpatient Medications  Medication Sig Dispense Refill  . acetaminophen (TYLENOL) 325 MG tablet Take 650 mg by mouth every 6 (six) hours as needed.    Marland Kitchen albuterol (PROVENTIL HFA;VENTOLIN HFA) 108 (90 BASE) MCG/ACT inhaler Inhale 1-2 puffs into the lungs every 6 (six) hours as needed for wheezing or shortness of breath.    Marland Kitchen b complex vitamins capsule Take 1 capsule by mouth daily.    Marland Kitchen BREO ELLIPTA 100-25 MCG/INH AEPB Inhale 1 puff into the lungs daily.     Marland Kitchen  buPROPion (WELLBUTRIN SR) 150 MG 12 hr tablet Take 1 tablet (150 mg total) by mouth 2 (two) times daily. 180 tablet 1  . Magnesium 250 MG TABS Take 1 tablet by mouth every evening.    Marland Kitchen omeprazole (PRILOSEC) 40 MG capsule Take 40 mg by mouth daily.    . ondansetron (ZOFRAN-ODT) 8 MG disintegrating tablet Take 8 mg by mouth every 8 (eight) hours as needed for nausea or vomiting.    . Probiotic Product (PROBIOTIC PO) Take by mouth.    .  pseudoephedrine (SUDAFED) 30 MG tablet Take 30 mg by mouth every 6 (six) hours as needed for congestion.    Marland Kitchen spironolactone (ALDACTONE) 25 MG tablet Take 25 mg by mouth daily.    . Topiramate ER 150 MG CS24 Take 150 mg by mouth at bedtime.     Marland Kitchen VITAMIN D PO Take by mouth.    . busPIRone (BUSPAR) 15 MG tablet Take 1/3 tablet p.o. twice daily for 1 week, then take 2/3 tablet p.o. twice daily for 1 week, then take 1 tablet p.o. twice daily 60 tablet 1  . CLINDAMYCIN-BENZOYL PER-CLEANS EX Apply 1 application topically every morning.     . fluticasone (FLONASE) 50 MCG/ACT nasal spray Place 2 sprays into both nostrils as needed for allergies or rhinitis.     Marland Kitchen ibuprofen (ADVIL,MOTRIN) 200 MG tablet Take 400 mg by mouth every 6 (six) hours as needed for mild pain.     No current facility-administered medications for this visit.     Medication Side Effects: None  Allergies:  Allergies  Allergen Reactions  . Ciprofloxacin Swelling  . Codeine Nausea And Vomiting  . Fluoxetine Other (See Comments)    Headaches  . Guaifenesin Other (See Comments)    GI  . Guaifenesin & Derivatives     GI  . Lactose   . Lactose Intolerance (Gi)     GI  . Lactulose Other (See Comments)    GI  . Oxycodone-Acetaminophen Nausea And Vomiting  . Paxil [Paroxetine Hcl] Other (See Comments)    Does not remember.  Does not remember.   Richardo Hanks [Oxycodone-Acetaminophen] Nausea And Vomiting  . Prozac [Fluoxetine Hcl]     Headaches  . Amoxicillin Rash  . Hydrocodone-Acetaminophen Nausea Only  . Iodine Rash  . Penicillins Rash    Has patient had a PCN reaction causing immediate rash, facial/tongue/throat swelling, SOB or lightheadedness with hypotension: No Has patient had a PCN reaction causing severe rash involving mucus membranes or skin necrosis: Yes  Has patient had a PCN reaction that required hospitalization No Has patient had a PCN reaction occurring within the last 10 years: Yes  If all of the above  answers are "NO", then may proceed with Cephalosporin use. Has patient had a PCN reaction causing immediate rash, facial/tongue/throat swelling, SOB or lightheadedness with hypotension: No Has patient had a PCN reaction causing severe rash involving mucus membranes or skin necrosis: Yes  Has patient had a PCN reaction that required hospitalization No Has patient had a PCN reaction occurring within the last 10 years: Yes  If all of the above answers are "NO", then may proceed with Cephalosporin use.   . Vicodin [Hydrocodone-Acetaminophen] Nausea Only    Past Medical History:  Diagnosis Date  . Acid reflux   . Aortic valve disorder    Bicuspid aortic valve  . Atypical chest pain   . Complication of anesthesia    nausea & vomiting and difficult time waking up  .  Compound nevus 01/2012   dysplastic, melanocytic atypia  . Depression    crying spells for a year due to stress of  infertility  . Deviated septum    Dr Ruby Cola  . Endometriosis   . FH: thyroid condition   . Gestational hypertension   . H/O candidiasis   . H/O constipation   . H/O dyspareunia   . H/O varicella    childhood  . Headache(784.0)    migraine topomax  . Heart murmur    congential bicuspid aortic valve, mitral valve prolapse  . Herniated disc 2008  . HTN (hypertension)   . IBS (irritable bowel syndrome)   . Infertility, female   . Pelvic pain   . PONV (postoperative nausea and vomiting)    needs Scop patch  . Postpartum hypertension   . SVT (supraventricular tachycardia) ([place])   . UTI (lower urinary tract infection)   . Yeast infection     Family History  Problem Relation Age of Onset  . Diabetes Mother   . Hypertension Father   . Diabetes Father   . Heart disease Maternal Uncle   . Hypertension Maternal Grandmother   . Diabetes Maternal Grandmother   . Heart disease Maternal Grandmother     Social History   Socioeconomic History  . Marital status: Married    Spouse name: Not on file   . Number of children: Not on file  . Years of education: Not on file  . Highest education level: Not on file  Occupational History  . Not on file  Social Needs  . Financial resource strain: Not on file  . Food insecurity:    Worry: Not on file    Inability: Not on file  . Transportation needs:    Medical: Not on file    Non-medical: Not on file  Tobacco Use  . Smoking status: Never Smoker  . Smokeless tobacco: Never Used  Substance and Sexual Activity  . Alcohol use: No  . Drug use: No  . Sexual activity: Yes    Birth control/protection: Pill  Lifestyle  . Physical activity:    Days per week: Not on file    Minutes per session: Not on file  . Stress: Not on file  Relationships  . Social connections:    Talks on phone: Not on file    Gets together: Not on file    Attends religious service: Not on file    Active member of club or organization: Not on file    Attends meetings of clubs or organizations: Not on file    Relationship status: Not on file  . Intimate partner violence:    Fear of current or ex partner: Not on file    Emotionally abused: Not on file    Physically abused: Not on file    Forced sexual activity: Not on file  Other Topics Concern  . Not on file  Social History Narrative  . Not on file    Past Medical History, Surgical history, Social history, and Family history were reviewed and updated as appropriate.   Please see review of systems for further details on the patient's review from today.   Objective:   Physical Exam:  LMP 02/13/2012   Physical Exam Neurological:     Mental Status: She is alert and oriented to person, place, and time.     Cranial Nerves: No dysarthria.  Psychiatric:        Attention and Perception: Attention normal.  Speech: Speech normal.        Behavior: Behavior is cooperative.        Thought Content: Thought content normal. Thought content is not paranoid or delusional. Thought content does not include  homicidal or suicidal ideation. Thought content does not include homicidal or suicidal plan.        Cognition and Memory: Cognition and memory normal.        Judgment: Judgment normal.     Comments: Mood presents as mildly anxious.      Lab Review:     Component Value Date/Time   NA 141 10/06/2018 0858   K 4.7 10/06/2018 0858   CL 108 (H) 10/06/2018 0858   CO2 19 (L) 10/06/2018 0858   GLUCOSE 86 10/06/2018 0858   GLUCOSE 89 08/04/2016 0944   BUN 13 10/06/2018 0858   CREATININE 0.95 10/06/2018 0858   CREATININE 0.85 08/04/2016 0944   CALCIUM 9.1 10/06/2018 0858   PROT 6.1 05/30/2008 1044   ALBUMIN 3.7 05/30/2008 1044   AST 16 05/30/2008 1044   ALT 14 05/30/2008 1044   ALKPHOS 40 05/30/2008 1044   BILITOT 0.6 05/30/2008 1044   GFRNONAA 75 10/06/2018 0858   GFRAA 86 10/06/2018 0858       Component Value Date/Time   WBC 10.4 10/19/2013 1155   RBC 4.78 10/19/2013 1155   HGB 13.6 10/19/2013 1155   HCT 40.0 10/19/2013 1155   PLT 273 10/19/2013 1155   MCV 83.7 10/19/2013 1155   MCH 28.5 10/19/2013 1155   MCHC 34.0 10/19/2013 1155   RDW 12.7 10/19/2013 1155   MONOABS 0.4 05/30/2008 1044   EOSABS 0.0 05/30/2008 1044   BASOSABS 0.0 05/30/2008 1044    No results found for: POCLITH, LITHIUM   No results found for: PHENYTOIN, PHENOBARB, VALPROATE, CBMZ   .res Assessment: Plan:   Discussed potential benefits, risks, and side effects of BuSpar.  Patient agrees to trial of BuSpar. Start BuSpar 15 mg 1/3 tablet twice daily for 1 week, then increase to 2/3 tablet twice daily for 1 week, then increase to 1 tablet twice daily for anxiety. Continue Wellbutrin SR 150 mg twice daily for depression.  Discussed taking second dose earlier to possibly minimize sleep disturbance. Patient to follow-up in 6 weeks or sooner if clinically indicated.   Generalized anxiety disorder - Plan: busPIRone (BUSPAR) 15 MG tablet, buPROPion (WELLBUTRIN SR) 150 MG 12 hr tablet  Please see After Visit  Summary for patient specific instructions.  No future appointments.  No orders of the defined types were placed in this encounter.     -------------------------------

## 2019-05-10 ENCOUNTER — Other Ambulatory Visit: Payer: Self-pay | Admitting: Psychiatry

## 2019-05-10 DIAGNOSIS — F411 Generalized anxiety disorder: Secondary | ICD-10-CM

## 2019-05-24 ENCOUNTER — Emergency Department (HOSPITAL_COMMUNITY)
Admission: EM | Admit: 2019-05-24 | Discharge: 2019-05-24 | Disposition: A | Discharge status: Home / Self Care | Payer: BC Managed Care – PPO | Attending: Emergency Medicine | Admitting: Emergency Medicine

## 2019-05-24 ENCOUNTER — Other Ambulatory Visit: Payer: Self-pay

## 2019-05-24 ENCOUNTER — Encounter (HOSPITAL_COMMUNITY): Payer: Self-pay | Admitting: Emergency Medicine

## 2019-05-24 ENCOUNTER — Emergency Department (HOSPITAL_COMMUNITY): Payer: BC Managed Care – PPO

## 2019-05-24 DIAGNOSIS — R1032 Left lower quadrant pain: Secondary | ICD-10-CM | POA: Diagnosis not present

## 2019-05-24 DIAGNOSIS — N83202 Unspecified ovarian cyst, left side: Secondary | ICD-10-CM | POA: Diagnosis not present

## 2019-05-24 DIAGNOSIS — Z79899 Other long term (current) drug therapy: Secondary | ICD-10-CM | POA: Insufficient documentation

## 2019-05-24 DIAGNOSIS — N839 Noninflammatory disorder of ovary, fallopian tube and broad ligament, unspecified: Secondary | ICD-10-CM | POA: Diagnosis not present

## 2019-05-24 DIAGNOSIS — I10 Essential (primary) hypertension: Secondary | ICD-10-CM | POA: Insufficient documentation

## 2019-05-24 LAB — LIPASE, BLOOD: Lipase: 31 U/L (ref 11–51)

## 2019-05-24 LAB — COMPREHENSIVE METABOLIC PANEL
ALT: 16 U/L (ref 0–44)
AST: 17 U/L (ref 15–41)
Albumin: 4.2 g/dL (ref 3.5–5.0)
Alkaline Phosphatase: 52 U/L (ref 38–126)
Anion gap: 6 (ref 5–15)
BUN: 11 mg/dL (ref 6–20)
CO2: 21 mmol/L — ABNORMAL LOW (ref 22–32)
Calcium: 9.2 mg/dL (ref 8.9–10.3)
Chloride: 111 mmol/L (ref 98–111)
Creatinine, Ser: 0.98 mg/dL (ref 0.44–1.00)
GFR calc Af Amer: >60 mL/min (ref >60)
GFR calc non Af Amer: >60 mL/min (ref >60)
Glucose, Bld: 98 mg/dL (ref 70–99)
Potassium: 4.4 mmol/L (ref 3.5–5.1)
Sodium: 138 mmol/L (ref 135–145)
Total Bilirubin: 0.6 mg/dL (ref 0.3–1.2)
Total Protein: 6.2 g/dL — ABNORMAL LOW (ref 6.5–8.1)

## 2019-05-24 LAB — URINALYSIS, ROUTINE W REFLEX MICROSCOPIC
Bilirubin Urine: NEGATIVE
Glucose, UA: NEGATIVE mg/dL
Hgb urine dipstick: NEGATIVE
Ketones, ur: NEGATIVE mg/dL
Leukocytes,Ua: NEGATIVE
Nitrite: NEGATIVE
Protein, ur: NEGATIVE mg/dL
Specific Gravity, Urine: 1.017 (ref 1.005–1.030)
pH: 6 (ref 5.0–8.0)

## 2019-05-24 LAB — I-STAT BETA HCG BLOOD, ED (MC, WL, AP ONLY): I-stat hCG, quantitative: <5 m[IU]/mL (ref <5)

## 2019-05-24 LAB — CBC
HCT: 45.4 % (ref 36.0–46.0)
Hemoglobin: 14.5 g/dL (ref 12.0–15.0)
MCH: 28.6 pg (ref 26.0–34.0)
MCHC: 31.9 g/dL (ref 30.0–36.0)
MCV: 89.5 fL (ref 80.0–100.0)
Platelets: 300 10*3/uL (ref 150–400)
RBC: 5.07 MIL/uL (ref 3.87–5.11)
RDW: 12.2 % (ref 11.5–15.5)
WBC: 9.9 10*3/uL (ref 4.0–10.5)
nRBC: 0 % (ref 0.0–0.2)

## 2019-05-24 MED ORDER — FENTANYL CITRATE (PF) 100 MCG/2ML IJ SOLN
50.0000 ug | Freq: Once | INTRAMUSCULAR | Status: AC
  Administered 2019-05-24: 50 ug via INTRAVENOUS
  Filled 2019-05-24: qty 2

## 2019-05-24 MED ORDER — IOHEXOL 300 MG/ML  SOLN
100.0000 mL | Freq: Once | INTRAMUSCULAR | Status: AC | PRN
  Administered 2019-05-24: 100 mL via INTRAVENOUS

## 2019-05-24 MED ORDER — ONDANSETRON HCL 4 MG/2ML IJ SOLN
4.0000 mg | Freq: Once | INTRAMUSCULAR | Status: AC
  Administered 2019-05-24: 4 mg via INTRAVENOUS
  Filled 2019-05-24: qty 2

## 2019-05-24 MED ORDER — ONDANSETRON 4 MG PO TBDP: 4.0000 mg | ORAL_TABLET | Freq: Three times a day (TID) | ORAL | 0 refills | Status: DC | PRN

## 2019-05-24 MED ORDER — IBUPROFEN 800 MG PO TABS: 800.0000 mg | ORAL_TABLET | Freq: Three times a day (TID) | ORAL | 0 refills | Status: DC | PRN

## 2019-05-24 MED ORDER — KETOROLAC TROMETHAMINE 15 MG/ML IJ SOLN
15.0000 mg | Freq: Once | INTRAMUSCULAR | Status: AC
  Administered 2019-05-24: 15 mg via INTRAVENOUS
  Filled 2019-05-24: qty 1

## 2019-05-24 NOTE — Discharge Instructions (Addendum)
Your pain is likely due to a hemorrhagic ovarian cyst.  Please take medication prescribed as needed for pain control.  It is recommended for you to have a repeat pelvic ultrasound in 6 to 8 weeks to ensure resolution of the cyst.  There is a small chance that your pain may be related to an intermittent ovarian torsion.  If your pain returns or and or worsen, do not hesitate return to the ED for further evaluation.

## 2019-05-24 NOTE — ED Triage Notes (Signed)
Pt reports lower left abd pain since Monday 05/21/2019. Pt reports a hx of GI problems and called her GI doc who sent her to urgent care and then was sent to the ED.

## 2019-05-24 NOTE — ED Provider Notes (Signed)
[facility] EMERGENCY DEPARTMENT Provider Note   CSN: [id number] Arrival date & time: 05/24/19  1240    History   Chief Complaint Chief Complaint  Patient presents with  . Abdominal Pain    Lower left abd pains    HPI Madeline Everett is a 43 y.o. female.     The history is provided by the patient and medical records. No language interpreter was used.  Abdominal Pain  Madeline Everett is a 43 y.o. female who presents to the Emergency Department complaining of abdominal pain. She presents to the emergency department complaining of three days of left lower quadrant abdominal pain. Pain is located low in the abdomen and described as sharp and intermittent in nature initially. Since this morning her pain has been constant and achy in quality. She has associated nausea, poor appetite and food aversion. Denies any vomiting. She has experienced occasional loose stools but no overt diarrhea this week. She does have a history of constipation. She denies any fevers, dysuria, vaginal discharge. No new sexual partners. She has a history of hysterectomy, IBS, acid reflux. No prior similar symptoms in the past.  Gynecologist is Dr. Cletis Media with [facility]. Past Medical History:  Diagnosis Date  . Acid reflux   . Aortic valve disorder    Bicuspid aortic valve  . Atypical chest pain   . Complication of anesthesia    nausea & vomiting and difficult time waking up  . Compound nevus 01/2012   dysplastic, melanocytic atypia  . Depression    crying spells for a year due to stress of  infertility  . Deviated septum    Dr Ruby Cola  . Endometriosis   . FH: thyroid condition   . Gestational hypertension   . H/O candidiasis   . H/O constipation   . H/O dyspareunia   . H/O varicella    childhood  . Headache(784.0)    migraine topomax  . Heart murmur    congential bicuspid aortic valve, mitral valve prolapse  . Herniated disc 2008  . HTN (hypertension)   .  IBS (irritable bowel syndrome)   . Infertility, female   . Pelvic pain   . PONV (postoperative nausea and vomiting)    needs Scop patch  . Postpartum hypertension   . SVT (supraventricular tachycardia) ([place])   . UTI (lower urinary tract infection)   . Yeast infection     Patient Active Problem List   Diagnosis Date Noted  . Anxiety 12/26/2018  . GAD (generalized anxiety disorder) 08/12/2018  . HTN (hypertension) 02/21/2014  . SVT (supraventricular tachycardia) ([place]) 02/21/2014  . Postpartum hypertension   . Pelvic pain 11/01/2013  . DEPRESSION 05/30/2008  . Mitral valve disorder 05/30/2008  . ANAL FISSURE 05/30/2008  . BICUSPID AORTIC VALVE 05/30/2008  . DYSPHAGIA UNSPECIFIED 05/30/2008  . MIGRAINE HEADACHE 05/29/2008  . GERD 05/29/2008  . CONSTIPATION 05/29/2008  . IBS 05/29/2008  . EPIGASTRIC PAIN 05/29/2008  . Hx of endometriosis 12/24/2003    Past Surgical History:  Procedure Laterality Date  . ABDOMINAL HYSTERECTOMY    . BUNIONECTOMY  1998  . CESAREAN SECTION  2010  . COLONOSCOPY WITH PROPOFOL N/A 08/20/2015   Procedure: COLONOSCOPY WITH PROPOFOL;  Surgeon: Arta Silence, MD;  Location: [facility];  Service: Endoscopy;  Laterality: N/A;  . dermoid cyst  removed   2010  . DILATION AND CURETTAGE OF UTERUS  12/31/10  . ENTEROSCOPY N/A 08/20/2015   Procedure: ENTEROSCOPY;  Surgeon: Arta Silence,  MD;  Location: [facility];  Service: Endoscopy;  Laterality: N/A;  . FOOT SURGERY    . herniated disc surgery  2008  . LAPAROSCOPIC ENDOMETRIOSIS FULGURATION    . ROBOTIC ASSISTED TOTAL HYSTERECTOMY Bilateral 11/01/2013   Procedure: ROBOTIC ASSISTED TOTAL HYSTERECTOMY WITH BILATERAL SALPINOGECTOMY;  Surgeon: Alwyn Pea, MD;  Location: [facility];  Service: Gynecology;  Laterality: Bilateral;  . TONSILLECTOMY  1993     OB History    Gravida  3   Para  3   Term  2   Preterm  1   AB      Living  3     SAB      TAB      Ectopic      Multiple      Live  Births  3            Home Medications    Prior to Admission medications   Medication Sig Start Date End Date Taking? Authorizing Provider  acetaminophen (TYLENOL) 325 MG tablet Take 650 mg by mouth every 6 (six) hours as needed.    [provider]  albuterol (PROVENTIL HFA;VENTOLIN HFA) 108 (90 BASE) MCG/ACT inhaler Inhale 1-2 puffs into the lungs every 6 (six) hours as needed for wheezing or shortness of breath.    [provider]  b complex vitamins capsule Take 1 capsule by mouth daily.    [provider]  BREO ELLIPTA 100-25 MCG/INH AEPB Inhale 1 puff into the lungs daily.  10/02/18   [provider]  buPROPion (WELLBUTRIN SR) 150 MG 12 hr tablet Take 1 tablet (150 mg total) by mouth 2 (two) times daily. 04/17/19   Thayer Headings, PMHNP  busPIRone (BUSPAR) 15 MG tablet TAKE 1/3 OF A TABLET BY MOUTH TWICE A DAY FOR 1 WEEK, THEN 2/3 OF A TABLET TWICE A DAY FOR 1 WEEK, THEN 1 WHOLE TABLET TWICE A DAY 05/10/19   Thayer Headings, PMHNP  CLINDAMYCIN-BENZOYL PER-CLEANS EX Apply 1 application topically every morning.     [provider]  fluticasone (FLONASE) 50 MCG/ACT nasal spray Place 2 sprays into both nostrils as needed for allergies or rhinitis.     [provider]  ibuprofen (ADVIL) 800 MG tablet Take 1 tablet (800 mg total) by mouth every 8 (eight) hours as needed. 05/24/19   Quintella Reichert, MD  Magnesium 250 MG TABS Take 1 tablet by mouth every evening.    [provider]  omeprazole (PRILOSEC) 40 MG capsule Take 40 mg by mouth daily.    [provider]  ondansetron (ZOFRAN ODT) 4 MG disintegrating tablet Take 1 tablet (4 mg total) by mouth every 8 (eight) hours as needed for nausea or vomiting. 05/24/19   Quintella Reichert, MD  Probiotic Product (PROBIOTIC PO) Take by mouth.    [provider]  pseudoephedrine (SUDAFED) 30 MG tablet Take 30 mg by mouth every 6 (six) hours as needed for congestion.    [provider]  spironolactone (ALDACTONE) 25 MG tablet Take 25 mg by mouth daily.    [provider]  Topiramate ER 150 MG CS24 Take 150 mg by mouth at bedtime.     [provider]  VITAMIN D PO Take by mouth.    [provider]    Family History Family History  Problem Relation Age of Onset  . Diabetes Mother   . Hypertension Father   . Diabetes Father   . Heart disease Maternal Uncle   .  Hypertension Maternal Grandmother   . Diabetes Maternal Grandmother   . Heart disease Maternal Grandmother     Social History Social History   Tobacco Use  . Smoking status: Never Smoker  . Smokeless tobacco: Never Used  Substance Use Topics  . Alcohol use: No  . Drug use: No     Allergies   Ciprofloxacin, Codeine, Fluoxetine, Guaifenesin, Guaifenesin & derivatives, Lactose, Lactose intolerance (gi), Lactulose, Oxycodone-acetaminophen, Paxil [paroxetine hcl], Percocet [oxycodone-acetaminophen], Prozac [fluoxetine hcl], Amoxicillin, Hydrocodone-acetaminophen, Iodine, Penicillins, and Vicodin [hydrocodone-acetaminophen]   Review of Systems Review of Systems  Gastrointestinal: Positive for abdominal pain.  All other systems reviewed and are negative.    Physical Exam Updated Vital Signs BP 119/78 (BP Location: Right Arm)   Pulse 79   Temp 99.2 F (37.3 C) (Oral)   Resp 16   Ht 5\' 4"  (1.626 m)   Wt 63 kg   LMP 02/13/2012 (Exact Date)   SpO2 100%   BMI 23.86 kg/m   Physical Exam Vitals signs and nursing note reviewed.  Constitutional:      Appearance: She is well-developed.  HENT:     Head: Normocephalic and atraumatic.  Cardiovascular:     Rate and Rhythm: Normal rate and regular rhythm.  Pulmonary:     Effort: Pulmonary effort is normal. No respiratory distress.  Abdominal:     Palpations: Abdomen is soft.     Tenderness: There is no guarding or rebound.     Comments: Mild to moderate LLQ tenderness  Genitourinary:    Comments: No  significant pelvic tenderness, mild left pelvic fullness.   Musculoskeletal:        General: No tenderness.  Skin:    General: Skin is warm and dry.  Neurological:     Mental Status: She is alert and oriented to person, place, and time.  Psychiatric:        Behavior: Behavior normal.      ED Treatments / Results  Labs (all labs ordered are listed, but only abnormal results are displayed) Labs Reviewed  COMPREHENSIVE METABOLIC PANEL - Abnormal; Notable for the following components:      Result Value   CO2 21 (*)    Total Protein 6.2 (*)    All other components within normal limits  URINALYSIS, ROUTINE W REFLEX MICROSCOPIC - Abnormal; Notable for the following components:   APPearance HAZY (*)    All other components within normal limits  LIPASE, BLOOD  CBC  I-STAT BETA HCG BLOOD, ED ([facility], [facility], [facility] ONLY)    EKG None  Radiology Ct Abdomen Pelvis W Contrast  Result Date: 05/24/2019 CLINICAL DATA:  Abdominal pain, diverticulitis suspected EXAM: CT ABDOMEN AND PELVIS WITH CONTRAST TECHNIQUE: Multidetector CT imaging of the abdomen and pelvis was performed using the standard protocol following bolus administration of intravenous contrast. CONTRAST:  148mL OMNIPAQUE IOHEXOL 300 MG/ML  SOLN COMPARISON:  06/30/2016 FINDINGS: Lower chest: No acute abnormality. Hepatobiliary: No solid liver abnormality is seen. No gallstones, gallbladder wall thickening, or biliary dilatation. Pancreas: Unremarkable. No pancreatic ductal dilatation or surrounding inflammatory changes. Spleen: Normal in size without significant abnormality. Adrenals/Urinary Tract: Adrenal glands are unremarkable. Kidneys are normal, without renal calculi, solid lesion, or hydronephrosis. Bladder is unremarkable. Stomach/Bowel: Stomach is within normal limits. Appendix appears normal. No evidence of bowel wall thickening, distention, or inflammatory changes. Vascular/Lymphatic: No significant vascular findings are present. No  enlarged abdominal or pelvic lymph nodes. Reproductive: Status post hysterectomy. There is a fluid attenuation lesion in the expected vicinity  of the left ovary measuring 5.7 x 5.0 cm (series 3, image 69). Other: No abdominal wall hernia or abnormality. Small volume free fluid in the low pelvis. Musculoskeletal: No acute or significant osseous findings. IMPRESSION: 1. There is a fluid attenuation lesion in the expected vicinity of the left ovary measuring 5.7 x 5.0 cm (series 3, image 69). This can be further evaluated by ultrasound if there is concern for hemorrhagic cyst or ovarian torsion given referable clinical symptoms. 2. No significant diverticular disease or evidence of acute diverticulitis. 3. Small volume free fluid in the low pelvis, nonspecific and possibly reactive or functional. Electronically Signed   By: Eddie Candle M.D.   On: 05/24/2019 15:09    Procedures Procedures (including critical care time)  Medications Ordered in ED Medications  fentaNYL (SUBLIMAZE) injection 50 mcg (50 mcg Intravenous Given 05/24/19 1355)  ondansetron (ZOFRAN) injection 4 mg (4 mg Intravenous Given 05/24/19 1415)  iohexol (OMNIPAQUE) 300 MG/ML solution 100 mL (100 mLs Intravenous Contrast Given 05/24/19 1445)  ketorolac (TORADOL) 15 MG/ML injection 15 mg (15 mg Intravenous Given 05/24/19 1527)     Initial Impression / Assessment and Plan / ED Course  I have reviewed the triage vital signs and the nursing notes.  Pertinent labs & imaging results that were available during my care of the patient were reviewed by me and considered in my medical decision making (see chart for details).        Patient here for evaluation of left lower quadrant abdominal pain. She does have tenderness on examination without peritonitis. Pelvic examination is benign but she does have some left naxal fullness. CT scan is negative for diverticulitis but does show a possible ovarian cyst. Plan to obtain pelvic ultrasound.  Patient care transferred pending pelvic ultrasound. Anticipate if there is no evidence of torsion patient will be discharged home and ibuprofen, Zofran and OB/GYN follow-up. Final Clinical Impressions(s) / ED Diagnoses   Final diagnoses:  Cyst of left ovary  LLQ abdominal pain    ED Discharge Orders         Ordered    ibuprofen (ADVIL) 800 MG tablet  Every 8 hours PRN     05/24/19 1523    ondansetron (ZOFRAN ODT) 4 MG disintegrating tablet  Every 8 hours PRN     05/24/19 1523           Quintella Reichert, MD 05/24/19 1540

## 2019-05-24 NOTE — ED Provider Notes (Signed)
LLQ pain since Monday.  Pending pelvic US to r/o torsion although suspicion is low.  Pain likely 2/2 ovarian cyst.  F/u with OB.   4:40 PM Pregnancy test is negative, normal lipase, normal electrolyte panel, normal WBC and normal H&H.  Urine shows no signs of urinary tract infection.  Pelvic ultrasound demonstrate enlarged left ovary with complex cystic mass suggestive of hemorrhagic cyst.  It is recommended for a follow-up pelvic ultrasound in 6 to 8 weeks to assess for interval resolutions of improvement in size of the suspected hemorrhagic cyst.  Intermittent torsion is not entirely excluded given the size of the left ovary.  However, since labs are reassuring, pain is minimal at this time and symptom has been ongoing for nearly a week, low suspicion for acute ovarian torsion at this time.  Made aware to return promptly if her condition worsen.  Will discharge home with anti-inflammatory medication.  BP 119/78 (BP Location: Right Arm)   Pulse 79   Temp 99.2 F (37.3 C) (Oral)   Resp 16   Ht 5\' 4"  (1.626 m)   Wt 63 kg   LMP 02/13/2012 (Exact Date)   SpO2 100%   BMI 23.86 kg/m   Results for orders placed or performed during the hospital encounter of 05/24/19  Lipase, blood  Result Value Ref Range   Lipase 31 11 - 51 U/L  Comprehensive metabolic panel  Result Value Ref Range   Sodium 138 135 - 145 mmol/L   Potassium 4.4 3.5 - 5.1 mmol/L   Chloride 111 98 - 111 mmol/L   CO2 21 (L) 22 - 32 mmol/L   Glucose, Bld 98 70 - 99 mg/dL   BUN 11 6 - 20 mg/dL   Creatinine, Ser 0.98 0.44 - 1.00 mg/dL   Calcium 9.2 8.9 - 10.3 mg/dL   Total Protein 6.2 (L) 6.5 - 8.1 g/dL   Albumin 4.2 3.5 - 5.0 g/dL   AST 17 15 - 41 U/L   ALT 16 0 - 44 U/L   Alkaline Phosphatase 52 38 - 126 U/L   Total Bilirubin 0.6 0.3 - 1.2 mg/dL   GFR calc non Af Amer >60 >60 mL/min   GFR calc Af Amer >60 >60 mL/min   Anion gap 6 5 - 15  CBC  Result Value Ref Range   WBC 9.9 4.0 - 10.5 K/uL   RBC 5.07 3.87 - 5.11  MIL/uL   Hemoglobin 14.5 12.0 - 15.0 g/dL   HCT 45.4 36.0 - 46.0 %   MCV 89.5 80.0 - 100.0 fL   MCH 28.6 26.0 - 34.0 pg   MCHC 31.9 30.0 - 36.0 g/dL   RDW 12.2 11.5 - 15.5 %   Platelets 300 150 - 400 K/uL   nRBC 0.0 0.0 - 0.2 %  Urinalysis, Routine w reflex microscopic  Result Value Ref Range   Color, Urine YELLOW YELLOW   APPearance HAZY (A) CLEAR   Specific Gravity, Urine 1.017 1.005 - 1.030   pH 6.0 5.0 - 8.0   Glucose, UA NEGATIVE NEGATIVE mg/dL   Hgb urine dipstick NEGATIVE NEGATIVE   Bilirubin Urine NEGATIVE NEGATIVE   Ketones, ur NEGATIVE NEGATIVE mg/dL   Protein, ur NEGATIVE NEGATIVE mg/dL   Nitrite NEGATIVE NEGATIVE   Leukocytes,Ua NEGATIVE NEGATIVE  I-Stat beta hCG blood, ED  Result Value Ref Range   I-stat hCG, quantitative <5.0 <5 mIU/mL   Comment 3           US Transvaginal Non-ob  Result  Date: 05/24/2019 CLINICAL DATA:  Patient with left lower quadrant pain since Monday. EXAM: TRANSABDOMINAL ULTRASOUND OF PELVIS DOPPLER ULTRASOUND OF OVARIES TECHNIQUE: Transabdominal ultrasound examination of the pelvis was performed including evaluation of the uterus, ovaries, adnexal regions, and pelvic cul-de-sac. Color and duplex Doppler ultrasound was utilized to evaluate blood flow to the ovaries. COMPARISON:  CT abdomen pelvis 05/24/2019 FINDINGS: Uterus Surgically absent Right ovary Not visualized. Left ovary Measurements: 5.1 x 4.2 x 5.5 cm = volume: 62 mL. Within the left ovary there is a 4.6 x 4.0 x 4.7 cm complex cystic mass with lace-like internal echogenicity. Pulsed Doppler evaluation demonstrates normal low-resistance arterial and venous waveforms in the peripheral aspect of the left ovary. Other: Small volume free fluid within the pelvis and left adnexa. IMPRESSION: The left ovary is enlarged measuring up to 5.5 cm and contains a complex cystic mass. This mass has a sonographic appearance suggestive of hemorrhagic cyst. Arterial and venous flow is demonstrated within the  peripheral aspect of the left ovary on current exam however intermittent torsion as a cause of pain is not entirely excluded given the size of the left ovary. Recommend follow-up pelvic ultrasound in 6-8 weeks to assess for interval resolution or improvement in size of suspected hemorrhagic cyst left ovary. Electronically Signed   By: Lovey Newcomer M.D.   On: 05/24/2019 16:34   US Pelvis Complete  Result Date: 05/24/2019 CLINICAL DATA:  Patient with left lower quadrant pain since Monday. EXAM: TRANSABDOMINAL ULTRASOUND OF PELVIS DOPPLER ULTRASOUND OF OVARIES TECHNIQUE: Transabdominal ultrasound examination of the pelvis was performed including evaluation of the uterus, ovaries, adnexal regions, and pelvic cul-de-sac. Color and duplex Doppler ultrasound was utilized to evaluate blood flow to the ovaries. COMPARISON:  CT abdomen pelvis 05/24/2019 FINDINGS: Uterus Surgically absent Right ovary Not visualized. Left ovary Measurements: 5.1 x 4.2 x 5.5 cm = volume: 62 mL. Within the left ovary there is a 4.6 x 4.0 x 4.7 cm complex cystic mass with lace-like internal echogenicity. Pulsed Doppler evaluation demonstrates normal low-resistance arterial and venous waveforms in the peripheral aspect of the left ovary. Other: Small volume free fluid within the pelvis and left adnexa. IMPRESSION: The left ovary is enlarged measuring up to 5.5 cm and contains a complex cystic mass. This mass has a sonographic appearance suggestive of hemorrhagic cyst. Arterial and venous flow is demonstrated within the peripheral aspect of the left ovary on current exam however intermittent torsion as a cause of pain is not entirely excluded given the size of the left ovary. Recommend follow-up pelvic ultrasound in 6-8 weeks to assess for interval resolution or improvement in size of suspected hemorrhagic cyst left ovary. Electronically Signed   By: Lovey Newcomer M.D.   On: 05/24/2019 16:34   Ct Abdomen Pelvis W Contrast  Result Date:  05/24/2019 CLINICAL DATA:  Abdominal pain, diverticulitis suspected EXAM: CT ABDOMEN AND PELVIS WITH CONTRAST TECHNIQUE: Multidetector CT imaging of the abdomen and pelvis was performed using the standard protocol following bolus administration of intravenous contrast. CONTRAST:  149mL OMNIPAQUE IOHEXOL 300 MG/ML  SOLN COMPARISON:  06/30/2016 FINDINGS: Lower chest: No acute abnormality. Hepatobiliary: No solid liver abnormality is seen. No gallstones, gallbladder wall thickening, or biliary dilatation. Pancreas: Unremarkable. No pancreatic ductal dilatation or surrounding inflammatory changes. Spleen: Normal in size without significant abnormality. Adrenals/Urinary Tract: Adrenal glands are unremarkable. Kidneys are normal, without renal calculi, solid lesion, or hydronephrosis. Bladder is unremarkable. Stomach/Bowel: Stomach is within normal limits. Appendix appears normal. No evidence of bowel wall  thickening, distention, or inflammatory changes. Vascular/Lymphatic: No significant vascular findings are present. No enlarged abdominal or pelvic lymph nodes. Reproductive: Status post hysterectomy. There is a fluid attenuation lesion in the expected vicinity of the left ovary measuring 5.7 x 5.0 cm (series 3, image 69). Other: No abdominal wall hernia or abnormality. Small volume free fluid in the low pelvis. Musculoskeletal: No acute or significant osseous findings. IMPRESSION: 1. There is a fluid attenuation lesion in the expected vicinity of the left ovary measuring 5.7 x 5.0 cm (series 3, image 69). This can be further evaluated by ultrasound if there is concern for hemorrhagic cyst or ovarian torsion given referable clinical symptoms. 2. No significant diverticular disease or evidence of acute diverticulitis. 3. Small volume free fluid in the low pelvis, nonspecific and possibly reactive or functional. Electronically Signed   By: Eddie Candle M.D.   On: 05/24/2019 15:09   [place] Art/ven Flow Abd Pelv  Doppler  Result Date: 05/24/2019 CLINICAL DATA:  Patient with left lower quadrant pain since Monday. EXAM: TRANSABDOMINAL ULTRASOUND OF PELVIS DOPPLER ULTRASOUND OF OVARIES TECHNIQUE: Transabdominal ultrasound examination of the pelvis was performed including evaluation of the uterus, ovaries, adnexal regions, and pelvic cul-de-sac. Color and duplex Doppler ultrasound was utilized to evaluate blood flow to the ovaries. COMPARISON:  CT abdomen pelvis 05/24/2019 FINDINGS: Uterus Surgically absent Right ovary Not visualized. Left ovary Measurements: 5.1 x 4.2 x 5.5 cm = volume: 62 mL. Within the left ovary there is a 4.6 x 4.0 x 4.7 cm complex cystic mass with lace-like internal echogenicity. Pulsed Doppler evaluation demonstrates normal low-resistance arterial and venous waveforms in the peripheral aspect of the left ovary. Other: Small volume free fluid within the pelvis and left adnexa. IMPRESSION: The left ovary is enlarged measuring up to 5.5 cm and contains a complex cystic mass. This mass has a sonographic appearance suggestive of hemorrhagic cyst. Arterial and venous flow is demonstrated within the peripheral aspect of the left ovary on current exam however intermittent torsion as a cause of pain is not entirely excluded given the size of the left ovary. Recommend follow-up pelvic ultrasound in 6-8 weeks to assess for interval resolution or improvement in size of suspected hemorrhagic cyst left ovary. Electronically Signed   By: Lovey Newcomer M.D.   On: 05/24/2019 16:34      Domenic Moras, PA-C 05/24/19 1649    Hayden Rasmussen, MD 05/25/19 [phone]

## 2019-05-24 NOTE — ED Notes (Signed)
Patient verbalizes understanding of discharge instructions. Opportunity for questioning and answers were provided. Armband removed by staff, pt discharged from ED via wheelchair to home.  

## 2019-05-24 NOTE — ED Notes (Signed)
Patient transported to Ultrasound 

## 2019-05-28 DIAGNOSIS — D225 Melanocytic nevi of trunk: Secondary | ICD-10-CM | POA: Diagnosis not present

## 2019-05-28 DIAGNOSIS — D1724 Benign lipomatous neoplasm of skin and subcutaneous tissue of left leg: Secondary | ICD-10-CM | POA: Diagnosis not present

## 2019-05-28 DIAGNOSIS — L821 Other seborrheic keratosis: Secondary | ICD-10-CM | POA: Diagnosis not present

## 2019-05-28 DIAGNOSIS — L700 Acne vulgaris: Secondary | ICD-10-CM | POA: Diagnosis not present

## 2019-06-28 DIAGNOSIS — R102 Pelvic and perineal pain: Secondary | ICD-10-CM | POA: Diagnosis not present

## 2019-06-28 DIAGNOSIS — N83202 Unspecified ovarian cyst, left side: Secondary | ICD-10-CM | POA: Diagnosis not present

## 2019-06-28 DIAGNOSIS — K829 Disease of gallbladder, unspecified: Secondary | ICD-10-CM | POA: Diagnosis not present

## 2019-07-31 DIAGNOSIS — N83202 Unspecified ovarian cyst, left side: Secondary | ICD-10-CM | POA: Diagnosis not present

## 2019-08-01 ENCOUNTER — Ambulatory Visit: Payer: Self-pay | Admitting: General Surgery

## 2019-08-01 DIAGNOSIS — K8050 Calculus of bile duct without cholangitis or cholecystitis without obstruction: Secondary | ICD-10-CM | POA: Diagnosis not present

## 2019-08-05 ENCOUNTER — Other Ambulatory Visit: Payer: Self-pay | Admitting: Psychiatry

## 2019-08-05 DIAGNOSIS — F411 Generalized anxiety disorder: Secondary | ICD-10-CM

## 2019-08-07 NOTE — Telephone Encounter (Signed)
Last visit 05/19, nothing scheduledc

## 2019-08-21 NOTE — Pre-Procedure Instructions (Signed)
[id number] - [place], [place] - [address] [place] [address] Phone: [phone] Fax: [phone]  [facility] [place], [place] [address] [place] [address] Phone: [phone] Fax: [phone]      Your procedure is scheduled on Monday, September 28th.  Report to [facility] Main Entrance "A" at 11:00 A.M., and check in at the Admitting office.  Call this number if you have problems the morning of surgery:  [phone]  Call [phone] if you have any questions prior to your surgery date Monday-Friday 8am-4pm    Remember:  Do not eat after midnight the night before your surgery  You may drink clear liquids until 10:00 a.m. the morning of your surgery.   Clear liquids allowed are: Water, Non-Citrus Juices (without pulp), Carbonated Beverages, Clear Tea, Black Coffee Only, and Gatorade    Take these medicines the morning of surgery with A SIP OF WATER  buPROPion (WELLBUTRIN SR)  busPIRone (BUSPAR) omeprazole (PRILOSEC)  As needed: ondansetron (ZOFRAN ODT), acetaminophen (TYLENOL), inhalers, docusate sodium (COLACE).   Please bring your inhaler with you on the day of surgery.   As of today, STOP taking any Aspirin (unless otherwise instructed by your surgeon), Aleve, Naproxen, Ibuprofen, Motrin, Advil, Goody's, BC's, all herbal medications, fish oil, and all vitamins.    The Morning of Surgery  Do not wear jewelry, make-up or nail polish.  Do not wear lotions, powders, or perfumes, or deodorant  Do not shave 48 hours prior to surgery.   Do not bring valuables to the hospital.  [facility] is not responsible for any belongings or valuables.  If you are a smoker, DO NOT Smoke 24 hours prior to surgery IF you wear a CPAP at night please bring your mask, tubing, and machine the morning of surgery   Remember that you must have someone to transport you home after your surgery, and  remain with you for 24 hours if you are discharged the same day.   Contacts, glasses, hearing aids, dentures or bridgework may not be worn into surgery.    Leave your suitcase in the car.  After surgery it may be brought to your room.  For patients admitted to the hospital, discharge time will be determined by your treatment team.  Patients discharged the day of surgery will not be allowed to drive home.    Special instructions:   [place]- Preparing For Surgery  Before surgery, you can play an important role. Because skin is not sterile, your skin needs to be as free of germs as possible. You can reduce the number of germs on your skin by washing with CHG (chlorahexidine gluconate) Soap before surgery.  CHG is an antiseptic cleaner which kills germs and bonds with the skin to continue killing germs even after washing.    Oral Hygiene is also important to reduce your risk of infection.  Remember - BRUSH YOUR TEETH THE MORNING OF SURGERY WITH YOUR REGULAR TOOTHPASTE  Please do not use if you have an allergy to CHG or antibacterial soaps. If your skin becomes reddened/irritated stop using the CHG.  Do not shave (including legs and underarms) for at least 48 hours prior to first CHG shower. It is OK to shave your face.  Please follow these instructions carefully.   1. Shower the NIGHT BEFORE SURGERY and the MORNING OF SURGERY with CHG Soap.   2. If you chose to  wash your hair, wash your hair first as usual with your normal shampoo.  3. After you shampoo, rinse your hair and body thoroughly to remove the shampoo.  4. Use CHG as you would any other liquid soap. You can apply CHG directly to the skin and wash gently with a scrungie or a clean washcloth.   5. Apply the CHG Soap to your body ONLY FROM THE NECK DOWN.  Do not use on open wounds or open sores. Avoid contact with your eyes, ears, mouth and genitals (private parts). Wash Face and genitals (private parts)  with your normal  soap.   6. Wash thoroughly, paying special attention to the area where your surgery will be performed.  7. Thoroughly rinse your body with warm water from the neck down.  8. DO NOT shower/wash with your normal soap after using and rinsing off the CHG Soap.  9. Pat yourself dry with a CLEAN TOWEL.  10. Wear CLEAN PAJAMAS to bed the night before surgery, wear comfortable clothes the morning of surgery  11. Place CLEAN SHEETS on your bed the night of your first shower and DO NOT SLEEP WITH PETS.    Day of Surgery:  Do not apply any deodorants/lotions. Please shower the morning of surgery with the CHG soap  Please wear clean clothes to the hospital/surgery center.   Remember to brush your teeth WITH YOUR REGULAR TOOTHPASTE.   Please read over the following fact sheets that you were given.

## 2019-08-22 ENCOUNTER — Other Ambulatory Visit: Payer: Self-pay

## 2019-08-22 ENCOUNTER — Encounter (HOSPITAL_COMMUNITY)
Admission: RE | Admit: 2019-08-22 | Discharge: 2019-08-22 | Disposition: A | Discharge status: Home / Self Care | Payer: BC Managed Care – PPO | Source: Ambulatory Visit | Attending: General Surgery | Admitting: General Surgery

## 2019-08-22 ENCOUNTER — Encounter (HOSPITAL_COMMUNITY): Payer: Self-pay

## 2019-08-22 DIAGNOSIS — Z01812 Encounter for preprocedural laboratory examination: Secondary | ICD-10-CM | POA: Diagnosis not present

## 2019-08-22 HISTORY — DX: Family history of other specified conditions: Z84.89

## 2019-08-22 HISTORY — DX: Anxiety disorder, unspecified: F41.9

## 2019-08-22 HISTORY — DX: Chronic fatigue, unspecified: R53.82

## 2019-08-22 HISTORY — DX: Pneumonia, unspecified organism: J18.9

## 2019-08-22 HISTORY — DX: Myalgic encephalomyelitis/chronic fatigue syndrome: G93.32

## 2019-08-22 HISTORY — DX: Unspecified asthma, uncomplicated: J45.909

## 2019-08-22 LAB — CBC
HCT: 43.9 % (ref 36.0–46.0)
Hemoglobin: 14 g/dL (ref 12.0–15.0)
MCH: 28.8 pg (ref 26.0–34.0)
MCHC: 31.9 g/dL (ref 30.0–36.0)
MCV: 90.3 fL (ref 80.0–100.0)
Platelets: 316 10*3/uL (ref 150–400)
RBC: 4.86 MIL/uL (ref 3.87–5.11)
RDW: 12.1 % (ref 11.5–15.5)
WBC: 7.9 10*3/uL (ref 4.0–10.5)
nRBC: 0 % (ref 0.0–0.2)

## 2019-08-22 LAB — COMPREHENSIVE METABOLIC PANEL
ALT: 19 U/L (ref 0–44)
AST: 20 U/L (ref 15–41)
Albumin: 4.2 g/dL (ref 3.5–5.0)
Alkaline Phosphatase: 55 U/L (ref 38–126)
Anion gap: 8 (ref 5–15)
BUN: 10 mg/dL (ref 6–20)
CO2: 18 mmol/L — ABNORMAL LOW (ref 22–32)
Calcium: 9 mg/dL (ref 8.9–10.3)
Chloride: 113 mmol/L — ABNORMAL HIGH (ref 98–111)
Creatinine, Ser: 0.83 mg/dL (ref 0.44–1.00)
GFR calc Af Amer: >60 mL/min (ref >60)
GFR calc non Af Amer: >60 mL/min (ref >60)
Glucose, Bld: 101 mg/dL — ABNORMAL HIGH (ref 70–99)
Potassium: 4.3 mmol/L (ref 3.5–5.1)
Sodium: 139 mmol/L (ref 135–145)
Total Bilirubin: 0.8 mg/dL (ref 0.3–1.2)
Total Protein: 6.5 g/dL (ref 6.5–8.1)

## 2019-08-22 NOTE — Progress Notes (Signed)
PCP - cynthia white Cardiologist - traci turner  PPM/ICD - na Device Orders - na Rep Notified  Chest x-ray - na EKG - 10/06/18 Stress Test - 5/15 ECHO - 9/18 Cardiac Cath - na  Sleep Study - na CPAP -   Fasting Blood Sugar - na Checks Blood Sugar _____ times a day  Blood Thinner Instructions:na Aspirin Instructions:na  ERAS Protcol - yes PRE-SURGERY Ensure - na  COVID TEST- 9/24   Anesthesia review: cardiac history Pt. Stated last time of SVT couple months ago. Reports symptoms:feeling tired,dizzy,sometimes back or chest pain. Sometimes will last 2- 3 days.Feeling tired will last for a week. Pt. States it's under control and was instructed by Dr. Radford Pax if it persist for 2-3 weeks then to follow up with her.  Patient denies shortness of breath, fever, cough and chest pain at PAT appointment   Patient verbalized understanding of instructions that were given to them at the PAT appointment. Patient was also instructed that they will need to review over the PAT instructions again at home before surgery.

## 2019-08-23 ENCOUNTER — Other Ambulatory Visit (HOSPITAL_COMMUNITY): Payer: BC Managed Care – PPO

## 2019-08-23 ENCOUNTER — Other Ambulatory Visit (HOSPITAL_COMMUNITY)
Admission: RE | Admit: 2019-08-23 | Discharge: 2019-08-23 | Disposition: A | Discharge status: Home / Self Care | Payer: BC Managed Care – PPO | Source: Ambulatory Visit | Attending: General Surgery | Admitting: General Surgery

## 2019-08-23 DIAGNOSIS — K8050 Calculus of bile duct without cholangitis or cholecystitis without obstruction: Secondary | ICD-10-CM | POA: Diagnosis not present

## 2019-08-23 DIAGNOSIS — Z01812 Encounter for preprocedural laboratory examination: Secondary | ICD-10-CM | POA: Insufficient documentation

## 2019-08-23 DIAGNOSIS — Z20828 Contact with and (suspected) exposure to other viral communicable diseases: Secondary | ICD-10-CM | POA: Diagnosis not present

## 2019-08-23 NOTE — Anesthesia Preprocedure Evaluation (Addendum)
Anesthesia Evaluation  Patient identified by MRN, date of birth, ID band Patient awake    Reviewed: Allergy & Precautions, NPO status , Patient's Chart, lab work & pertinent test results  History of Anesthesia Complications (+) PONV  Airway Mallampati: II  TM Distance: >3 FB Neck ROM: Full    Dental no notable dental hx.    Pulmonary asthma ,    Pulmonary exam normal breath sounds clear to auscultation       Cardiovascular hypertension, Pt. on medications Normal cardiovascular exam+ Valvular Problems/Murmurs  Rhythm:Regular Rate:Normal + Systolic murmurs    Neuro/Psych  Headaches, Anxiety Depression negative psych ROS   GI/Hepatic Neg liver ROS, GERD  ,  Endo/Other  negative endocrine ROS  Renal/GU negative Renal ROS  negative genitourinary   Musculoskeletal negative musculoskeletal ROS (+)   Abdominal   Peds negative pediatric ROS (+)  Hematology negative hematology ROS (+)   Anesthesia Other Findings   Reproductive/Obstetrics negative OB ROS                            Anesthesia Physical Anesthesia Plan  ASA: II  Anesthesia Plan: General   Post-op Pain Management:    Induction: Intravenous  PONV Risk Score and Plan: 4 or greater and Ondansetron, Dexamethasone, Midazolam, Scopolamine patch - Pre-op and Treatment may vary due to age or medical condition  Airway Management Planned: Oral ETT  Additional Equipment:   Intra-op Plan:   Post-operative Plan: Extubation in OR  Informed Consent: I have reviewed the patients History and Physical, chart, labs and discussed the procedure including the risks, benefits and alternatives for the proposed anesthesia with the patient or authorized representative who has indicated his/her understanding and acceptance.     Dental advisory given  Plan Discussed with: CRNA  Anesthesia Plan Comments: (Follows yearly with cardiology Dr.  Radford Pax for hx of HTN, SVT and bicuspid AV. Echo 08/05/2017 showed bicuspid aortic valve with no evidence of thickening. Per last OV note 10/06/18 palpitations were under control.   TTE 08/05/17: - Left ventricle: The cavity size was normal. Systolic function was   normal. The estimated ejection fraction was in the range of 55%   to 60%. Wall motion was normal; there were no regional wall   motion abnormalities. - Aortic valve: Poorly visualized. Bicuspid; normal thickness   leaflets. - Pulmonary arteries: Systolic pressure could not be accurately   estimated.)       Anesthesia Quick Evaluation

## 2019-08-24 LAB — NOVEL CORONAVIRUS, NAA (HOSP ORDER, SEND-OUT TO REF LAB; TAT 18-24 HRS): SARS-CoV-2, NAA: NOT DETECTED

## 2019-08-27 ENCOUNTER — Ambulatory Visit (HOSPITAL_COMMUNITY): Payer: BC Managed Care – PPO

## 2019-08-27 ENCOUNTER — Encounter (HOSPITAL_COMMUNITY)
Admission: RE | Disposition: A | Discharge status: Home / Self Care | Payer: Self-pay | Source: Home / Self Care | Attending: General Surgery

## 2019-08-27 ENCOUNTER — Ambulatory Visit (HOSPITAL_COMMUNITY): Payer: BC Managed Care – PPO | Admitting: Vascular Surgery

## 2019-08-27 ENCOUNTER — Ambulatory Visit (HOSPITAL_COMMUNITY)
Admission: RE | Admit: 2019-08-27 | Discharge: 2019-08-27 | Disposition: A | Discharge status: Home / Self Care | Payer: BC Managed Care – PPO | Attending: General Surgery | Admitting: General Surgery

## 2019-08-27 ENCOUNTER — Other Ambulatory Visit: Payer: Self-pay

## 2019-08-27 DIAGNOSIS — F419 Anxiety disorder, unspecified: Secondary | ICD-10-CM | POA: Diagnosis not present

## 2019-08-27 DIAGNOSIS — F418 Other specified anxiety disorders: Secondary | ICD-10-CM | POA: Diagnosis not present

## 2019-08-27 DIAGNOSIS — K219 Gastro-esophageal reflux disease without esophagitis: Secondary | ICD-10-CM | POA: Diagnosis not present

## 2019-08-27 DIAGNOSIS — I10 Essential (primary) hypertension: Secondary | ICD-10-CM | POA: Diagnosis not present

## 2019-08-27 DIAGNOSIS — K8050 Calculus of bile duct without cholangitis or cholecystitis without obstruction: Secondary | ICD-10-CM | POA: Diagnosis not present

## 2019-08-27 DIAGNOSIS — Z79899 Other long term (current) drug therapy: Secondary | ICD-10-CM | POA: Insufficient documentation

## 2019-08-27 DIAGNOSIS — J45909 Unspecified asthma, uncomplicated: Secondary | ICD-10-CM | POA: Diagnosis not present

## 2019-08-27 DIAGNOSIS — K811 Chronic cholecystitis: Secondary | ICD-10-CM | POA: Insufficient documentation

## 2019-08-27 DIAGNOSIS — Z7951 Long term (current) use of inhaled steroids: Secondary | ICD-10-CM | POA: Diagnosis not present

## 2019-08-27 DIAGNOSIS — F329 Major depressive disorder, single episode, unspecified: Secondary | ICD-10-CM | POA: Insufficient documentation

## 2019-08-27 DIAGNOSIS — R109 Unspecified abdominal pain: Secondary | ICD-10-CM | POA: Diagnosis present

## 2019-08-27 DIAGNOSIS — Z419 Encounter for procedure for purposes other than remedying health state, unspecified: Secondary | ICD-10-CM

## 2019-08-27 HISTORY — PX: CHOLECYSTECTOMY: SHX55

## 2019-08-27 SURGERY — LAPAROSCOPIC CHOLECYSTECTOMY WITH INTRAOPERATIVE CHOLANGIOGRAM
Anesthesia: General

## 2019-08-27 MED ORDER — GABAPENTIN 300 MG PO CAPS
300.0000 mg | ORAL_CAPSULE | ORAL | Status: AC
  Administered 2019-08-27: 11:00:00 300 mg via ORAL

## 2019-08-27 MED ORDER — CELECOXIB 200 MG PO CAPS
ORAL_CAPSULE | ORAL | Status: AC
  Administered 2019-08-27: 200 mg via ORAL
  Filled 2019-08-27: qty 1

## 2019-08-27 MED ORDER — HYDROMORPHONE HCL 1 MG/ML IJ SOLN: 0.2500 mg | INTRAMUSCULAR | Status: DC | PRN

## 2019-08-27 MED ORDER — 0.9 % SODIUM CHLORIDE (POUR BTL) OPTIME
TOPICAL | Status: DC | PRN
  Administered 2019-08-27: 1000 mL

## 2019-08-27 MED ORDER — STERILE WATER FOR IRRIGATION IR SOLN
Status: DC | PRN
  Administered 2019-08-27: 1000 mL

## 2019-08-27 MED ORDER — CHLORHEXIDINE GLUCONATE CLOTH 2 % EX PADS: 6.0000 | MEDICATED_PAD | Freq: Once | CUTANEOUS | Status: DC

## 2019-08-27 MED ORDER — MEPERIDINE HCL 25 MG/ML IJ SOLN: 6.2500 mg | INTRAMUSCULAR | Status: DC | PRN

## 2019-08-27 MED ORDER — SODIUM CHLORIDE 0.9 % IV SOLN
INTRAVENOUS | Status: DC | PRN
  Administered 2019-08-27: 12:00:00 100 mL

## 2019-08-27 MED ORDER — VANCOMYCIN HCL IN DEXTROSE 1-5 GM/200ML-% IV SOLN
1000.0000 mg | INTRAVENOUS | Status: AC
  Administered 2019-08-27: 11:00:00 1000 mg via INTRAVENOUS

## 2019-08-27 MED ORDER — PROPOFOL 10 MG/ML IV BOLUS
INTRAVENOUS | Status: DC | PRN
  Administered 2019-08-27: 130 mg via INTRAVENOUS

## 2019-08-27 MED ORDER — VANCOMYCIN HCL IN DEXTROSE 1-5 GM/200ML-% IV SOLN
INTRAVENOUS | Status: AC
  Administered 2019-08-27: 1000 mg via INTRAVENOUS
  Filled 2019-08-27: qty 200

## 2019-08-27 MED ORDER — MIDAZOLAM HCL 2 MG/2ML IJ SOLN
INTRAMUSCULAR | Status: AC
  Filled 2019-08-27: qty 2

## 2019-08-27 MED ORDER — SCOPOLAMINE 1 MG/3DAYS TD PT72
MEDICATED_PATCH | TRANSDERMAL | Status: DC | PRN
  Administered 2019-08-27: 1 via TRANSDERMAL

## 2019-08-27 MED ORDER — DROPERIDOL 2.5 MG/ML IJ SOLN
1.2500 mg | INTRAMUSCULAR | Status: AC
  Administered 2019-08-27: .625 mg via INTRAVENOUS
  Filled 2019-08-27: qty 2

## 2019-08-27 MED ORDER — CELECOXIB 200 MG PO CAPS
200.0000 mg | ORAL_CAPSULE | ORAL | Status: AC
  Administered 2019-08-27: 11:00:00 200 mg via ORAL

## 2019-08-27 MED ORDER — DEXAMETHASONE SODIUM PHOSPHATE 10 MG/ML IJ SOLN
INTRAMUSCULAR | Status: DC | PRN
  Administered 2019-08-27: 10 mg via INTRAVENOUS

## 2019-08-27 MED ORDER — LACTATED RINGERS IV SOLN
INTRAVENOUS | Status: DC
  Administered 2019-08-27: 11:00:00 via INTRAVENOUS

## 2019-08-27 MED ORDER — BUPIVACAINE-EPINEPHRINE 0.5% -1:200000 IJ SOLN
INTRAMUSCULAR | Status: DC | PRN
  Administered 2019-08-27: 18 mL

## 2019-08-27 MED ORDER — ONDANSETRON HCL 4 MG/2ML IJ SOLN
INTRAMUSCULAR | Status: DC | PRN
  Administered 2019-08-27: 4 mg via INTRAVENOUS

## 2019-08-27 MED ORDER — ROCURONIUM BROMIDE 10 MG/ML (PF) SYRINGE
PREFILLED_SYRINGE | INTRAVENOUS | Status: DC | PRN
  Administered 2019-08-27: 30 mg via INTRAVENOUS
  Administered 2019-08-27: 20 mg via INTRAVENOUS

## 2019-08-27 MED ORDER — SODIUM CHLORIDE 0.9 % IR SOLN
Status: DC | PRN
  Administered 2019-08-27: 1000 mL

## 2019-08-27 MED ORDER — GABAPENTIN 300 MG PO CAPS
ORAL_CAPSULE | ORAL | Status: AC
  Administered 2019-08-27: 300 mg via ORAL
  Filled 2019-08-27: qty 1

## 2019-08-27 MED ORDER — FENTANYL CITRATE (PF) 250 MCG/5ML IJ SOLN
INTRAMUSCULAR | Status: DC | PRN
  Administered 2019-08-27: 50 ug via INTRAVENOUS
  Administered 2019-08-27: 100 ug via INTRAVENOUS
  Administered 2019-08-27: 50 ug via INTRAVENOUS

## 2019-08-27 MED ORDER — LIDOCAINE 2% (20 MG/ML) 5 ML SYRINGE
INTRAMUSCULAR | Status: DC | PRN
  Administered 2019-08-27: 60 mg via INTRAVENOUS

## 2019-08-27 MED ORDER — PROMETHAZINE HCL 25 MG/ML IJ SOLN: 6.2500 mg | INTRAMUSCULAR | Status: DC | PRN

## 2019-08-27 MED ORDER — FENTANYL CITRATE (PF) 250 MCG/5ML IJ SOLN
INTRAMUSCULAR | Status: AC
  Filled 2019-08-27: qty 5

## 2019-08-27 MED ORDER — HYDROCODONE-ACETAMINOPHEN 5-325 MG PO TABS: 1.0000 | ORAL_TABLET | Freq: Four times a day (QID) | ORAL | 0 refills | Status: DC | PRN

## 2019-08-27 MED ORDER — BUPIVACAINE-EPINEPHRINE (PF) 0.5% -1:200000 IJ SOLN
INTRAMUSCULAR | Status: AC
  Filled 2019-08-27: qty 30

## 2019-08-27 MED ORDER — PHENYLEPHRINE 40 MCG/ML (10ML) SYRINGE FOR IV PUSH (FOR BLOOD PRESSURE SUPPORT)
PREFILLED_SYRINGE | INTRAVENOUS | Status: DC | PRN
  Administered 2019-08-27 (×2): 80 ug via INTRAVENOUS
  Administered 2019-08-27: 120 ug via INTRAVENOUS

## 2019-08-27 MED ORDER — SUCCINYLCHOLINE CHLORIDE 20 MG/ML IJ SOLN
INTRAMUSCULAR | Status: DC | PRN
  Administered 2019-08-27: 120 mg via INTRAVENOUS

## 2019-08-27 MED ORDER — PROPOFOL 10 MG/ML IV BOLUS
INTRAVENOUS | Status: AC
  Filled 2019-08-27: qty 20

## 2019-08-27 MED ORDER — SUGAMMADEX SODIUM 500 MG/5ML IV SOLN
INTRAVENOUS | Status: DC | PRN
  Administered 2019-08-27: 150 mg via INTRAVENOUS

## 2019-08-27 MED ORDER — MIDAZOLAM HCL 5 MG/5ML IJ SOLN
INTRAMUSCULAR | Status: DC | PRN
  Administered 2019-08-27: 2 mg via INTRAVENOUS

## 2019-08-27 SURGICAL SUPPLY — 39 items
ADH SKN CLS APL DERMABOND .7 (GAUZE/BANDAGES/DRESSINGS) ×1
APL PRP STRL LF DISP 70% ISPRP (MISCELLANEOUS) ×1
APPLIER CLIP 5 13 M/L LIGAMAX5 (MISCELLANEOUS) ×2
APR CLP MED LRG 5 ANG JAW (MISCELLANEOUS) ×1
BAG SPEC RTRVL LRG 6X4 10 (ENDOMECHANICALS) ×1
BLADE CLIPPER SURG (BLADE) IMPLANT
CANISTER SUCT 3000ML PPV (MISCELLANEOUS) ×2 IMPLANT
CATH REDDICK CHOLANGI 4FR 50CM (CATHETERS) ×2 IMPLANT
CHLORAPREP W/TINT 26 (MISCELLANEOUS) ×2 IMPLANT
CLIP APPLIE 5 13 M/L LIGAMAX5 (MISCELLANEOUS) ×1 IMPLANT
COVER MAYO STAND STRL (DRAPES) ×2 IMPLANT
COVER SURGICAL LIGHT HANDLE (MISCELLANEOUS) ×2 IMPLANT
COVER WAND RF STERILE (DRAPES) ×2 IMPLANT
DERMABOND ADVANCED (GAUZE/BANDAGES/DRESSINGS) ×1
DERMABOND ADVANCED .7 DNX12 (GAUZE/BANDAGES/DRESSINGS) ×1 IMPLANT
DRAPE C-ARM 42X72 X-RAY (DRAPES) ×2 IMPLANT
ELECT REM PT RETURN 9FT ADLT (ELECTROSURGICAL) ×2
ELECTRODE REM PT RTRN 9FT ADLT (ELECTROSURGICAL) ×1 IMPLANT
GLOVE BIO SURGEON STRL SZ7.5 (GLOVE) ×6 IMPLANT
GOWN STRL REUS W/ TWL LRG LVL3 (GOWN DISPOSABLE) ×3 IMPLANT
GOWN STRL REUS W/TWL LRG LVL3 (GOWN DISPOSABLE) ×12
IV CATH 14GX2 1/4 (CATHETERS) ×2 IMPLANT
KIT BASIN OR (CUSTOM PROCEDURE TRAY) ×2 IMPLANT
KIT TURNOVER KIT B (KITS) ×2 IMPLANT
NS IRRIG 1000ML POUR BTL (IV SOLUTION) ×2 IMPLANT
PAD ARMBOARD 7.5X6 YLW CONV (MISCELLANEOUS) ×2 IMPLANT
POUCH SPECIMEN RETRIEVAL 10MM (ENDOMECHANICALS) ×2 IMPLANT
SCISSORS LAP 5X35 DISP (ENDOMECHANICALS) ×2 IMPLANT
SET IRRIG TUBING LAPAROSCOPIC (IRRIGATION / IRRIGATOR) ×2 IMPLANT
SET TUBE SMOKE EVAC HIGH FLOW (TUBING) ×2 IMPLANT
SLEEVE ENDOPATH XCEL 5M (ENDOMECHANICALS) ×4 IMPLANT
SPECIMEN JAR SMALL (MISCELLANEOUS) ×2 IMPLANT
SUT MNCRL AB 4-0 PS2 18 (SUTURE) ×2 IMPLANT
TOWEL GREEN STERILE (TOWEL DISPOSABLE) ×2 IMPLANT
TOWEL GREEN STERILE FF (TOWEL DISPOSABLE) ×2 IMPLANT
TRAY LAPAROSCOPIC MC (CUSTOM PROCEDURE TRAY) ×2 IMPLANT
TROCAR XCEL BLUNT TIP 100MML (ENDOMECHANICALS) ×2 IMPLANT
TROCAR XCEL NON-BLD 5MMX100MML (ENDOMECHANICALS) ×2 IMPLANT
WATER STERILE IRR 1000ML POUR (IV SOLUTION) ×2 IMPLANT

## 2019-08-27 NOTE — Anesthesia Postprocedure Evaluation (Signed)
Anesthesia Post Note  Patient: Madeline Everett  Procedure(s) Performed: LAPAROSCOPIC CHOLECYSTECTOMY (N/A )     Patient location during evaluation: PACU Anesthesia Type: General Level of consciousness: awake and alert Pain management: pain level controlled Vital Signs Assessment: post-procedure vital signs reviewed and stable Respiratory status: spontaneous breathing, nonlabored ventilation and respiratory function stable Cardiovascular status: blood pressure returned to baseline and stable Postop Assessment: no apparent nausea or vomiting Anesthetic complications: no    Last Vitals:  Vitals:   08/27/19 1330 08/27/19 1335  BP:    Pulse: 76 79  Resp: 13 13  Temp: (!) 36.1 C   SpO2: 99% 98%    Last Pain:  Vitals:   08/27/19 1330  PainSc: 0-No pain                 Lynda Rainwater

## 2019-08-27 NOTE — Interval H&P Note (Signed)
History and Physical Interval Note:  08/27/2019 10:57 AM  Madeline Everett  has presented today for surgery, with the diagnosis of BILIARY COLIC.  The various methods of treatment have been discussed with the patient and family. After consideration of risks, benefits and other options for treatment, the patient has consented to  Procedure(s): LAPAROSCOPIC CHOLECYSTECTOMY WITH INTRAOPERATIVE CHOLANGIOGRAM (N/A) as a surgical intervention.  The patient's history has been reviewed, patient examined, no change in status, stable for surgery.  I have reviewed the patient's chart and labs.  Questions were answered to the patient's satisfaction.     [name]

## 2019-08-27 NOTE — H&P (Signed)
Madeline Everett  Location: [facility] Patient #: [id number] DOB: Mar 29, 1976 Married / Language: English / Race: White Female   History of Present Illness  The patient is a 43 year old female who presents with abdominal pain. We are asked to see the patient in consultation by Dr. Katharine Look Rivard to evaluate her for biliary colic. The patient is a 43 year old white female who has been experiencing abdominal pain for the last year. The pain is mostly in the epigastric and right upper quadrant region. The pain seems to radiate around to her back. The pain is been associated with some significant nausea and vomiting. The pain has been getting more frequent and now she has pain every day. She has lost 20 pounds in the last year because she is unable to eat. Her CT of the abdomen and pelvis and her HIDA scan or a relatively normal except for some ovarian cyst.   Allergies Penicillins  Ciprofloxacin *CHEMICALS*  Codeine and Related  FLUoxetine HCl (PMDD) *PSYCHOTHERAPEUTIC AND NEUROLOGICAL AGENTS - MISC.*  guaiFENesin *COUGH/COLD/ALLERGY*  Lactose *PHARMACEUTICAL ADJUVANTS*  Lactulose *LAXATIVES*  oxyCODONE HCl *ANALGESICS - OPIOID*  Nausea, Vomiting. Paxil *ANTIDEPRESSANTS*  Percocet *ANALGESICS - OPIOID*  PROzac *ANTIDEPRESSANTS*  Amoxicillin *PENICILLINS*  Rash. HYDROcodone-Acetaminophen *ANALGESICS - OPIOID*  Iodine (Antiseptic) *ANTISEPTICS & DISINFECTANTS*  Rash. Vicodin *ANALGESICS - OPIOID*  Nausea. Allergies Reconciled   Medication History Albuterol Sulfate HFA (108 (90 Base)MCG/ACT Aerosol Soln, Inhalation) Active. Breo Ellipta (100-25MCG/INH Aero Pow Br Act, Inhalation) Active. Ondansetron (8MG  Tablet Disint, Oral) Active. Omeprazole (20MG  Capsule DR, Oral) Active. Topiramate (100MG  Tablet, Oral) Active. Promethazine-DM (6.25-15MG /5ML Syrup, Oral) Active. [employer] Digestive Probiotic (250MG  Capsule, Oral) Active. Famotidine (20MG  Tablet,  Oral) Active. Cefuroxime Axetil (500MG  Tablet, Oral) Active. Spironolactone (25MG  Tablet, Oral) Active. Bupropion HCl (100MG  Tablet ER, Oral) Active. BuSpar (5MG  Tablet, Oral) Active. buPROPion HCl ER (SR) (150MG  Tablet ER 12HR, Oral) Active. Medications Reconciled    Review of Systems  General Not Present- Appetite Loss, Chills, Fatigue, Fever, Night Sweats, Weight Gain and Weight Loss. Note: All other systems negative (unless as noted in HPI & included Review of Systems) Skin Not Present- Change in Wart/Mole, Dryness, Hives, Jaundice, New Lesions, Non-Healing Wounds, Rash and Ulcer. HEENT Not Present- Earache, Hearing Loss, Hoarseness, Nose Bleed, Oral Ulcers, Ringing in the Ears, Seasonal Allergies, Sinus Pain, Sore Throat, Visual Disturbances, Wears glasses/contact lenses and Yellow Eyes. Respiratory Not Present- Bloody sputum, Chronic Cough, Difficulty Breathing, Snoring and Wheezing. Breast Not Present- Breast Mass, Breast Pain, Nipple Discharge and Skin Changes. Cardiovascular Not Present- Chest Pain, Difficulty Breathing Lying Down, Leg Cramps, Palpitations, Rapid Heart Rate, Shortness of Breath and Swelling of Extremities. Gastrointestinal Not Present- Abdominal Pain, Bloating, Bloody Stool, Change in Bowel Habits, Chronic diarrhea, Constipation, Difficulty Swallowing, Excessive gas, Gets full quickly at meals, Hemorrhoids, Indigestion, Nausea, Rectal Pain and Vomiting. Female Genitourinary Not Present- Frequency, Nocturia, Painful Urination, Pelvic Pain and Urgency. Musculoskeletal Not Present- Back Pain, Joint Pain, Joint Stiffness, Muscle Pain, Muscle Weakness and Swelling of Extremities. Neurological Not Present- Decreased Memory, Fainting, Headaches, Numbness, Seizures, Tingling, Tremor, Trouble walking and Weakness. Psychiatric Not Present- Anxiety, Bipolar, Change in Sleep Pattern, Depression, Fearful and Frequent crying. Endocrine Not Present- Cold Intolerance, Excessive  Hunger, Hair Changes, Heat Intolerance, Hot flashes and New Diabetes. Hematology Not Present- Easy Bruising, Excessive bleeding, Gland problems, HIV and Persistent Infections.  Vitals  Weight: 136.38 lb Height: 64in Body Surface Area: 1.66 m Body Mass Index: 23.41 kg/m  Temp.: 97.61F (Temporal)  Pulse: 118 (Regular)  BP: 136/84(Sitting, Left Arm, Standard)       Physical Exam  General Mental Status-Alert. General Appearance-Consistent with stated age. Hydration-Well hydrated. Voice-Normal.  Head and Neck Head-normocephalic, atraumatic with no lesions or palpable masses. Trachea-midline. Thyroid Gland Characteristics - normal size and consistency.  Eye Eyeball - Bilateral-Extraocular movements intact. Sclera/Conjunctiva - Bilateral-No scleral icterus.  Chest and Lung Exam Chest and lung exam reveals -quiet, even and easy respiratory effort with no use of accessory muscles and on auscultation, normal breath sounds, no adventitious sounds and normal vocal resonance. Inspection Chest Wall - Normal. Back - normal.  Cardiovascular Cardiovascular examination reveals -normal heart sounds, regular rate and rhythm with no murmurs and normal pedal pulses bilaterally.  Abdomen Note: The abdomen is soft with mild to moderate epigastric and right upper quadrant tenderness. There is no palpable mass.   Neurologic Neurologic evaluation reveals -alert and oriented x 3 with no impairment of recent or remote memory. Mental Status-Normal.  Musculoskeletal Normal Exam - Left-Upper Extremity Strength Normal and Lower Extremity Strength Normal. Normal Exam - Right-Upper Extremity Strength Normal and Lower Extremity Strength Normal.  Lymphatic Head & Neck  General Head & Neck Lymphatics: Bilateral - Description - Normal. Axillary  General Axillary Region: Bilateral - Description - Normal. Tenderness - Non Tender. Femoral &  Inguinal  Generalized Femoral & Inguinal Lymphatics: Bilateral - Description - Normal. Tenderness - Non Tender.    Assessment & Plan   BILIARY COLIC ([id number]) Impression: The patient has been experiencing right upper quadrant and epigastric pain for the last year that certainly has the characteristics of a biliary colic type pain although her imaging workup has been unremarkable. Because of the nature of her pain she is interested in having her gallbladder removed. I have discussed with her in detail the risks and benefits of the operation as well as some of the technical aspects and she understands and wishes to proceed. She also understands that there is no guarantee that removing the gallbladder will fix her pain since her workup has been relatively unremarkable.

## 2019-08-27 NOTE — Op Note (Signed)
08/27/2019  12:16 PM  PATIENT:  Madeline Everett  43 y.o. female  PRE-OPERATIVE DIAGNOSIS:  BILIARY COLIC  POST-OPERATIVE DIAGNOSIS:  BILIARY COLIC  PROCEDURE:  Procedure(s): LAPAROSCOPIC CHOLECYSTECTOMY (N/A)  SURGEON:  Surgeon(s) and Role:    * Jovita Kussmaul, MD - Primary  PHYSICIAN ASSISTANT:   ASSISTANTS: Kathrine Haddock, RNFA   ANESTHESIA:   local and general  EBL:  minimal   BLOOD ADMINISTERED:none  DRAINS: none   LOCAL MEDICATIONS USED:  MARCAINE     SPECIMEN:  Source of Specimen:  gallbladder  DISPOSITION OF SPECIMEN:  PATHOLOGY  COUNTS:  YES  TOURNIQUET:  * No tourniquets in log *  DICTATION: .Dragon Dictation     Procedure: After informed consent was obtained the patient was brought to the operating room and placed in the supine position on the operating room table. After adequate induction of general anesthesia the patient's abdomen was prepped with ChloraPrep allowed to dry and draped in usual sterile manner. An appropriate timeout was performed. The area below the umbilicus was infiltrated with quarter percent  Marcaine. A small incision was made with a 15 blade knife. The incision was carried down through the subcutaneous tissue bluntly with a hemostat and [employer] retractors. The linea alba was identified. The linea alba was incised with a 15 blade knife and each side was grasped with Coker clamps. The preperitoneal space was then probed with a hemostat until the peritoneum was opened and access was gained to the abdominal cavity. A 0 Vicryl pursestring stitch was placed in the fascia surrounding the opening. A Hassan cannula was then placed through the opening and anchored in place with the previously placed Vicryl purse string stitch. The abdomen was insufflated with carbon dioxide without difficulty. A laparoscope was inserted through the [facility] cannula in the right upper quadrant was inspected. Next the epigastric region was infiltrated with % Marcaine. A  small incision was made with a 15 blade knife. A 5 mm port was placed bluntly through this incision into the abdominal cavity under direct vision. Next 2 sites were chosen laterally on the right side of the abdomen for placement of 5 mm ports. Each of these areas was infiltrated with quarter percent Marcaine. Small stab incisions were made with a 15 blade knife. 5 mm ports were then placed bluntly through these incisions into the abdominal cavity under direct vision without difficulty. A blunt grasper was placed through the lateralmost 5 mm port and used to grasp the dome of the gallbladder and elevated anteriorly and superiorly. Another blunt grasper was placed through the other 5 mm port and used to retract the body and neck of the gallbladder. A dissector was placed through the epigastric port and using the electrocautery the peritoneal reflection at the gallbladder neck was opened. Blunt dissection was then carried out in this area until the gallbladder neck-cystic duct junction was readily identified and a good window was created. A single clip was placed on the gallbladder neck. A small  ductotomy was made just below the clip with laparoscopic scissors. A 14-gauge Angiocath was then placed through the anterior abdominal wall under direct vision. A Reddick cholangiogram catheter was then placed through the Angiocath and flushed.  The catheter could not be fed into the cystic duct because the duct was too small.  The anatomy was well seen in the critical window was dissected out well.  3 clips were placed proximally on the cystic duct and the duct was divided between the 2 sets  of clips.  Posterior this the cystic artery was also identified and dissected circumferentially until a good window was created.  2 clips were placed proximally 1 distally on the artery and the artery was divided between the 2.  Next a laparoscopic hook cautery device was used to separate the gallbladder from the liver bed. Prior to  completely detaching the gallbladder from the liver bed the liver bed was inspected and several small bleeding points were coagulated with the electrocautery until the area was completely hemostatic. The gallbladder was then detached the rest of it from the liver bed without difficulty. A laparoscopic bag was inserted through the hassan port. The laparoscope was moved to the epigastric port. The gallbladder was placed within the bag and the bag was sealed.  The bag with the gallbladder was then removed with the [facility] cannula through the infraumbilical port without difficulty. The fascial defect was then closed with the previously placed Vicryl pursestring stitch as well as with another figure-of-eight 0 Vicryl stitch. The liver bed was inspected again and found to be hemostatic. The abdomen was irrigated with copious amounts of saline until the effluent was clear. The ports were then removed under direct vision without difficulty and were found to be hemostatic. The gas was allowed to escape. The skin incisions were all closed with interrupted 4-0 Monocryl subcuticular stitches. Dermabond dressings were applied. The patient tolerated the procedure well. At the end of the case all needle sponge and instrument counts were correct. The patient was then awakened and taken to recovery in stable condition  PLAN OF CARE: Discharge to home after PACU  PATIENT DISPOSITION:  PACU - hemodynamically stable.   Delay start of Pharmacological VTE agent (>24hrs) due to surgical blood loss or risk of bleeding: not applicable

## 2019-08-27 NOTE — Transfer of Care (Signed)
Immediate Anesthesia Transfer of Care Note  Patient: Madeline Everett  Procedure(s) Performed: LAPAROSCOPIC CHOLECYSTECTOMY (N/A )  Patient Location: PACU  Anesthesia Type:General  Level of Consciousness: awake, alert , oriented and patient cooperative  Airway & Oxygen Therapy: Patient Spontanous Breathing and Patient connected to face mask oxygen  Post-op Assessment: Report given to RN, Post -op Vital signs reviewed and stable and Patient moving all extremities  Post vital signs: Reviewed and stable  Last Vitals:  Vitals Value Taken Time  BP 126/84 08/27/19 1226  Temp    Pulse 75 08/27/19 1229  Resp 17 08/27/19 1229  SpO2 100 % 08/27/19 1229  Vitals shown include unvalidated device data.  Last Pain: There were no vitals filed for this visit.       Complications: No apparent anesthesia complications

## 2019-08-27 NOTE — Anesthesia Procedure Notes (Addendum)
Procedure Name: Intubation Date/Time: 08/27/2019 11:15 AM Performed by: Myna Bright, CRNA Pre-anesthesia Checklist: Patient identified, Emergency Drugs available, Suction available, Patient being monitored and Timeout performed Patient Re-evaluated:Patient Re-evaluated prior to induction Oxygen Delivery Method: Circle system utilized Preoxygenation: Pre-oxygenation with 100% oxygen Induction Type: Rapid sequence and IV induction Laryngoscope Size: Miller and 2 Grade View: Grade I Tube type: Oral Number of attempts: 1 Airway Equipment and Method: Stylet Secured at: 20 cm Tube secured with: Tape Dental Injury: Teeth and Oropharynx as per pre-operative assessment

## 2019-08-28 ENCOUNTER — Encounter (HOSPITAL_COMMUNITY): Payer: Self-pay | Admitting: General Surgery

## 2019-08-28 LAB — SURGICAL PATHOLOGY

## 2019-10-22 DIAGNOSIS — G43019 Migraine without aura, intractable, without status migrainosus: Secondary | ICD-10-CM | POA: Diagnosis not present

## 2019-10-22 DIAGNOSIS — G43719 Chronic migraine without aura, intractable, without status migrainosus: Secondary | ICD-10-CM | POA: Diagnosis not present

## 2019-10-22 DIAGNOSIS — G43111 Migraine with aura, intractable, with status migrainosus: Secondary | ICD-10-CM | POA: Diagnosis not present

## 2019-11-13 ENCOUNTER — Other Ambulatory Visit: Payer: Self-pay | Admitting: Psychiatry

## 2019-11-13 DIAGNOSIS — F411 Generalized anxiety disorder: Secondary | ICD-10-CM

## 2019-11-13 NOTE — Telephone Encounter (Signed)
Last apt 05/19, hasn't had follow up

## 2019-12-03 DIAGNOSIS — B349 Viral infection, unspecified: Secondary | ICD-10-CM | POA: Diagnosis not present

## 2019-12-03 DIAGNOSIS — J0101 Acute recurrent maxillary sinusitis: Secondary | ICD-10-CM | POA: Diagnosis not present

## 2019-12-10 NOTE — Progress Notes (Signed)
Cardiology Office Note:    Date:  12/11/2019   ID:  Madeline Everett, DOB 1976/08/02, MRN [id number]  PCP:  Harlan Stains, MD  Cardiologist:  Fransico Him, MD    Referring MD: Harlan Stains, MD   Chief Complaint  Patient presents with  . Follow-up    Bicuspid AV, SVT, HTN    History of Present Illness:    Madeline Everett is a 44 y.o. female with a hx of HTN, SVT and bicuspid AV.  She is here today for followup.  She denies any chest pain or pressure, SOB, DOE, PND, orthopnea or LE edema.  She has had some palpitations recently lasting for 1-2 minutes at a time. She also has been having "spells" where she will all of a sudden lose control of her body.  She does not feel lightheaded or like she is going to pass out but cannot use her body.  This has occurred when she has been driving.  She told her HA MD about her sx but no further workup recommended.  These are occurring 1-2 times per month and sometimes are associated with the palpitations.  She thinks there may be an anxiety component to them.  Recently she has started getting nauseated whenever she gets in a car to drive. She is compliant with her meds and is tolerating meds with no SE.    Past Medical History:  Diagnosis Date  . Acid reflux   . Anxiety   . Aortic valve disorder    Bicuspid aortic valve  . Asthma   . Atypical chest pain   . Chronic fatigue syndrome   . Complication of anesthesia    nausea & vomiting and difficult time waking up  . Compound nevus 01/2012   dysplastic, melanocytic atypia  . Depression    crying spells for a year due to stress of  infertility  . Deviated septum    Dr Ruby Cola  . Endometriosis   . Family history of adverse reaction to anesthesia    sister-n/v  . FH: thyroid condition   . Gestational hypertension   . H/O candidiasis   . H/O constipation   . H/O dyspareunia   . H/O varicella    childhood  . Headache(784.0)    migraine topomax  . Heart murmur    congential bicuspid  aortic valve, mitral valve prolapse  . Herniated disc 2008  . HTN (hypertension)   . IBS (irritable bowel syndrome)   . Infertility, female   . Pelvic pain   . Pneumonia    hx of  . PONV (postoperative nausea and vomiting)    needs Scop patch  . Postpartum hypertension    not being treated now  . SVT (supraventricular tachycardia) ([place])   . UTI (lower urinary tract infection)   . Yeast infection     Past Surgical History:  Procedure Laterality Date  . ABDOMINAL HYSTERECTOMY    . BUNIONECTOMY  1998  . CESAREAN SECTION  2010  . CHOLECYSTECTOMY N/A 08/27/2019   Procedure: LAPAROSCOPIC CHOLECYSTECTOMY;  Surgeon: Jovita Kussmaul, MD;  Location: [facility];  Service: General;  Laterality: N/A;  . COLONOSCOPY WITH PROPOFOL N/A 08/20/2015   Procedure: COLONOSCOPY WITH PROPOFOL;  Surgeon: Arta Silence, MD;  Location: [facility];  Service: Endoscopy;  Laterality: N/A;  . dermoid cyst  removed   2010  . DILATION AND CURETTAGE OF UTERUS  12/31/10  . ENTEROSCOPY N/A 08/20/2015   Procedure: ENTEROSCOPY;  Surgeon: Arta Silence, MD;  Location: [facility];  Service: Endoscopy;  Laterality: N/A;  . FOOT SURGERY    . herniated disc surgery  2008  . LAPAROSCOPIC ENDOMETRIOSIS FULGURATION    . ROBOTIC ASSISTED TOTAL HYSTERECTOMY Bilateral 11/01/2013   Procedure: ROBOTIC ASSISTED TOTAL HYSTERECTOMY WITH BILATERAL SALPINOGECTOMY;  Surgeon: Alwyn Pea, MD;  Location: [facility];  Service: Gynecology;  Laterality: Bilateral;  . TONSILLECTOMY  1993    Current Medications: Current Meds  Medication Sig  . acetaminophen (TYLENOL) 325 MG tablet Take 650 mg by mouth every 6 (six) hours as needed for moderate pain.   Marland Kitchen albuterol (PROVENTIL HFA;VENTOLIN HFA) 108 (90 BASE) MCG/ACT inhaler Inhale 1-2 puffs into the lungs every 6 (six) hours as needed for wheezing or shortness of breath.  Marland Kitchen b complex vitamins capsule Take 1 capsule by mouth daily.  Marland Kitchen BREO ELLIPTA 100-25 MCG/INH AEPB Inhale 1 puff into the lungs  daily as needed (shortness of breath).   Marland Kitchen buPROPion (WELLBUTRIN SR) 150 MG 12 hr tablet Take 1 tablet (150 mg total) by mouth 2 (two) times daily.  . busPIRone (BUSPAR) 15 MG tablet Take 1 tablet (15 mg total) by mouth 2 (two) times daily.  Marland Kitchen CLINDAMYCIN-BENZOYL PER-CLEANS EX Apply 1 application topically daily as needed (break outs).   . docusate sodium (COLACE) 100 MG capsule Take 100 mg by mouth daily as needed for mild constipation.  . Magnesium 250 MG TABS Take 250 mg by mouth every evening.   Marland Kitchen omeprazole (PRILOSEC) 20 MG capsule Take 20 mg by mouth daily.   . ondansetron (ZOFRAN ODT) 4 MG disintegrating tablet Take 1 tablet (4 mg total) by mouth every 8 (eight) hours as needed for nausea or vomiting.  . Probiotic Product (ALIGN) 4 MG CAPS Take 4 mg by mouth daily.  . rizatriptan (MAXALT) 10 MG tablet Take 10 mg by mouth as needed for migraine. May repeat in 2 hours if needed  . spironolactone (ALDACTONE) 25 MG tablet Take 25 mg by mouth daily.  . Topiramate ER 150 MG CS24 Take 150 mg by mouth at bedtime.      Allergies:   Ciprofloxacin, Penicillins, Paxil [paroxetine hcl], Amoxicillin, Codeine, Fluoxetine, Guaifenesin & derivatives, Hydrocodone-acetaminophen, Iodine, Lactose intolerance (gi), Lactulose, and Percocet [oxycodone-acetaminophen]   Social History   Socioeconomic History  . Marital status: Married    Spouse name: Not on file  . Number of children: Not on file  . Years of education: Not on file  . Highest education level: Not on file  Occupational History  . Not on file  Tobacco Use  . Smoking status: Never Smoker  . Smokeless tobacco: Never Used  Substance and Sexual Activity  . Alcohol use: Yes    Comment: occasionally  . Drug use: No  . Sexual activity: Yes    Birth control/protection: Pill  Other Topics Concern  . Not on file  Social History Narrative  . Not on file   Social Determinants of Health   Financial Resource Strain:   . Difficulty of Paying  Living Expenses: Not on file  Food Insecurity:   . Worried About Charity fundraiser in the Last Year: Not on file  . Ran Out of Food in the Last Year: Not on file  Transportation Needs:   . Lack of Transportation (Medical): Not on file  . Lack of Transportation (Non-Medical): Not on file  Physical Activity:   . Days of Exercise per Week: Not on file  . Minutes of Exercise per Session: Not  on file  Stress:   . Feeling of Stress : Not on file  Social Connections:   . Frequency of Communication with Friends and Family: Not on file  . Frequency of Social Gatherings with Friends and Family: Not on file  . Attends Religious Services: Not on file  . Active Member of Clubs or Organizations: Not on file  . Attends Archivist Meetings: Not on file  . Marital Status: Not on file     Family History: The patient's family history includes Diabetes in her father, maternal grandmother, and mother; Heart disease in her maternal grandmother and maternal uncle; Hypertension in her father and maternal grandmother.  ROS:   Please see the history of present illness.    ROS  All other systems reviewed and negative.   EKGs/Labs/Other Studies Reviewed:    The following studies were reviewed today: none  EKG:  EKG is  ordered today.  The ekg ordered today demonstrates NSR with no ST changes  Recent Labs: 08/22/2019: ALT 19; BUN 10; Creatinine, Ser 0.83; Hemoglobin 14.0; Platelets 316; Potassium 4.3; Sodium 139   Recent Lipid Panel No results found for: CHOL, TRIG, HDL, CHOLHDL, VLDL, LDLCALC, LDLDIRECT  Physical Exam:    VS:  BP 114/78   Pulse 89   Ht 5\' 4"  (1.626 m)   Wt 136 lb 12.8 oz (62.1 kg)   LMP 02/13/2012 (Exact Date)   BMI 23.48 kg/m     Wt Readings from Last 3 Encounters:  12/11/19 136 lb 12.8 oz (62.1 kg)  08/27/19 135 lb 9.3 oz (61.5 kg)  08/22/19 135 lb 8 oz (61.5 kg)     GEN:  Well nourished, well developed in no acute distress HEENT: Normal NECK: No JVD; No  carotid bruits LYMPHATICS: No lymphadenopathy CARDIAC: RRR, no murmurs, rubs, gallops RESPIRATORY:  Clear to auscultation without rales, wheezing or rhonchi  ABDOMEN: Soft, non-tender, non-distended MUSCULOSKELETAL:  No edema; No deformity  SKIN: Warm and dry NEUROLOGIC:  Alert and oriented x 3 PSYCHIATRIC:  Normal affect   ASSESSMENT:    1. BICUSPID AORTIC VALVE   2. Essential hypertension   3. SVT (supraventricular tachycardia) (HCC)    PLAN:    In order of problems listed above:  1.  Bicuspid AV -2D echo 2018 showed normal LVF with bicuspid AV but otherwise normal leaflets -Cardiac MRI in 2017 with no aortic aneurysm -repeat 2D echo to make sure AV is stable  2.  HTN -BP controlled -continue spiro 25mg  daily -creatinine stable at 0.83 and K+ 4.3  3.  SVT -she has recently been having palpitations lasting a few minutes at a time and sometimes associated with spells where she cannot move her body or her head -I will get a 30 day event monitor since these only occur 1-2 times monthly -she has not required any BB or CCB  4.  ? Seizures -she has been having spells where all of a sudden she cannot control her body.  She is unable to move her head or her body.  This can occur when driving or when at home.  She does not feel like she is going to pass out.  She discussed this with her headache MD but no recommendations made.   -will get an event monitor to rule out arrhythmias as a cause since she has had some palpitations -refer to Dr. Jannifer Franklin with Neuro  Medication Adjustments/Labs and Tests Ordered: Current medicines are reviewed at length with the patient today.  Concerns regarding  medicines are outlined above.  Orders Placed This Encounter  Procedures  . Ambulatory referral to Neurology  . Cardiac event monitor  . EKG 12/Charge capture  . ECHOCARDIOGRAM COMPLETE   No orders of the defined types were placed in this encounter.   Signed, Fransico Him, MD  12/11/2019  11:08 AM    [facility]

## 2019-12-11 ENCOUNTER — Encounter: Payer: Self-pay | Admitting: Neurology

## 2019-12-11 ENCOUNTER — Ambulatory Visit: Payer: BC Managed Care – PPO | Admitting: Neurology

## 2019-12-11 ENCOUNTER — Encounter: Payer: Self-pay | Admitting: Cardiology

## 2019-12-11 ENCOUNTER — Ambulatory Visit (INDEPENDENT_AMBULATORY_CARE_PROVIDER_SITE_OTHER): Payer: BC Managed Care – PPO | Admitting: Cardiology

## 2019-12-11 ENCOUNTER — Other Ambulatory Visit: Payer: Self-pay

## 2019-12-11 ENCOUNTER — Telehealth: Payer: Self-pay | Admitting: *Deleted

## 2019-12-11 VITALS — BP 114/78 | HR 89 | Ht 64.0 in | Wt 136.8 lb

## 2019-12-11 VITALS — BP 118/78 | HR 97 | Temp 98.2°F | Ht 64.0 in | Wt 137.0 lb

## 2019-12-11 DIAGNOSIS — R55 Syncope and collapse: Secondary | ICD-10-CM

## 2019-12-11 DIAGNOSIS — Q231 Congenital insufficiency of aortic valve: Secondary | ICD-10-CM

## 2019-12-11 DIAGNOSIS — I471 Supraventricular tachycardia: Secondary | ICD-10-CM | POA: Diagnosis not present

## 2019-12-11 DIAGNOSIS — I10 Essential (primary) hypertension: Secondary | ICD-10-CM | POA: Diagnosis not present

## 2019-12-11 NOTE — Patient Instructions (Signed)
Medication Instructions:  Your physician recommends that you continue on your current medications as directed. Please refer to the Current Medication list given to you today.  *If you need a refill on your cardiac medications before your next appointment, please call your pharmacy*  Testing/Procedures: Your physician has requested that you have an echocardiogram. Echocardiography is a painless test that uses sound waves to create images of your heart. It provides your doctor with information about the size and shape of your heart and how well your heart's chambers and valves are working. This procedure takes approximately one hour. There are no restrictions for this procedure.  Your physician has recommended that you wear an event monitor. Event monitors are medical devices that record the heart's electrical activity. Doctors most often [place] these monitors to diagnose arrhythmias. Arrhythmias are problems with the speed or rhythm of the heartbeat. The monitor is a small, portable device. You can wear one while you do your normal daily activities. This is usually used to diagnose what is causing palpitations/syncope (passing out).   Follow-Up: At [facility], you and your health needs are our priority.  As part of our continuing mission to provide you with exceptional heart care, we have created designated Provider Care Teams.  These Care Teams include your primary Cardiologist (physician) and Advanced Practice Providers (APPs -  Physician Assistants and Nurse Practitioners) who all work together to provide you with the care you need, when you need it.  Your next appointment:   1 year(s)  The format for your next appointment:   In Person  Provider:   You may see Fransico Him, MD or one of the following Advanced Practice Providers on your designated Care Team:    Melina Copa, PA-C  Ermalinda Barrios, PA-C   Other Instructions We have referred you to Neurology, Dr. Tobey Grim office.

## 2019-12-11 NOTE — Progress Notes (Signed)
Reason for visit: Near syncope  Referring physician: Dr. Lindalou Hose Madeline Everett is a 44 y.o. female  History of present illness:  [name] is a 44 year old right-handed white female with a 10-year history of intermittent episodes of near syncope.  The patient indicates that the episodes have become more frequent over the last 6 to 8 months since her children have been out of school with the Covid virus.  The patient has a long history of a underlying anxiety disorder as well.  She currently is on BuSpar and Wellbutrin.  She has had increased stress for the above reasons.  The patient is having stereotyped events that often times but not always occur while driving.  She may have episodes at home as well.  The patient reports that she will have onset of feeling lightheaded, nauseated, she may be diaphoretic.  She has been told that she looks pale during the events.  The patient has learned that if she is driving she can pull over and do deep breathing exercises which seems to abort the event.  If she allows the event to continue it may last up to 4 or 5 minutes, but usually lasts only 30 seconds to 60 seconds.  During the episode, she feels her heart racing.  She may feel somewhat short winded.  With the full-blown event, she may have tunnel vision but she has never lost consciousness.  She denies any headache before, during, or after the events.  She does have a history of migraine headaches and she has been on Topamax for a number of years.  She is followed by Dr. Domingo Cocking for this.  The patient denies any numbness of extremities, she denies any weakness.  During the episode she feels as if she cannot move her arms or legs properly, she can remember all events that happened during the episode.  She denies any problems controlling the bowels or the bladder.  She was seen through cardiology today, she will be put on a cardiac monitor for 30 days.  She is sent to this office to evaluate other events  that could be associated with the spells above.  Past Medical History:  Diagnosis Date  . Acid reflux   . Anxiety   . Aortic valve disorder    Bicuspid aortic valve  . Asthma   . Atypical chest pain   . Chronic fatigue syndrome   . Complication of anesthesia    nausea & vomiting and difficult time waking up  . Compound nevus 01/2012   dysplastic, melanocytic atypia  . Depression    crying spells for a year due to stress of  infertility  . Deviated septum    Dr Ruby Cola  . Endometriosis   . Family history of adverse reaction to anesthesia    sister-n/v  . FH: thyroid condition   . Gestational hypertension   . H/O candidiasis   . H/O constipation   . H/O dyspareunia   . H/O varicella    childhood  . Headache(784.0)    migraine topomax  . Heart murmur    congential bicuspid aortic valve, mitral valve prolapse  . Herniated disc 2008  . HTN (hypertension)   . IBS (irritable bowel syndrome)   . Infertility, female   . Pelvic pain   . Pneumonia    hx of  . PONV (postoperative nausea and vomiting)    needs Scop patch  . Postpartum hypertension    not being treated now  .  SVT (supraventricular tachycardia) ([place])   . UTI (lower urinary tract infection)   . Yeast infection     Past Surgical History:  Procedure Laterality Date  . ABDOMINAL HYSTERECTOMY    . BUNIONECTOMY  1998  . CESAREAN SECTION  2010  . CHOLECYSTECTOMY N/A 08/27/2019   Procedure: LAPAROSCOPIC CHOLECYSTECTOMY;  Surgeon: Jovita Kussmaul, MD;  Location: [facility];  Service: General;  Laterality: N/A;  . COLONOSCOPY WITH PROPOFOL N/A 08/20/2015   Procedure: COLONOSCOPY WITH PROPOFOL;  Surgeon: Arta Silence, MD;  Location[facility];  Service: Endoscopy;  Laterality: N/A;  . dermoid cyst  removed   2010  . DILATION AND CURETTAGE OF UTERUS  12/31/10  . ENTEROSCOPY N/A 08/20/2015   Procedure: ENTEROSCOPY;  Surgeon: Arta Silence, MD;  Location: [facility];  Service: Endoscopy;  Laterality: N/A;  . FOOT  SURGERY    . herniated disc surgery  2008  . LAPAROSCOPIC ENDOMETRIOSIS FULGURATION    . ROBOTIC ASSISTED TOTAL HYSTERECTOMY Bilateral 11/01/2013   Procedure: ROBOTIC ASSISTED TOTAL HYSTERECTOMY WITH BILATERAL SALPINOGECTOMY;  Surgeon: Alwyn Pea, MD;  Location: [facility];  Service: Gynecology;  Laterality: Bilateral;  . TONSILLECTOMY  1993    Family History  Problem Relation Age of Onset  . Diabetes Mother   . Other Father        medical history unknown  . Heart disease Maternal Uncle   . Hypertension Maternal Grandmother   . Diabetes Maternal Grandmother   . Heart disease Maternal Grandmother     Social history:  reports that she has never smoked. She has never used smokeless tobacco. She reports current alcohol use. She reports that she does not use drugs.  Medications:  Prior to Admission medications   Medication Sig Start Date End Date Taking? Authorizing Provider  albuterol (PROVENTIL HFA;VENTOLIN HFA) 108 (90 BASE) MCG/ACT inhaler Inhale 1-2 puffs into the lungs every 6 (six) hours as needed for wheezing or shortness of breath.   Yes [provider]  b complex vitamins capsule Take 1 capsule by mouth daily.   Yes [provider]  BREO ELLIPTA 100-25 MCG/INH AEPB Inhale 1 puff into the lungs daily as needed (shortness of breath).  10/02/18  Yes [provider]  buPROPion (WELLBUTRIN SR) 150 MG 12 hr tablet Take 1 tablet (150 mg total) by mouth 2 (two) times daily. 04/17/19  Yes Thayer Headings, PMHNP  busPIRone (BUSPAR) 15 MG tablet Take 1 tablet (15 mg total) by mouth 2 (two) times daily. 11/13/19  Yes Thayer Headings, PMHNP  CLINDAMYCIN-BENZOYL PER-CLEANS EX Apply 1 application topically daily as needed (break outs).    Yes [provider]  docusate sodium (COLACE) 100 MG capsule Take 100 mg by mouth daily as needed for mild constipation.   Yes [provider]  Magnesium 250 MG TABS Take 250 mg by mouth every evening.    Yes [provider]  omeprazole (PRILOSEC) 20 MG capsule Take 20 mg by mouth daily.    Yes [provider]  ondansetron (ZOFRAN ODT) 4 MG disintegrating tablet Take 1 tablet (4 mg total) by mouth every 8 (eight) hours as needed for nausea or vomiting. 05/24/19  Yes Quintella Reichert, MD  Probiotic Product (ALIGN) 4 MG CAPS Take 4 mg by mouth daily.   Yes [provider]  rizatriptan (MAXALT) 10 MG tablet Take 10 mg by mouth as needed for migraine. May repeat in 2 hours if needed   Yes [provider]  spironolactone (ALDACTONE) 25 MG  tablet Take 25 mg by mouth daily.   Yes [provider]  Topiramate ER 150 MG CS24 Take 150 mg by mouth at bedtime.    Yes [provider]      Allergies  Allergen Reactions  . Ciprofloxacin Swelling    SWELLING REACTION UNSPECIFIED   . Penicillins Rash and Other (See Comments)    Has patient had a PCN reaction causing immediate rash, facial/tongue/throat swelling, SOB or lightheadedness with hypotension: No Has patient had a PCN reaction causing severe rash involving mucus membranes or skin necrosis: Yes  Has patient had a PCN reaction that required hospitalization No Has patient had a PCN reaction occurring within the last 10 years: Yes    . Paxil [Paroxetine Hcl] Other (See Comments)    UNSPECIFIED REACTION  [Does not remember]  . Amoxicillin Rash  . Codeine Nausea And Vomiting  . Fluoxetine Other (See Comments)    Headaches  . Guaifenesin & Derivatives Nausea And Vomiting    GI  . Hydrocodone-Acetaminophen Nausea Only  . Iodine Rash  . Lactose Intolerance (Gi) Other (See Comments)    GI  . Lactulose Nausea And Vomiting    GI  . Percocet [Oxycodone-Acetaminophen] Nausea And Vomiting    ROS:  Out of a complete 14 system review of symptoms, the patient complains only of the following symptoms, and all other reviewed systems are negative.  Anxiety Migraine headache Near syncope  Blood pressure 118/78,  pulse 97, temperature 98.2 F (36.8 C), height 5\' 4"  (1.626 m), weight 137 lb (62.1 kg), last menstrual period 02/13/2012.  Physical Exam  General: The patient is alert and cooperative at the time of the examination.  Eyes: Pupils are equal, round, and reactive to light. Discs are flat bilaterally.  Neck: The neck is supple, no carotid bruits are noted.  Respiratory: The respiratory examination is clear.  Cardiovascular: The cardiovascular examination reveals a regular rate and rhythm, no obvious murmurs or rubs are noted.  Skin: Extremities are without significant edema.  Neurologic Exam  Mental status: The patient is alert and oriented x 3 at the time of the examination. The patient has apparent normal recent and remote memory, with an apparently normal attention span and concentration ability.  Cranial nerves: Facial symmetry is present. There is good sensation of the face to pinprick and soft touch bilaterally. The strength of the facial muscles and the muscles to head turning and shoulder shrug are normal bilaterally. Speech is well enunciated, no aphasia or dysarthria is noted. Extraocular movements are full. Visual fields are full. The tongue is midline, and the patient has symmetric elevation of the soft palate. No obvious hearing deficits are noted.  Motor: The motor testing reveals 5 over 5 strength of all 4 extremities. Good symmetric motor tone is noted throughout.  Sensory: Sensory testing is intact to pinprick, soft touch, vibration sensation, and position sense on all 4 extremities. No evidence of extinction is noted.  Coordination: Cerebellar testing reveals good finger-nose-finger and heel-to-shin bilaterally.  Gait and station: Gait is normal. Tandem gait is normal. Romberg is negative. No drift is seen.  Reflexes: Deep tendon reflexes are symmetric and normal bilaterally. Toes are downgoing bilaterally.   Assessment/Plan:  1.  History of anxiety  2.  Near  syncopal events  3.  Migraine headache  The patient describes events that sound to some degree as if it may be a vasovagal type episode with dizziness, nausea, diaphoresis, tunnel vision, and blanching of the face.  The  patient however never blacks out and she does not have altered consciousness.  She feels as if she cannot move her arms or legs during the event.  She is getting a cardiac work-up as she reports an elevated heart rate during the event.  It is possible that anxiety type episodes may be a source of the above events.  The patient will be evaluated for seizures or other neurologic events.  If the EEG study and MRI of the brain are unremarkable, we may consider getting her off of the Wellbutrin and switching her to Prozac as this may help vasovagal episodes and reduce anxiety.  She has been on this medication previously which was a bit more effective for her anxiety in the past.  She will follow-up here in 4 months.  Jill Alexanders MD 12/11/2019 2:10 PM  [facility] [address] [place],  [address]  Phone [phone] Fax [phone]

## 2019-12-11 NOTE — Telephone Encounter (Signed)
[employer] to ship a 30 day cardiac event monitor to the patients home.  Instructions will be included in the monitor kit. 

## 2019-12-13 ENCOUNTER — Ambulatory Visit: Payer: BC Managed Care – PPO | Admitting: Neurology

## 2019-12-13 ENCOUNTER — Other Ambulatory Visit: Payer: Self-pay

## 2019-12-13 ENCOUNTER — Telehealth: Payer: Self-pay | Admitting: Neurology

## 2019-12-13 DIAGNOSIS — R55 Syncope and collapse: Secondary | ICD-10-CM | POA: Diagnosis not present

## 2019-12-13 NOTE — Procedures (Signed)
    History:  Madeline Everett is a 44 year old patient with a history of episodes over the last 10 years where she will have near syncopal events with associated sensation of paralysis.  She may become lightheaded and diaphoretic, and look pale during the events.  If she stops and does deep breathing exercises she can prevent worsening of the event.  She has never blacked out but she might get tunnel vision with the episodes.  The patient is being evaluated for these episodes.  This is a routine EEG.  No skull defects are noted.  Medications include albuterol, Breo, Wellbutrin, BuSpar, Colace, magnesium, Prilosec, and Aldactone.  EEG classification: Normal awake  Description of the recording: The background rhythms of this recording consists of a fairly well modulated medium amplitude alpha rhythm of 9 Hz that is reactive to eye opening and closure. As the record progresses, the patient appears to remain in the waking state throughout the recording. Photic stimulation was performed, resulting in a bilateral and symmetric photic driving response. Hyperventilation was also performed, resulting in a minimal buildup of the background rhythm activities without significant slowing seen. At no time during the recording does there appear to be evidence of spike or spike wave discharges or evidence of focal slowing. EKG monitor shows no evidence of cardiac rhythm abnormalities with a heart rate of 78.  Impression: This is a normal EEG recording in the waking state. No evidence of ictal or interictal discharges are seen.

## 2019-12-13 NOTE — Telephone Encounter (Signed)
I called the patient.  EEG study was normal. 

## 2019-12-17 ENCOUNTER — Telehealth: Payer: Self-pay | Admitting: Neurology

## 2019-12-17 ENCOUNTER — Other Ambulatory Visit: Payer: Self-pay | Admitting: Psychiatry

## 2019-12-17 DIAGNOSIS — F411 Generalized anxiety disorder: Secondary | ICD-10-CM

## 2019-12-17 NOTE — Telephone Encounter (Signed)
Called Patient and left her a message asking to call the office back I need the customer service or Pre- auth number  to call her insurance and get pre-auth for MRI.

## 2019-12-17 NOTE — Telephone Encounter (Signed)
Patient returned call   [phone] Prior Review Authorization  [phone] Customer Service

## 2019-12-18 ENCOUNTER — Encounter (INDEPENDENT_AMBULATORY_CARE_PROVIDER_SITE_OTHER): Payer: BC Managed Care – PPO

## 2019-12-18 DIAGNOSIS — Q231 Congenital insufficiency of aortic valve: Secondary | ICD-10-CM | POA: Diagnosis not present

## 2019-12-18 DIAGNOSIS — I10 Essential (primary) hypertension: Secondary | ICD-10-CM | POA: Diagnosis not present

## 2019-12-18 DIAGNOSIS — I471 Supraventricular tachycardia: Secondary | ICD-10-CM | POA: Diagnosis not present

## 2019-12-24 ENCOUNTER — Ambulatory Visit (HOSPITAL_COMMUNITY): Payer: BC Managed Care – PPO | Attending: Cardiology

## 2019-12-24 ENCOUNTER — Telehealth: Payer: Self-pay | Admitting: *Deleted

## 2019-12-24 ENCOUNTER — Other Ambulatory Visit: Payer: Self-pay

## 2019-12-24 ENCOUNTER — Encounter: Payer: Self-pay | Admitting: Cardiology

## 2019-12-24 ENCOUNTER — Other Ambulatory Visit (HOSPITAL_COMMUNITY): Payer: BC Managed Care – PPO

## 2019-12-24 DIAGNOSIS — I471 Supraventricular tachycardia: Secondary | ICD-10-CM | POA: Diagnosis not present

## 2019-12-24 DIAGNOSIS — I10 Essential (primary) hypertension: Secondary | ICD-10-CM | POA: Diagnosis not present

## 2019-12-24 DIAGNOSIS — Q231 Congenital insufficiency of aortic valve: Secondary | ICD-10-CM | POA: Diagnosis not present

## 2019-12-24 NOTE — Telephone Encounter (Signed)
Left message to call office

## 2019-12-24 NOTE — Telephone Encounter (Signed)
-----   Message from Sueanne Margarita, MD sent at 12/24/2019 12:55 PM EST ----- Echo showed normal heart funciton with trivial leakiness of the MV which is normal. The aortic valve is 2 leaflet instead of 3 leaflet called bicuspid but otherwise there is no AS or AI.

## 2019-12-24 NOTE — Telephone Encounter (Signed)
I spoke with patient and reviewed echo results with her 

## 2019-12-31 ENCOUNTER — Telehealth: Payer: Self-pay

## 2019-12-31 NOTE — Telephone Encounter (Signed)
I spoke with the patient to schedule her MRI. She would like to know if this MRI is required in order for her to be treated, the cost on her MRI is very high and she wants to know if she can be treated without it.

## 2019-12-31 NOTE — Telephone Encounter (Signed)
Had to call patient back   to get ID number  [id number]  Group [id number]

## 2019-12-31 NOTE — Telephone Encounter (Signed)
I called the patient.  The cost of the MRI evaluation is high, $1100, she is basically finding the tire cost of the study, must have a high deductible for her insurance.  She is having events of syncope near syncope, this is part of the work-up.  The patient will try to get the study done.  She is currently wearing her heart monitor study.

## 2020-01-01 ENCOUNTER — Other Ambulatory Visit: Payer: Self-pay | Admitting: Psychiatry

## 2020-01-01 DIAGNOSIS — F411 Generalized anxiety disorder: Secondary | ICD-10-CM

## 2020-01-09 ENCOUNTER — Other Ambulatory Visit: Payer: Self-pay | Admitting: Psychiatry

## 2020-01-09 ENCOUNTER — Telehealth: Payer: Self-pay | Admitting: Cardiology

## 2020-01-09 DIAGNOSIS — F411 Generalized anxiety disorder: Secondary | ICD-10-CM

## 2020-01-09 NOTE — Telephone Encounter (Signed)
Attempted to reach patient. She can call the monitor company ([employer]) and request sensitive patches.

## 2020-01-09 NOTE — Telephone Encounter (Signed)
New Message  i have Madeline Everett on the line and she wants to know what Dr. Radford Pax suggest about the monitor she is wearing. she said underneath the monitor, it is blistering her up and putting sores on her.  Please call to discuss

## 2020-01-09 NOTE — Telephone Encounter (Signed)
Please have Abram Sander call and talk to her

## 2020-01-09 NOTE — Telephone Encounter (Signed)
Patient is wearing a 30 day event monitor. She has been wearing it for 21 days now. She has developed a rash and has blisters that feel like burns underneath the electrodes with the gel on them. She would like to know if there is anything that can be done about this or if she can stop wearing and go ahead and send the monitor back.

## 2020-01-10 NOTE — Telephone Encounter (Signed)
Spoke to patient and gave her a few different options she could try

## 2020-01-10 NOTE — Telephone Encounter (Signed)
Last apt 03/2019 was due back 6 weeks

## 2020-01-29 ENCOUNTER — Ambulatory Visit: Payer: BC Managed Care – PPO | Admitting: Neurology

## 2020-02-13 ENCOUNTER — Telehealth: Payer: Self-pay | Admitting: Neurology

## 2020-02-13 NOTE — Telephone Encounter (Signed)
Pt called back to schedule MRI

## 2020-02-14 ENCOUNTER — Other Ambulatory Visit: Payer: Self-pay | Admitting: Psychiatry

## 2020-02-14 DIAGNOSIS — F411 Generalized anxiety disorder: Secondary | ICD-10-CM

## 2020-02-14 NOTE — Telephone Encounter (Signed)
no to the covid questions MR Brain w/wo contrast Dr. Stephanie Acre AutH: [id number] (exp. 12/31/19 to 06/27/20) Spoke to the patient she is scheduled at [facility] for 02/19/20.

## 2020-02-15 DIAGNOSIS — Z1231 Encounter for screening mammogram for malignant neoplasm of breast: Secondary | ICD-10-CM | POA: Diagnosis not present

## 2020-02-15 DIAGNOSIS — R350 Frequency of micturition: Secondary | ICD-10-CM | POA: Diagnosis not present

## 2020-02-15 DIAGNOSIS — Z01419 Encounter for gynecological examination (general) (routine) without abnormal findings: Secondary | ICD-10-CM | POA: Diagnosis not present

## 2020-02-15 DIAGNOSIS — R309 Painful micturition, unspecified: Secondary | ICD-10-CM | POA: Diagnosis not present

## 2020-02-15 DIAGNOSIS — Z6822 Body mass index (BMI) 22.0-22.9, adult: Secondary | ICD-10-CM | POA: Diagnosis not present

## 2020-02-15 DIAGNOSIS — Z3040 Encounter for surveillance of contraceptives, unspecified: Secondary | ICD-10-CM | POA: Diagnosis not present

## 2020-02-19 ENCOUNTER — Ambulatory Visit: Payer: BC Managed Care – PPO

## 2020-02-19 ENCOUNTER — Other Ambulatory Visit: Payer: Self-pay

## 2020-02-19 DIAGNOSIS — R55 Syncope and collapse: Secondary | ICD-10-CM

## 2020-02-19 MED ORDER — GADOBENATE DIMEGLUMINE 529 MG/ML IV SOLN: 12.0000 mL/kg | Freq: Once | INTRAVENOUS | Status: AC | PRN

## 2020-02-19 NOTE — Progress Notes (Signed)
MRI brain w/wo contrast was reported as normal; please update pt.

## 2020-02-20 ENCOUNTER — Telehealth: Payer: Self-pay | Admitting: *Deleted

## 2020-02-20 NOTE — Telephone Encounter (Signed)
I spoke to patient and provided her with the MRI brain results. She would like to know if Dr. Jannifer Franklin plans to change her Wellbutrin to Prozac. She is aware that we will send this question to him and that he will return to the office on 02/25/20. She is okay with waiting for his response.

## 2020-02-20 NOTE — Telephone Encounter (Signed)
-----   Message from Star Age, MD sent at 02/19/2020  4:33 PM EDT ----- MRI brain w/wo contrast was reported as normal; please update pt.

## 2020-02-21 MED ORDER — FLUOXETINE HCL 20 MG PO CAPS: ORAL_CAPSULE | ORAL | 1 refills | Status: DC

## 2020-02-21 NOTE — Addendum Note (Signed)
Addended by: Kathrynn Ducking on: 02/21/2020 05:15 PM   Modules accepted: Orders

## 2020-02-21 NOTE — Telephone Encounter (Signed)
I called patient.  EEG and MRI of the brain were normal.  We had discussed previously about switching her off of the Wellbutrin going to Prozac which may reduce the frequency of vasovagal syncope to some degree.  We will go to 1 Wellbutrin tablet daily for 2 weeks and then stop the medication.  We will start 20 mg of Prozac for 2 weeks and then go to 40 mg daily.   On list of allergies, Prozac is listed as resulting in some headaches.

## 2020-03-14 ENCOUNTER — Other Ambulatory Visit: Payer: Self-pay | Admitting: Neurology

## 2020-03-17 ENCOUNTER — Other Ambulatory Visit: Payer: Self-pay

## 2020-03-17 MED ORDER — FLUOXETINE HCL 20 MG PO CAPS: ORAL_CAPSULE | ORAL | 2 refills | Status: DC

## 2020-04-23 ENCOUNTER — Other Ambulatory Visit: Payer: Self-pay

## 2020-04-23 ENCOUNTER — Ambulatory Visit (INDEPENDENT_AMBULATORY_CARE_PROVIDER_SITE_OTHER): Payer: BC Managed Care – PPO | Admitting: Neurology

## 2020-04-23 ENCOUNTER — Encounter: Payer: Self-pay | Admitting: Neurology

## 2020-04-23 VITALS — BP 107/74 | HR 83 | Wt 137.0 lb

## 2020-04-23 DIAGNOSIS — R55 Syncope and collapse: Secondary | ICD-10-CM | POA: Diagnosis not present

## 2020-04-23 DIAGNOSIS — F419 Anxiety disorder, unspecified: Secondary | ICD-10-CM

## 2020-04-23 MED ORDER — VENLAFAXINE HCL ER 37.5 MG PO CP24: ORAL_CAPSULE | ORAL | 1 refills | Status: DC

## 2020-04-23 NOTE — Progress Notes (Signed)
Reason for visit: Near syncope  Madeline Everett is an 44 y.o. female  History of present illness:  Madeline Everett is a 44 year old right-handed white female with a history of an anxiety disorder and a history of frequent episodes of near syncope. The patient had been on Wellbutrin, she was taken off of this and placed on Prozac but this has led to increased headache and some problems with stomach upset. The patient has reduced the dose to 20 mg daily, she returns to the office today for further evaluation. On Prozac, she has not had any significant issues with the episodes of near syncope. She does believe that this has helped her anxiety but it has made her more tired and had more difficulty focusing. She returns to the office today for evaluation.  Past Medical History:  Diagnosis Date  . Acid reflux   . Anxiety   . Aortic valve disorder    Bicuspid aortic valve - stable by echo 11/2019  . Asthma   . Atypical chest pain   . Chronic fatigue syndrome   . Complication of anesthesia    nausea & vomiting and difficult time waking up  . Compound nevus 01/2012   dysplastic, melanocytic atypia  . Depression    crying spells for a year due to stress of  infertility  . Deviated septum    Dr Ruby Cola  . Endometriosis   . Family history of adverse reaction to anesthesia    sister-n/v  . FH: thyroid condition   . Gestational hypertension   . H/O candidiasis   . H/O constipation   . H/O dyspareunia   . H/O varicella    childhood  . Headache(784.0)    migraine topomax  . Heart murmur    congential bicuspid aortic valve, mitral valve prolapse  . Herniated disc 2008  . HTN (hypertension)   . IBS (irritable bowel syndrome)   . Infertility, female   . Pelvic pain   . Pneumonia    hx of  . PONV (postoperative nausea and vomiting)    needs Scop patch  . Postpartum hypertension    not being treated now  . SVT (supraventricular tachycardia) ([place])   . UTI (lower urinary tract  infection)   . Yeast infection     Past Surgical History:  Procedure Laterality Date  . ABDOMINAL HYSTERECTOMY    . BUNIONECTOMY  1998  . CESAREAN SECTION  2010  . CHOLECYSTECTOMY N/A 08/27/2019   Procedure: LAPAROSCOPIC CHOLECYSTECTOMY;  Surgeon: Jovita Kussmaul, MD;  Location: [place];  Service: General;  Laterality: N/A;  . COLONOSCOPY WITH PROPOFOL N/A 08/20/2015   Procedure: COLONOSCOPY WITH PROPOFOL;  Surgeon: Arta Silence, MD;  Location: [facility];  Service: Endoscopy;  Laterality: N/A;  . dermoid cyst  removed   2010  . DILATION AND CURETTAGE OF UTERUS  12/31/10  . ENTEROSCOPY N/A 08/20/2015   Procedure: ENTEROSCOPY;  Surgeon: Arta Silence, MD;  Location: [facility];  Service: Endoscopy;  Laterality: N/A;  . FOOT SURGERY    . herniated disc surgery  2008  . LAPAROSCOPIC ENDOMETRIOSIS FULGURATION    . ROBOTIC ASSISTED TOTAL HYSTERECTOMY Bilateral 11/01/2013   Procedure: ROBOTIC ASSISTED TOTAL HYSTERECTOMY WITH BILATERAL SALPINOGECTOMY;  Surgeon: Alwyn Pea, MD;  Location: [facility];  Service: Gynecology;  Laterality: Bilateral;  . TONSILLECTOMY  1993    Family History  Problem Relation Age of Onset  . Diabetes Mother   . Other Father  medical history unknown  . Heart disease Maternal Uncle   . Hypertension Maternal Grandmother   . Diabetes Maternal Grandmother   . Heart disease Maternal Grandmother     Social history:  reports that she has never smoked. She has never used smokeless tobacco. She reports current alcohol use. She reports that she does not use drugs.    Allergies  Allergen Reactions  . Ciprofloxacin Swelling    SWELLING REACTION UNSPECIFIED   . Penicillins Rash and Other (See Comments)    Has patient had a PCN reaction causing immediate rash, facial/tongue/throat swelling, SOB or lightheadedness with hypotension: No Has patient had a PCN reaction causing severe rash involving mucus membranes or skin necrosis: Yes  Has patient had a PCN  reaction that required hospitalization No Has patient had a PCN reaction occurring within the last 10 years: Yes    . Paxil [Paroxetine Hcl] Other (See Comments)    UNSPECIFIED REACTION  [Does not remember]  . Amoxicillin Rash  . Codeine Nausea And Vomiting  . Fluoxetine Other (See Comments)    Headaches  . Guaifenesin & Derivatives Nausea And Vomiting    GI  . Hydrocodone-Acetaminophen Nausea Only  . Iodine Rash  . Lactose Intolerance (Gi) Other (See Comments)    GI  . Lactulose Nausea And Vomiting    GI  . Percocet [Oxycodone-Acetaminophen] Nausea And Vomiting    Medications:  Prior to Admission medications   Medication Sig Start Date End Date Taking? Authorizing Provider  albuterol (PROVENTIL HFA;VENTOLIN HFA) 108 (90 BASE) MCG/ACT inhaler Inhale 1-2 puffs into the lungs every 6 (six) hours as needed for wheezing or shortness of breath.   Yes [provider]  b complex vitamins capsule Take 1 capsule by mouth daily.   Yes [provider]  BREO ELLIPTA 100-25 MCG/INH AEPB Inhale 1 puff into the lungs daily as needed (shortness of breath).  10/02/18  Yes [provider]  docusate sodium (COLACE) 100 MG capsule Take 100 mg by mouth daily as needed for mild constipation.   Yes [provider]  FLUoxetine (PROZAC) 20 MG capsule 1 capsule daily for 2 weeks and then go to 2 daily 03/17/20  Yes Kathrynn Ducking, MD  Fluoxetine HCl, PMDD, 10 MG TABS fluoxetine 10 mg tablet   Yes [provider]  Magnesium (V-R MAGNESIUM) 250 MG TABS Take by mouth.   Yes [provider]  Magnesium 250 MG TABS Take 250 mg by mouth every evening.    Yes [provider]  omeprazole (PRILOSEC) 20 MG capsule Take 20 mg by mouth daily.    Yes [provider]  ondansetron (ZOFRAN ODT) 4 MG disintegrating tablet Take 1 tablet (4 mg total) by mouth every 8 (eight) hours as needed for nausea or vomiting. 05/24/19  Yes Quintella Reichert, MD  Probiotic  Product (ALIGN) 4 MG CAPS Take 4 mg by mouth daily.   Yes [provider]  rizatriptan (MAXALT) 10 MG tablet Take 10 mg by mouth as needed for migraine. May repeat in 2 hours if needed   Yes [provider]  spironolactone (ALDACTONE) 25 MG tablet Take 25 mg by mouth daily.   Yes [provider]  Topiramate ER 150 MG CS24 Take 150 mg by mouth at bedtime.    Yes [provider]    ROS:  Out of a complete 14 system review of symptoms, the patient complains only of the following symptoms, and all other reviewed systems are negative.  Near syncope, headache   Blood pressure 107/74, pulse 83, weight 137 lb (62.1 kg), last menstrual period 02/13/2012.  Physical Exam  General: The patient is alert and cooperative at the time of the examination.  Skin: No significant peripheral edema is noted.   Neurologic Exam  Mental status: The patient is alert and oriented x 3 at the time of the examination. The patient has apparent normal recent and remote memory, with an apparently normal attention span and concentration ability.   Cranial nerves: Facial symmetry is present. Speech is normal, no aphasia or dysarthria is noted. Extraocular movements are full. Visual fields are full.  Motor: The patient has good strength in all 4 extremities.  Sensory examination: Soft touch sensation is symmetric on the face, arms, and legs.  Coordination: The patient has good finger-nose-finger and heel-to-shin bilaterally.  Gait and station: The patient has a normal gait. Tandem gait is normal. Romberg is negative. No drift is seen.  Reflexes: Deep tendon reflexes are symmetric.   Assessment/Plan:  1. Episodic near syncope  2. Anxiety disorder  The patient will stop the Prozac, we will start Effexor working up to 3 of the 37.5 mg tablets daily, she tolerates this we will go to a 150 mg tablet. She will call me if she is not tolerating the medication. She will otherwise  follow-up in 3 months.   Jill Alexanders MD 04/23/2020 4:13 PM  [facility] [address] [place], [place] [address]  Phone [phone] Fax [phone]

## 2020-04-24 DIAGNOSIS — J40 Bronchitis, not specified as acute or chronic: Secondary | ICD-10-CM | POA: Diagnosis not present

## 2020-04-24 DIAGNOSIS — R05 Cough: Secondary | ICD-10-CM | POA: Diagnosis not present

## 2020-04-24 DIAGNOSIS — Z03818 Encounter for observation for suspected exposure to other biological agents ruled out: Secondary | ICD-10-CM | POA: Diagnosis not present

## 2020-05-09 DIAGNOSIS — J019 Acute sinusitis, unspecified: Secondary | ICD-10-CM | POA: Diagnosis not present

## 2020-06-23 ENCOUNTER — Other Ambulatory Visit: Payer: Self-pay | Admitting: Neurology

## 2020-06-23 DIAGNOSIS — G43019 Migraine without aura, intractable, without status migrainosus: Secondary | ICD-10-CM | POA: Diagnosis not present

## 2020-06-23 DIAGNOSIS — G43719 Chronic migraine without aura, intractable, without status migrainosus: Secondary | ICD-10-CM | POA: Diagnosis not present

## 2020-06-23 DIAGNOSIS — G43111 Migraine with aura, intractable, with status migrainosus: Secondary | ICD-10-CM | POA: Diagnosis not present

## 2020-07-19 ENCOUNTER — Other Ambulatory Visit: Payer: Self-pay | Admitting: Neurology

## 2020-07-23 DIAGNOSIS — K6289 Other specified diseases of anus and rectum: Secondary | ICD-10-CM | POA: Diagnosis not present

## 2020-07-23 DIAGNOSIS — K581 Irritable bowel syndrome with constipation: Secondary | ICD-10-CM | POA: Diagnosis not present

## 2020-07-23 DIAGNOSIS — K649 Unspecified hemorrhoids: Secondary | ICD-10-CM | POA: Diagnosis not present

## 2020-07-23 DIAGNOSIS — K219 Gastro-esophageal reflux disease without esophagitis: Secondary | ICD-10-CM | POA: Diagnosis not present

## 2020-08-25 ENCOUNTER — Emergency Department (HOSPITAL_BASED_OUTPATIENT_CLINIC_OR_DEPARTMENT_OTHER)
Admission: EM | Admit: 2020-08-25 | Discharge: 2020-08-26 | Disposition: A | Discharge status: Home / Self Care | Payer: BC Managed Care – PPO | Attending: Emergency Medicine | Admitting: Emergency Medicine

## 2020-08-25 ENCOUNTER — Other Ambulatory Visit: Payer: Self-pay

## 2020-08-25 ENCOUNTER — Encounter (HOSPITAL_BASED_OUTPATIENT_CLINIC_OR_DEPARTMENT_OTHER): Payer: Self-pay | Admitting: *Deleted

## 2020-08-25 DIAGNOSIS — R1013 Epigastric pain: Secondary | ICD-10-CM | POA: Diagnosis not present

## 2020-08-25 DIAGNOSIS — I10 Essential (primary) hypertension: Secondary | ICD-10-CM | POA: Diagnosis not present

## 2020-08-25 DIAGNOSIS — R112 Nausea with vomiting, unspecified: Secondary | ICD-10-CM | POA: Insufficient documentation

## 2020-08-25 DIAGNOSIS — J45909 Unspecified asthma, uncomplicated: Secondary | ICD-10-CM | POA: Insufficient documentation

## 2020-08-25 DIAGNOSIS — Z79899 Other long term (current) drug therapy: Secondary | ICD-10-CM | POA: Insufficient documentation

## 2020-08-25 DIAGNOSIS — R1010 Upper abdominal pain, unspecified: Secondary | ICD-10-CM | POA: Diagnosis not present

## 2020-08-25 LAB — COMPREHENSIVE METABOLIC PANEL
ALT: 27 U/L (ref 0–44)
AST: 21 U/L (ref 15–41)
Albumin: 4.3 g/dL (ref 3.5–5.0)
Alkaline Phosphatase: 58 U/L (ref 38–126)
Anion gap: 10 (ref 5–15)
BUN: 13 mg/dL (ref 6–20)
CO2: 24 mmol/L (ref 22–32)
Calcium: 9 mg/dL (ref 8.9–10.3)
Chloride: 104 mmol/L (ref 98–111)
Creatinine, Ser: 0.66 mg/dL (ref 0.44–1.00)
GFR calc Af Amer: >60 mL/min (ref >60)
GFR calc non Af Amer: >60 mL/min (ref >60)
Glucose, Bld: 117 mg/dL — ABNORMAL HIGH (ref 70–99)
Potassium: 4.2 mmol/L (ref 3.5–5.1)
Sodium: 138 mmol/L (ref 135–145)
Total Bilirubin: 0.5 mg/dL (ref 0.3–1.2)
Total Protein: 7.3 g/dL (ref 6.5–8.1)

## 2020-08-25 LAB — URINALYSIS, MICROSCOPIC (REFLEX)

## 2020-08-25 LAB — URINALYSIS, ROUTINE W REFLEX MICROSCOPIC
Glucose, UA: NEGATIVE mg/dL
Ketones, ur: >80 mg/dL — AB
Leukocytes,Ua: NEGATIVE
Nitrite: NEGATIVE
Protein, ur: NEGATIVE mg/dL
Specific Gravity, Urine: >1.03 — ABNORMAL HIGH (ref 1.005–1.030)
pH: 6 (ref 5.0–8.0)

## 2020-08-25 LAB — CBC WITH DIFFERENTIAL/PLATELET
Abs Immature Granulocytes: 0.05 10*3/uL (ref 0.00–0.07)
Basophils Absolute: 0 10*3/uL (ref 0.0–0.1)
Basophils Relative: 0 %
Eosinophils Absolute: 0 10*3/uL (ref 0.0–0.5)
Eosinophils Relative: 0 %
HCT: 46.9 % — ABNORMAL HIGH (ref 36.0–46.0)
Hemoglobin: 15.5 g/dL — ABNORMAL HIGH (ref 12.0–15.0)
Immature Granulocytes: 0 %
Lymphocytes Relative: 18 %
Lymphs Abs: 2.3 10*3/uL (ref 0.7–4.0)
MCH: 29.1 pg (ref 26.0–34.0)
MCHC: 33 g/dL (ref 30.0–36.0)
MCV: 88 fL (ref 80.0–100.0)
Monocytes Absolute: 0.6 10*3/uL (ref 0.1–1.0)
Monocytes Relative: 5 %
Neutro Abs: 10.1 10*3/uL — ABNORMAL HIGH (ref 1.7–7.7)
Neutrophils Relative %: 77 %
Platelets: 316 10*3/uL (ref 150–400)
RBC: 5.33 MIL/uL — ABNORMAL HIGH (ref 3.87–5.11)
RDW: 12.5 % (ref 11.5–15.5)
WBC: 13.2 10*3/uL — ABNORMAL HIGH (ref 4.0–10.5)
nRBC: 0 % (ref 0.0–0.2)

## 2020-08-25 LAB — PREGNANCY, URINE: Preg Test, Ur: NEGATIVE

## 2020-08-25 LAB — LIPASE, BLOOD: Lipase: 27 U/L (ref 11–51)

## 2020-08-25 MED ORDER — DICYCLOMINE HCL 10 MG PO CAPS: 10.0000 mg | ORAL_CAPSULE | Freq: Three times a day (TID) | ORAL | 0 refills | Status: DC | PRN

## 2020-08-25 MED ORDER — LACTATED RINGERS IV BOLUS
1000.0000 mL | Freq: Once | INTRAVENOUS | Status: AC
  Administered 2020-08-25: 1000 mL via INTRAVENOUS

## 2020-08-25 MED ORDER — METOCLOPRAMIDE HCL 10 MG PO TABS: 10.0000 mg | ORAL_TABLET | Freq: Three times a day (TID) | ORAL | 0 refills | Status: DC | PRN

## 2020-08-25 MED ORDER — DICYCLOMINE HCL 10 MG PO CAPS
10.0000 mg | ORAL_CAPSULE | Freq: Once | ORAL | Status: AC
  Administered 2020-08-25: 10 mg via ORAL
  Filled 2020-08-25: qty 1

## 2020-08-25 MED ORDER — METOCLOPRAMIDE HCL 5 MG/ML IJ SOLN
10.0000 mg | INTRAMUSCULAR | Status: AC
  Administered 2020-08-25: 10 mg via INTRAVENOUS
  Filled 2020-08-25: qty 2

## 2020-08-25 NOTE — ED Provider Notes (Signed)
[facility] EMERGENCY DEPARTMENT Provider Note   CSN: [id number] Arrival date & time: 08/25/20  1849     History Chief Complaint  Patient presents with  . Abdominal Pain    Madeline Everett is a 44 y.o. female.  44yo F w/ PMH below including GERD, IBS, chronic fatigue syndrome, endometriosis, HTN, anxiety/depression who p/w abd pain and vomiting. 2 days ago, she began feeling nauseated w/ upper abdominal pain; she took zofran 8mg .  Yesterday she began vomiting green and has continued to vomit since then. She has had less vomiting today but continues to have abdominal pain. She called her gastroenterologist who referred her to ED. No medications today PTA. She reports normal BMs, no constipation or diarrhea, fevers, cough/cold symptoms. Of note, daughter vomited once last night and has been nauseated today.   The history is provided by the patient.  Abdominal Pain      Past Medical History:  Diagnosis Date  . Acid reflux   . Anxiety   . Aortic valve disorder    Bicuspid aortic valve - stable by echo 11/2019  . Asthma   . Atypical chest pain   . Chronic fatigue syndrome   . Complication of anesthesia    nausea & vomiting and difficult time waking up  . Compound nevus 01/2012   dysplastic, melanocytic atypia  . Depression    crying spells for a year due to stress of  infertility  . Deviated septum    Dr Ruby Cola  . Endometriosis   . Family history of adverse reaction to anesthesia    sister-n/v  . FH: thyroid condition   . Gestational hypertension   . H/O candidiasis   . H/O constipation   . H/O dyspareunia   . H/O varicella    childhood  . Headache(784.0)    migraine topomax  . Heart murmur    congential bicuspid aortic valve, mitral valve prolapse  . Herniated disc 2008  . HTN (hypertension)   . IBS (irritable bowel syndrome)   . Infertility, female   . Pelvic pain   . Pneumonia    hx of  . PONV (postoperative nausea and vomiting)    needs  Scop patch  . Postpartum hypertension    not being treated now  . SVT (supraventricular tachycardia) ([place])   . UTI (lower urinary tract infection)   . Yeast infection     Patient Active Problem List   Diagnosis Date Noted  . Near syncope 04/23/2020  . Anxiety 12/26/2018  . GAD (generalized anxiety disorder) 08/12/2018  . HTN (hypertension) 02/21/2014  . SVT (supraventricular tachycardia) ([place]) 02/21/2014  . Postpartum hypertension   . Pelvic pain 11/01/2013  . DEPRESSION 05/30/2008  . Mitral valve disorder 05/30/2008  . ANAL FISSURE 05/30/2008  . BICUSPID AORTIC VALVE 05/30/2008  . DYSPHAGIA UNSPECIFIED 05/30/2008  . MIGRAINE HEADACHE 05/29/2008  . GERD 05/29/2008  . CONSTIPATION 05/29/2008  . IBS 05/29/2008  . EPIGASTRIC PAIN 05/29/2008  . Hx of endometriosis 12/24/2003    Past Surgical History:  Procedure Laterality Date  . ABDOMINAL HYSTERECTOMY    . BUNIONECTOMY  1998  . CESAREAN SECTION  2010  . CHOLECYSTECTOMY N/A 08/27/2019   Procedure: LAPAROSCOPIC CHOLECYSTECTOMY;  Surgeon: Jovita Kussmaul, MD;  Location: [facility];  Service: General;  Laterality: N/A;  . COLONOSCOPY WITH PROPOFOL N/A 08/20/2015   Procedure: COLONOSCOPY WITH PROPOFOL;  Surgeon: Arta Silence, MD;  Location: [facility];  Service: Endoscopy;  Laterality: N/A;  . dermoid cyst  removed   2010  . DILATION AND CURETTAGE OF UTERUS  12/31/10  . ENTEROSCOPY N/A 08/20/2015   Procedure: ENTEROSCOPY;  Surgeon: Arta Silence, MD;  Location: [facility];  Service: Endoscopy;  Laterality: N/A;  . FOOT SURGERY    . herniated disc surgery  2008  . LAPAROSCOPIC ENDOMETRIOSIS FULGURATION    . ROBOTIC ASSISTED TOTAL HYSTERECTOMY Bilateral 11/01/2013   Procedure: ROBOTIC ASSISTED TOTAL HYSTERECTOMY WITH BILATERAL SALPINOGECTOMY;  Surgeon: Alwyn Pea, MD;  Location: [facility];  Service: Gynecology;  Laterality: Bilateral;  . TONSILLECTOMY  1993     OB History    Gravida  3   Para  3   Term  2   Preterm  1    AB      Living  3     SAB      TAB      Ectopic      Multiple      Live Births  3           Family History  Problem Relation Age of Onset  . Diabetes Mother   . Other Father        medical history unknown  . Heart disease Maternal Uncle   . Hypertension Maternal Grandmother   . Diabetes Maternal Grandmother   . Heart disease Maternal Grandmother     Social History   Tobacco Use  . Smoking status: Never Smoker  . Smokeless tobacco: Never Used  Vaping Use  . Vaping Use: Never used  Substance Use Topics  . Alcohol use: Yes    Comment: occasionally  . Drug use: No    Home Medications Prior to Admission medications   Medication Sig Start Date End Date Taking? Authorizing Provider  albuterol (PROVENTIL HFA;VENTOLIN HFA) 108 (90 BASE) MCG/ACT inhaler Inhale 1-2 puffs into the lungs every 6 (six) hours as needed for wheezing or shortness of breath.    [provider]  b complex vitamins capsule Take 1 capsule by mouth daily.    [provider]  BREO ELLIPTA 100-25 MCG/INH AEPB Inhale 1 puff into the lungs daily as needed (shortness of breath).  10/02/18   [provider]  dicyclomine (BENTYL) 10 MG capsule Take 1 capsule (10 mg total) by mouth 3 (three) times daily as needed for spasms. 08/25/20   Zaidee Rion, Wenda Overland, MD  docusate sodium (COLACE) 100 MG capsule Take 100 mg by mouth daily as needed for mild constipation.    [provider]  Fluoxetine HCl, PMDD, 10 MG TABS fluoxetine 10 mg tablet    [provider]  Magnesium (V-R MAGNESIUM) 250 MG TABS Take by mouth.    [provider]  Magnesium 250 MG TABS Take 250 mg by mouth every evening.     [provider]  metoCLOPramide (REGLAN) 10 MG tablet Take 1 tablet (10 mg total) by mouth 3 (three) times daily as needed for nausea or vomiting (headache / nausea). 08/25/20   Bejamin Hackbart, Wenda Overland, MD  omeprazole (PRILOSEC) 20 MG capsule Take 20 mg by mouth  daily.     [provider]  ondansetron (ZOFRAN ODT) 4 MG disintegrating tablet Take 1 tablet (4 mg total) by mouth every 8 (eight) hours as needed for nausea or vomiting. 05/24/19   Quintella Reichert, MD  Probiotic Product (ALIGN) 4 MG CAPS Take 4 mg by mouth daily.    [provider]  rizatriptan (MAXALT) 10 MG tablet Take 10 mg by mouth as needed for migraine.  May repeat in 2 hours if needed    [provider]  spironolactone (ALDACTONE) 25 MG tablet Take 25 mg by mouth daily.    [provider]  Topiramate ER 150 MG CS24 Take 150 mg by mouth at bedtime.     [provider]  venlafaxine XR (EFFEXOR-XR) 37.5 MG 24 hr capsule TAKE 3 CAPSULES BY MOUTH EVERY DAY 07/21/20   Kathrynn Ducking, MD    Allergies    Ciprofloxacin, Penicillins, Paxil [paroxetine hcl], Amoxicillin, Codeine, Fluoxetine, Guaifenesin & derivatives, Hydrocodone-acetaminophen, Iodine, Lactose intolerance (gi), Lactulose, and Percocet [oxycodone-acetaminophen]  Review of Systems   Review of Systems  Gastrointestinal: Positive for abdominal pain.  All other systems reviewed and are negative except that which was mentioned in HPI   Physical Exam Updated Vital Signs BP 110/72 (BP Location: Right Arm)   Pulse 79   Temp 98.6 F (37 C) (Oral)   Resp 18   Ht 5\' 4"  (1.626 m)   Wt 63.5 kg   LMP 02/13/2012 (Exact Date)   SpO2 99%   BMI 24.03 kg/m   Physical Exam Vitals and nursing note reviewed.  Constitutional:      General: She is not in acute distress.    Appearance: She is well-developed.     Comments: uncomfortable  HENT:     Head: Normocephalic and atraumatic.  Eyes:     Conjunctiva/sclera: Conjunctivae normal.  Cardiovascular:     Rate and Rhythm: Normal rate and regular rhythm.     Heart sounds: Normal heart sounds. No murmur heard.   Pulmonary:     Effort: Pulmonary effort is normal.     Breath sounds: Normal breath sounds.  Abdominal:     General: Bowel  sounds are normal. There is no distension.     Palpations: Abdomen is soft.     Tenderness: There is abdominal tenderness in the epigastric area. There is no guarding or rebound.  Musculoskeletal:     Cervical back: Neck supple.  Skin:    General: Skin is warm and dry.  Neurological:     Mental Status: She is alert and oriented to person, place, and time.     Comments: Fluent speech  Psychiatric:        Judgment: Judgment normal.     ED Results / Procedures / Treatments   Labs (all labs ordered are listed, but only abnormal results are displayed) Labs Reviewed  URINALYSIS, ROUTINE W REFLEX MICROSCOPIC - Abnormal; Notable for the following components:      Result Value   APPearance CLOUDY (*)    Specific Gravity, Urine >1.030 (*)    Hgb urine dipstick SMALL (*)    Bilirubin Urine SMALL (*)    Ketones, ur >80 (*)    All other components within normal limits  CBC WITH DIFFERENTIAL/PLATELET - Abnormal; Notable for the following components:   WBC 13.2 (*)    RBC 5.33 (*)    Hemoglobin 15.5 (*)    HCT 46.9 (*)    Neutro Abs 10.1 (*)    All other components within normal limits  COMPREHENSIVE METABOLIC PANEL - Abnormal; Notable for the following components:   Glucose, Bld 117 (*)    All other components within normal limits  URINALYSIS, MICROSCOPIC (REFLEX) - Abnormal; Notable for the following components:   Bacteria, UA MANY (*)    All other components within normal limits  URINE CULTURE  PREGNANCY, URINE  LIPASE, BLOOD    EKG None  Radiology No results  found.  Procedures Procedures (including critical care time)  Medications Ordered in ED Medications  metoCLOPramide (REGLAN) injection 10 mg (10 mg Intravenous Given 08/25/20 2107)  lactated ringers bolus 1,000 mL ( Intravenous Stopped 08/25/20 2210)  dicyclomine (BENTYL) capsule 10 mg (10 mg Oral Given 08/25/20 2131)    ED Course  I have reviewed the triage vital signs and the nursing notes.  Pertinent labs that  were available during my care of the patient were reviewed by me and considered in my medical decision making (see chart for details).    MDM Rules/Calculators/A&P                          Nontoxic on exam, normal vital signs, epigastric tenderness noted.  Lab work shows normal LFTs, creatinine, and lipase.  Normal CBC.  UA suggestive of mild dehydration.  Gave fluid bolus, Reglan, and Bentyl.  On reassessment, patient stated that her symptoms had improved and was able to tolerate ice chips and later ginger ale.  Provided with medications to use as needed at home.  Reviewed return precautions including worsening pain, intractable vomiting, or fever.  She voiced understanding. Final Clinical Impression(s) / ED Diagnoses Final diagnoses:  Epigastric pain  Non-intractable vomiting with nausea, unspecified vomiting type    Rx / DC Orders ED Discharge Orders         Ordered    metoCLOPramide (REGLAN) 10 MG tablet  3 times daily PRN        08/25/20 2318    dicyclomine (BENTYL) 10 MG capsule  3 times daily PRN        08/25/20 2318           Shreyas Piatkowski, Wenda Overland, MD 08/25/20 2318

## 2020-08-25 NOTE — ED Notes (Signed)
ED Provider at bedside. 

## 2020-08-25 NOTE — ED Triage Notes (Signed)
C/o abd pain, n/v x 3 days

## 2020-08-27 LAB — URINE CULTURE: Culture: <10000 — AB

## 2020-12-17 DIAGNOSIS — L700 Acne vulgaris: Secondary | ICD-10-CM | POA: Diagnosis not present

## 2020-12-17 DIAGNOSIS — Z1283 Encounter for screening for malignant neoplasm of skin: Secondary | ICD-10-CM | POA: Diagnosis not present

## 2020-12-17 DIAGNOSIS — L821 Other seborrheic keratosis: Secondary | ICD-10-CM | POA: Diagnosis not present

## 2020-12-25 DIAGNOSIS — G43019 Migraine without aura, intractable, without status migrainosus: Secondary | ICD-10-CM | POA: Diagnosis not present

## 2020-12-25 DIAGNOSIS — G43111 Migraine with aura, intractable, with status migrainosus: Secondary | ICD-10-CM | POA: Diagnosis not present

## 2020-12-25 DIAGNOSIS — G43719 Chronic migraine without aura, intractable, without status migrainosus: Secondary | ICD-10-CM | POA: Diagnosis not present

## 2021-01-27 ENCOUNTER — Telehealth (INDEPENDENT_AMBULATORY_CARE_PROVIDER_SITE_OTHER): Payer: BC Managed Care – PPO | Admitting: Psychiatry

## 2021-01-27 ENCOUNTER — Encounter: Payer: Self-pay | Admitting: Psychiatry

## 2021-01-27 ENCOUNTER — Telehealth: Payer: Self-pay | Admitting: Psychiatry

## 2021-01-27 ENCOUNTER — Other Ambulatory Visit: Payer: Self-pay

## 2021-01-27 DIAGNOSIS — F411 Generalized anxiety disorder: Secondary | ICD-10-CM | POA: Diagnosis not present

## 2021-01-27 MED ORDER — DESVENLAFAXINE SUCCINATE ER 25 MG PO TB24: ORAL_TABLET | ORAL | 0 refills | Status: DC

## 2021-01-27 MED ORDER — DESVENLAFAXINE SUCCINATE ER 50 MG PO TB24: 50.0000 mg | ORAL_TABLET | Freq: Every day | ORAL | 1 refills | Status: DC

## 2021-01-27 MED ORDER — VENLAFAXINE HCL ER 37.5 MG PO CP24: ORAL_CAPSULE | ORAL | 1 refills | Status: DC

## 2021-01-27 NOTE — Progress Notes (Signed)
Madeline Everett [id number] 20-Nov-1976 45 y.o.  Virtual Visit via Video Note  I connected with pt @ on 01/27/21 at 10:30 AM EST by a video enabled telemedicine application and verified that I am speaking with the correct person using two identifiers.   I discussed the limitations of evaluation and management by telemedicine and the availability of in person appointments. The patient expressed understanding and agreed to proceed.  I discussed the assessment and treatment plan with the patient. The patient was provided an opportunity to ask questions and all were answered. The patient agreed with the plan and demonstrated an understanding of the instructions.   The patient was advised to call back or seek an in-person evaluation if the symptoms worsen or if the condition fails to improve as anticipated.  I provided 30 minutes of non-face-to-face time during this encounter.  The patient was located at home.  The provider was located at [place].   Thayer Headings, PMHNP   Subjective:   Patient ID:  Madeline Everett is a 45 y.o. (DOB [birth year]) female.  Chief Complaint:  Chief Complaint  Patient presents with  . Medication Problem    Excessive somnolence  . Follow-up    Depression, Anxiety, insomnia    HPI Madeline Everett presents for follow-up of depression, anxietyShe reports, "I think I am doing ok." She reports that in October 2020 she saw a cardiologist and was referred to neurology to r/o seizure activity and for headache management. She reports that she seemed to be doing "ok" with Buspar and Wellbutrin before visit with neurologist. She reports that she was "less snappy" with Buspar. The neurologist stopped Buspar and she was switched to Prozac. Had HA's with Prozac. She was then switched to Effexor XR and this was increased to 37.5 mg three capsules daily and had severe fatigue and wt gain.   She reports that she has a vasovagal response with big meals and eating  smaller meals. Has occasional episodes of feeling like she might pass out. Denies syncope.   She reports that she has been having difficulty with focus. She reports that anxiety has not been "too bad." Occasional episodes of feeling nervous. She reports that some days she does not want to deal with people. She reports that she has some difficulty with depression and attributes this to some difficulties with 68 yo son. She reports that irritability fluctuates in response to how much sleep she has had, how far behind she is at work or household tasks, etc. She reports difficulty falling asleep and does not stay asleep very long. She reports that she is easily awakened. Estimates sleeping 5-6 hours a night. She reports that appetite is decreased in the morning. She reports early satiety and is eating less now. Energy has been very low. Motivation is low most days. She reports that she will sleep all day on days off. She reports that fatigue improved some with decreasing Effexor XR 37.5 mg from 3 capsules to 2 capsules. Denies SI.   She reports that she is working longer hours.   Had gallbladder remover in 2021.   Reports that Effexor XR has helped raise BP and near syncopal episodes. She reports that "the side effects are affecting my mood and anxiety." She reports that benefits are not outweighing side effects of wt gain and the fatigue. She typically takes Effexor XR in the evening.   Past Psychiatric Medication Trials: Remeron- Nasal congestion Cymbalta- Effective but caused constipation. PVC, SVT  Wellbutrin-  reports that this was started for chronic fatigue Zoloft- adverse effect. Irritability. Affective dulling. Sexual side effects.  Prozac- Effective and then started having HA's with it.  Paxil Lexapro Effexor Trintellix Buspar Trazodone Topamax   Review of Systems:  Review of Systems  Musculoskeletal: Negative for gait problem.  Neurological: Positive for headaches.       Occ near  syncopal episodes  Psychiatric/Behavioral:       Please refer to HPI    Medications: I have reviewed the patient's current medications.  Current Outpatient Medications  Medication Sig Dispense Refill  . albuterol (PROVENTIL HFA;VENTOLIN HFA) 108 (90 BASE) MCG/ACT inhaler Inhale 1-2 puffs into the lungs every 6 (six) hours as needed for wheezing or shortness of breath.    Marland Kitchen b complex vitamins capsule Take 1 capsule by mouth daily.    Marland Kitchen BREO ELLIPTA 100-25 MCG/INH AEPB Inhale 1 puff into the lungs daily as needed (shortness of breath).     Marland Kitchen desvenlafaxine (PRISTIQ) 50 MG 24 hr tablet Take 1 tablet (50 mg total) by mouth daily. 30 tablet 1  . Desvenlafaxine Succinate ER 25 MG TB24 Take 25 mg by mouth in the morning for 7 days, THEN 50 mg in the morning for 23 days. 30 tablet 0  . docusate sodium (COLACE) 100 MG capsule Take 100 mg by mouth daily as needed for mild constipation.    . Magnesium 250 MG TABS Take 250 mg by mouth every evening.     . Magnesium 250 MG TABS Take by mouth.    Marland Kitchen omeprazole (PRILOSEC) 20 MG capsule Take 20 mg by mouth daily.     . Probiotic Product (ALIGN) 4 MG CAPS Take 4 mg by mouth daily.    . rizatriptan (MAXALT) 10 MG tablet Take 10 mg by mouth as needed for migraine. May repeat in 2 hours if needed    . spironolactone (ALDACTONE) 25 MG tablet Take 25 mg by mouth daily.    . Topiramate ER 150 MG CS24 Take 150 mg by mouth at bedtime.     . metoCLOPramide (REGLAN) 10 MG tablet Take 1 tablet (10 mg total) by mouth 3 (three) times daily as needed for nausea or vomiting (headache / nausea). (Patient not taking: Reported on 01/27/2021) 6 tablet 0  . venlafaxine XR (EFFEXOR-XR) 37.5 MG 24 hr capsule Take 1 capsule daily for a week, then discontinuation 270 capsule 1   No current facility-administered medications for this visit.    Medication Side Effects: Fatigue and Other: Wt gain  Allergies:  Allergies  Allergen Reactions  . Ciprofloxacin Swelling    SWELLING  REACTION UNSPECIFIED   . Penicillins Rash and Other (See Comments)    Has patient had a PCN reaction causing immediate rash, facial/tongue/throat swelling, SOB or lightheadedness with hypotension: No Has patient had a PCN reaction causing severe rash involving mucus membranes or skin necrosis: Yes  Has patient had a PCN reaction that required hospitalization No Has patient had a PCN reaction occurring within the last 10 years: Yes    . Paxil [Paroxetine Hcl] Other (See Comments)    UNSPECIFIED REACTION  [Does not remember]  . Amoxicillin Rash  . Codeine Nausea And Vomiting  . Fluoxetine Other (See Comments)    Headaches  . Guaifenesin & Derivatives Nausea And Vomiting    GI  . Hydrocodone-Acetaminophen Nausea Only  . Iodine Rash  . Lactose Intolerance (Gi) Other (See Comments)    GI  . Lactulose Nausea And Vomiting  GI  . Percocet [Oxycodone-Acetaminophen] Nausea And Vomiting    Past Medical History:  Diagnosis Date  . Acid reflux   . Anxiety   . Aortic valve disorder    Bicuspid aortic valve - stable by echo 11/2019  . Asthma   . Atypical chest pain   . Chronic fatigue syndrome   . Complication of anesthesia    nausea & vomiting and difficult time waking up  . Compound nevus 01/2012   dysplastic, melanocytic atypia  . Depression    crying spells for a year due to stress of  infertility  . Deviated septum    Dr Ruby Cola  . Endometriosis   . Family history of adverse reaction to anesthesia    sister-n/v  . FH: thyroid condition   . Gestational hypertension   . H/O candidiasis   . H/O constipation   . H/O dyspareunia   . H/O varicella    childhood  . Headache(784.0)    migraine topomax  . Heart murmur    congential bicuspid aortic valve, mitral valve prolapse  . Herniated disc 2008  . HTN (hypertension)   . IBS (irritable bowel syndrome)   . Infertility, female   . Pelvic pain   . Pneumonia    hx of  . PONV (postoperative nausea and vomiting)    needs  Scop patch  . Postpartum hypertension    not being treated now  . SVT (supraventricular tachycardia) ([place])   . UTI (lower urinary tract infection)   . Yeast infection     Family History  Problem Relation Age of Onset  . Diabetes Mother   . Other Father        medical history unknown  . Heart disease Maternal Uncle   . Hypertension Maternal Grandmother   . Diabetes Maternal Grandmother   . Heart disease Maternal Grandmother     Social History   Socioeconomic History  . Marital status: Married    Spouse name: Not on file  . Number of children: 2  . Years of education: some college  . Highest education level: Not on file  Occupational History  . Occupation: pattern maker  Tobacco Use  . Smoking status: Never Smoker  . Smokeless tobacco: Never Used  Vaping Use  . Vaping Use: Never used  Substance and Sexual Activity  . Alcohol use: Yes    Comment: occasionally  . Drug use: No  . Sexual activity: Yes    Birth control/protection: Surgical  Other Topics Concern  . Not on file  Social History Narrative   Lives at home with her family.   Right-handed.   1.5 cups caffeine per day.   Social Determinants of Health   Financial Resource Strain: Not on file  Food Insecurity: Not on file  Transportation Needs: Not on file  Physical Activity: Not on file  Stress: Not on file  Social Connections: Not on file  Intimate Partner Violence: Not on file    Past Medical History, Surgical history, Social history, and Family history were reviewed and updated as appropriate.   Please see review of systems for further details on the patient's review from today.   Objective:   Physical Exam:  LMP 02/13/2012 (Exact Date)   Physical Exam Neurological:     Mental Status: She is alert and oriented to person, place, and time.     Cranial Nerves: No dysarthria.  Psychiatric:        Attention and Perception: Attention and perception normal.  Mood and Affect: Mood normal.         Speech: Speech normal.        Behavior: Behavior is cooperative.        Thought Content: Thought content normal. Thought content is not paranoid or delusional. Thought content does not include homicidal or suicidal ideation. Thought content does not include homicidal or suicidal plan.        Cognition and Memory: Cognition and memory normal.        Judgment: Judgment normal.     Comments: Insight intact     Lab Review:     Component Value Date/Time   NA 138 08/25/2020 1903   NA 141 10/06/2018 0858   K 4.2 08/25/2020 1903   CL 104 08/25/2020 1903   CO2 24 08/25/2020 1903   GLUCOSE 117 (H) 08/25/2020 1903   BUN 13 08/25/2020 1903   BUN 13 10/06/2018 0858   CREATININE 0.66 08/25/2020 1903   CREATININE 0.85 08/04/2016 0944   CALCIUM 9.0 08/25/2020 1903   PROT 7.3 08/25/2020 1903   ALBUMIN 4.3 08/25/2020 1903   AST 21 08/25/2020 1903   ALT 27 08/25/2020 1903   ALKPHOS 58 08/25/2020 1903   BILITOT 0.5 08/25/2020 1903   GFRNONAA >60 08/25/2020 1903   GFRAA >60 08/25/2020 1903       Component Value Date/Time   WBC 13.2 (H) 08/25/2020 1903   RBC 5.33 (H) 08/25/2020 1903   HGB 15.5 (H) 08/25/2020 1903   HCT 46.9 (H) 08/25/2020 1903   PLT 316 08/25/2020 1903   MCV 88.0 08/25/2020 1903   MCH 29.1 08/25/2020 1903   MCHC 33.0 08/25/2020 1903   RDW 12.5 08/25/2020 1903   LYMPHSABS 2.3 08/25/2020 1903   MONOABS 0.6 08/25/2020 1903   EOSABS 0.0 08/25/2020 1903   BASOSABS 0.0 08/25/2020 1903    No results found for: POCLITH, LITHIUM   No results found for: PHENYTOIN, PHENOBARB, VALPROATE, CBMZ   .res Assessment: Plan:   Pt reports that Effexor has seemed to be helpful for her mood, anxiety, and near syncopal episodes, however she reports that benefits are currently not outweighing side effects of weight gain and excessive somnolence. Discussed potential benefits, risks, and side effects of Pristiq and discussed that it is pharmacologically similar to Effexor with less risk  of side effects. Pt agrees to trial of Pristiq. Discussed cross-titration from Effexor XR to Pristiq to minimize risk of discontinuation s/s and side effects. Will decrease Effexor XR to 37.5 mg po q evening for one week, then stop. Start Pristiq 25 mg po q am for one week, then increase to 50 mg po q am.  Pt to follow-up in 6 weeks or sooner if clinically indicated.  Patient advised to contact office with any questions, adverse effects, or acute worsening in signs and symptoms.  Madeline Everett was seen today for medication problem and follow-up.  Diagnoses and all orders for this visit:  Generalized anxiety disorder -     Desvenlafaxine Succinate ER 25 MG TB24; Take 25 mg by mouth in the morning for 7 days, THEN 50 mg in the morning for 23 days. -     desvenlafaxine (PRISTIQ) 50 MG 24 hr tablet; Take 1 tablet (50 mg total) by mouth daily. -     venlafaxine XR (EFFEXOR-XR) 37.5 MG 24 hr capsule; Take 1 capsule daily for a week, then discontinuation     Please see After Visit Summary for patient specific instructions.  Future Appointments  Date Time Provider  [place]  03/12/2021 11:30 AM Thayer Headings, PMHNP CP-CP None    No orders of the defined types were placed in this encounter.     -------------------------------

## 2021-01-27 NOTE — Telephone Encounter (Signed)
Ms. dazaria, macneill are scheduled for a virtual visit with your provider today.    Just as we do with appointments in the office, we must obtain your consent to participate.  Your consent will be active for this visit and any virtual visit you may have with one of our providers in the next 365 days.    If you have a MyChart account, I can also send a copy of this consent to you electronically.  All virtual visits are billed to your insurance company just like a traditional visit in the office.  As this is a virtual visit, video technology does not allow for your provider to perform a traditional examination.  This may limit your provider's ability to fully assess your condition.  If your provider identifies any concerns that need to be evaluated in person or the need to arrange testing such as labs, EKG, etc, we will make arrangements to do so.    Although advances in technology are sophisticated, we cannot ensure that it will always work on either your end or our end.  If the connection with a video visit is poor, we may have to switch to a telephone visit.  With either a video or telephone visit, we are not always able to ensure that we have a secure connection.   I need to obtain your verbal consent now.   Are you willing to proceed with your visit today?   RIYANA BIEL has provided verbal consent on 01/27/2021 for a virtual visit (video or telephone).   Thayer Headings, PMHNP 01/27/2021  10:42 AM

## 2021-02-25 DIAGNOSIS — Z1231 Encounter for screening mammogram for malignant neoplasm of breast: Secondary | ICD-10-CM | POA: Diagnosis not present

## 2021-03-03 DIAGNOSIS — Z3040 Encounter for surveillance of contraceptives, unspecified: Secondary | ICD-10-CM | POA: Diagnosis not present

## 2021-03-03 DIAGNOSIS — Z01419 Encounter for gynecological examination (general) (routine) without abnormal findings: Secondary | ICD-10-CM | POA: Diagnosis not present

## 2021-03-03 DIAGNOSIS — N951 Menopausal and female climacteric states: Secondary | ICD-10-CM | POA: Diagnosis not present

## 2021-03-03 DIAGNOSIS — Z6826 Body mass index (BMI) 26.0-26.9, adult: Secondary | ICD-10-CM | POA: Diagnosis not present

## 2021-03-03 DIAGNOSIS — Z1231 Encounter for screening mammogram for malignant neoplasm of breast: Secondary | ICD-10-CM | POA: Diagnosis not present

## 2021-03-03 DIAGNOSIS — R635 Abnormal weight gain: Secondary | ICD-10-CM | POA: Diagnosis not present

## 2021-03-09 ENCOUNTER — Other Ambulatory Visit: Payer: Self-pay | Admitting: Psychiatry

## 2021-03-09 DIAGNOSIS — F411 Generalized anxiety disorder: Secondary | ICD-10-CM

## 2021-03-10 DIAGNOSIS — R635 Abnormal weight gain: Secondary | ICD-10-CM | POA: Diagnosis not present

## 2021-03-12 ENCOUNTER — Ambulatory Visit: Payer: BC Managed Care – PPO | Admitting: Psychiatry

## 2021-04-28 ENCOUNTER — Ambulatory Visit: Payer: BC Managed Care – PPO | Admitting: Psychiatry

## 2021-05-29 ENCOUNTER — Other Ambulatory Visit: Payer: Self-pay

## 2021-05-29 ENCOUNTER — Ambulatory Visit: Payer: BC Managed Care – PPO | Admitting: Family

## 2021-05-29 ENCOUNTER — Encounter: Payer: Self-pay | Admitting: Family

## 2021-05-29 VITALS — BP 116/86 | HR 83 | Ht 64.0 in | Wt 150.6 lb

## 2021-05-29 DIAGNOSIS — R002 Palpitations: Secondary | ICD-10-CM

## 2021-05-29 DIAGNOSIS — I10 Essential (primary) hypertension: Secondary | ICD-10-CM | POA: Diagnosis not present

## 2021-05-29 DIAGNOSIS — I471 Supraventricular tachycardia: Secondary | ICD-10-CM

## 2021-05-29 DIAGNOSIS — Q231 Congenital insufficiency of aortic valve: Secondary | ICD-10-CM

## 2021-05-29 MED ORDER — PROPRANOLOL HCL 20 MG PO TABS: 20.0000 mg | ORAL_TABLET | Freq: Three times a day (TID) | ORAL | 5 refills | Status: DC | PRN

## 2021-05-29 NOTE — Progress Notes (Signed)
Office Visit    Patient Name: Madeline Everett Date of Encounter: 05/29/2021  PCP:  Harlan Stains, MD   [place]  Cardiologist:  Fransico Him, MD  Advanced Practice Provider:  No care team member to display Electrophysiologist:  None   Chief Complaint    Madeline Everett is a 45 y.o. female with a hx of hypertension, SVT, bicuspid aortic valve presents today for follow-up of hypertension and bicuspid aortic valve  Past Medical History    Past Medical History:  Diagnosis Date   Acid reflux    Anxiety    Aortic valve disorder    Bicuspid aortic valve - stable by echo 11/2019   Asthma    Atypical chest pain    Chronic fatigue syndrome    Complication of anesthesia    nausea & vomiting and difficult time waking up   Compound nevus 01/2012   dysplastic, melanocytic atypia   Depression    crying spells for a year due to stress of  infertility   Deviated septum    Dr Ruby Cola   Endometriosis    Family history of adverse reaction to anesthesia    sister-n/v   FH: thyroid condition    Gestational hypertension    H/O candidiasis    H/O constipation    H/O dyspareunia    H/O varicella    childhood   Headache(784.0)    migraine topomax   Heart murmur    congential bicuspid aortic valve, mitral valve prolapse   Herniated disc 2008   HTN (hypertension)    IBS (irritable bowel syndrome)    Infertility, female    Pelvic pain    Pneumonia    hx of   PONV (postoperative nausea and vomiting)    needs Scop patch   Postpartum hypertension    not being treated now   SVT (supraventricular tachycardia) ([place])    UTI (lower urinary tract infection)    Yeast infection    Past Surgical History:  Procedure Laterality Date   ABDOMINAL HYSTERECTOMY     BUNIONECTOMY  1998   CESAREAN SECTION  2010   CHOLECYSTECTOMY N/A 08/27/2019   Procedure: LAPAROSCOPIC CHOLECYSTECTOMY;  Surgeon: Jovita Kussmaul, MD;  Location: [facility];  Service: General;  Laterality:  N/A;   COLONOSCOPY WITH PROPOFOL N/A 08/20/2015   Procedure: COLONOSCOPY WITH PROPOFOL;  Surgeon: Arta Silence, MD;  Location: [facility];  Service: Endoscopy;  Laterality: N/A;   dermoid cyst  removed   2010   DILATION AND CURETTAGE OF UTERUS  12/31/10   ENTEROSCOPY N/A 08/20/2015   Procedure: ENTEROSCOPY;  Surgeon: Arta Silence, MD;  Location: [facility];  Service: Endoscopy;  Laterality: N/A;   FOOT SURGERY     herniated disc surgery  2008   LAPAROSCOPIC ENDOMETRIOSIS FULGURATION     ROBOTIC ASSISTED TOTAL HYSTERECTOMY Bilateral 11/01/2013   Procedure: ROBOTIC ASSISTED TOTAL HYSTERECTOMY WITH BILATERAL SALPINOGECTOMY;  Surgeon: Alwyn Pea, MD;  Location: [facility];  Service: Gynecology;  Laterality: Bilateral;   TONSILLECTOMY  1993    Allergies  Allergies  Allergen Reactions   Ciprofloxacin Swelling    SWELLING REACTION UNSPECIFIED    Penicillins Rash and Other (See Comments)    Has patient had a PCN reaction causing immediate rash, facial/tongue/throat swelling, SOB or lightheadedness with hypotension: No Has patient had a PCN reaction causing severe rash involving mucus membranes or skin necrosis: Yes  Has patient had a PCN reaction that required hospitalization No Has patient  had a PCN reaction occurring within the last 10 years: Yes     Paxil [Paroxetine Hcl] Other (See Comments)    UNSPECIFIED REACTION  [Does not remember]   Amoxicillin Rash   Codeine Nausea And Vomiting   Fluoxetine Other (See Comments)    Headaches   Guaifenesin & Derivatives Nausea And Vomiting    GI   Hydrocodone-Acetaminophen Nausea Only   Iodine Rash   Lactose Intolerance (Gi) Other (See Comments)    GI   Lactulose Nausea And Vomiting    GI   Percocet [Oxycodone-Acetaminophen] Nausea And Vomiting    History of Present Illness    Madeline Everett is a 45 y.o. female with a hx of hypertension, SVT, bicuspid aortic valve last seen 12/11/2019 by Dr. Radford Pax.  She was last seen 12/11/2019 by  Dr. Radford Pax and noted intermittent palpitations.  She was recommended for 30-day monitor as well as echo.  She did not spells were all of a sudden she could not control her body and was referred to neurology.'  Echocardiogram 12/24/2019 LVEF 55%, no R WMA, normal LV size, normal diastolic parameters, RV normal size and function, trivial MR, bicuspid aortic valve with no regurgitation nor stenosis.  30-day monitor showed normal sinus rhythm and sinus tachycardia with average heart rate 91 bpm and range from 60 bpm to 153 bpm.  No significant arrhythmia in the past.  Dr. Jannifer Franklin diagnosed her with vasovagal spells and has been adjusting her anxiety medications.   She presents today for follow up. Notes her vasovagal episodes are most notable with large meals or high sugar intake. She is staying well hydrated, limiting caffeine.  Her vasovagal episodes are overall well controlled since preventative measures.  Notes palpitations. Most often triggered by anxiety or stress.  Tells me they make her feel worn out for a few days. Reports no shortness of breath nor dyspnea on exertion. Reports no chest pain, pressure, or tightness. No edema, orthopnea, PND.  Denies near-syncope or syncope.  EKGs/Labs/Other Studies Reviewed:   The following studies were reviewed today:  Echo 12/24/19  1. Left ventricular ejection fraction, by visual estimation, is 55%. The  left ventricle has normal function. There is no left ventricular  hypertrophy. GLS -18.4%, normal range. Normal diastolic function.   2. The left ventricle has no regional wall motion abnormalities.   3. Global right ventricle has normal systolic function.The right  ventricular size is normal. No increase in right ventricular wall  thickness.   4. Left atrial size was normal.   5. Right atrial size was normal.   6. The mitral valve is normal in structure. Trivial mitral valve  regurgitation. No evidence of mitral stenosis.   7. The aortic valve is  bicuspid. Aortic valve regurgitation is not  visualized. No evidence of aortic valve sclerosis or stenosis.   8. The inferior vena cava is normal in size with greater than 50%  respiratory variability, suggesting right atrial pressure of 3 mmHg.   9. The tricuspid valve is normal in structure. Tricuspid valve  regurgitation is not demonstrated.  10. TR signal is inadequate for assessing pulmonary artery systolic  pressure.   30 day monitor 01/18/20 Normal sinus rhythm and sinus tachycardia. The average heart rate was 91bpm and ranged from 68 to 153bpm   EKG:  EKG is ordered today.  The ekg ordered today demonstrates NSR 83 bpm with no acute ST/T wave changes.   Recent Labs: 08/25/2020: ALT 27; BUN 13; Creatinine, Ser 0.66; Hemoglobin  15.5; Platelets 316; Potassium 4.2; Sodium 138  Recent Lipid Panel No results found for: CHOL, TRIG, HDL, CHOLHDL, VLDL, LDLCALC, LDLDIRECT  Home Medications   Current Meds  Medication Sig   albuterol (PROVENTIL HFA;VENTOLIN HFA) 108 (90 BASE) MCG/ACT inhaler Inhale 1-2 puffs into the lungs every 6 (six) hours as needed for wheezing or shortness of breath.   b complex vitamins capsule Take 1 capsule by mouth daily.   BREO ELLIPTA 100-25 MCG/INH AEPB Inhale 1 puff into the lungs daily as needed (shortness of breath).    desvenlafaxine (PRISTIQ) 50 MG 24 hr tablet TAKE 1 TABLET BY MOUTH EVERY DAY   docusate sodium (COLACE) 100 MG capsule Take 100 mg by mouth daily as needed for mild constipation.   Magnesium 250 MG TABS Take 250 mg by mouth every evening.    metoCLOPramide (REGLAN) 10 MG tablet Take 1 tablet (10 mg total) by mouth 3 (three) times daily as needed for nausea or vomiting (headache / nausea).   omeprazole (PRILOSEC) 20 MG capsule Take 20 mg by mouth daily.    Probiotic Product (ALIGN) 4 MG CAPS Take 4 mg by mouth daily.   rizatriptan (MAXALT) 10 MG tablet Take 10 mg by mouth as needed for migraine. May repeat in 2 hours if needed    spironolactone (ALDACTONE) 25 MG tablet Take 25 mg by mouth daily.   Topiramate ER 150 MG CS24 Take 150 mg by mouth at bedtime.      Review of Systems   All other systems reviewed and are otherwise negative except as noted above.  Physical Exam    VS:  BP 116/86   Pulse 83   Ht 5\' 4"  (1.626 m)   Wt 150 lb 9.6 oz (68.3 kg)   LMP 02/13/2012 (Exact Date)   SpO2 99%   BMI 25.85 kg/m  , BMI Body mass index is 25.85 kg/m.  Wt Readings from Last 3 Encounters:  05/29/21 150 lb 9.6 oz (68.3 kg)  08/25/20 140 lb (63.5 kg)  04/23/20 137 lb (62.1 kg)     GEN: Well nourished, well developed, in no acute distress. HEENT: normal. Neck: Supple, no JVD, carotid bruits, or masses. Cardiac: RRR, no murmurs, rubs, or gallops. No clubbing, cyanosis, edema.  Radials/DP/PT 2+ and equal bilaterally.  Respiratory:  Respirations regular and unlabored, clear to auscultation bilaterally. GI: Soft, nontender, nondistended. MS: No deformity or atrophy. Skin: Warm and dry, no rash. Neuro:  Strength and sensation are intact. Psych: Normal affect.  Assessment & Plan    Bicuspid aortic valve-no murmur appreciated on exam today.  Plan for echocardiogram 11/2021 for continued monitoring as last echo 11/2019.   Hypertension- BP well controlled. Not requiring antihypertensive agent at this time.   SVT/ Palpitations- Reports frequent palpitations. Start Propranolol 20mg  as needed for palpitations. Continue to stay well hydrated, manage stress well, avoid caffeine. Anticipate etiology PVC at this time.   Disposition: Follow up in 1 year(s) with Dr. Radford Pax or sooner as needed.  Signed, Loel Dubonnet, NP 05/29/2021, 2:12 PM [place]

## 2021-05-29 NOTE — Patient Instructions (Addendum)
Medication Instructions:  Your physician has recommended you make the following change in your medication:  START Propranolol one 20mg  tablet as needed three times per day for palpitations  *If you need a refill on your cardiac medications before your next appointment, please call your pharmacy*   Lab Work: None ordered today.  Testing/Procedures: Your EKG today showed normal sinus rhythm which is a great result!  Your physician has requested that you have an echocardiogram January 2023. Echocardiography is a painless test that uses sound waves to create images of your heart. It provides your doctor with information about the size and shape of your heart and how well your heart's chambers and valves are working. This procedure takes approximately one hour. There are no restrictions for this procedure.   Follow-Up: At [facility], you and your health needs are our priority.  As part of our continuing mission to provide you with exceptional heart care, we have created designated Provider Care Teams.  These Care Teams include your primary Cardiologist (physician) and Advanced Practice Providers (APPs -  Physician Assistants and Nurse Practitioners) who all work together to provide you with the care you need, when you need it.  We recommend signing up for the patient portal called "MyChart".  Sign up information is provided on this After Visit Summary.  MyChart is used to connect with patients for Virtual Visits (Telemedicine).  Patients are able to view lab/test results, encounter notes, upcoming appointments, etc.  Non-urgent messages can be sent to your provider as well.   To learn more about what you can do with MyChart, go to [email or web address].    Your next appointment:   1 year in person with Dr. Radford Pax  Other Instructions  Heart Healthy Diet Recommendations: A low-salt diet is recommended. Meats should be grilled, baked, or boiled. Avoid fried foods. Focus on lean protein  sources like fish or chicken with vegetables and fruits. The American Heart Association is a Microbiologist!  American Heart Association Diet and Lifeystyle Recommendations   Exercise recommendations: The American Heart Association recommends 150 minutes of moderate intensity exercise weekly. Try 30 minutes of moderate intensity exercise 4-5 times per week. This could include walking, jogging, or swimming.

## 2021-06-17 ENCOUNTER — Ambulatory Visit: Payer: BC Managed Care – PPO | Admitting: Psychiatry

## 2021-06-17 ENCOUNTER — Encounter: Payer: Self-pay | Admitting: Psychiatry

## 2021-06-17 ENCOUNTER — Other Ambulatory Visit: Payer: Self-pay

## 2021-06-17 DIAGNOSIS — F411 Generalized anxiety disorder: Secondary | ICD-10-CM | POA: Diagnosis not present

## 2021-06-17 MED ORDER — DESVENLAFAXINE SUCCINATE ER 50 MG PO TB24: 50.0000 mg | ORAL_TABLET | Freq: Every day | ORAL | 1 refills | Status: DC

## 2021-06-17 NOTE — Progress Notes (Signed)
Madeline Everett [id number] [birth year] 45 y.o.  Subjective:   Patient ID:  Madeline Everett is a 45 y.o. (DOB 01-14-1976) female.  Chief Complaint:  Chief Complaint  Patient presents with   Follow-up    Anxiety, sleep disturbance     HPI Madeline Everett presents to the office today for follow-up of anxiety and sleep disturbance. She reports that Pristiq seems to be helpful and denies side effects. She reports that she has not been as tired. She has not had any near syncopal episodes. She reports that she has had some slight narrowing of vision. She reports that obgyn told her fatigue and night sweats could also be related to peri-menopause. She reports that hot flashes are now different than what she had with Effexor.   Denies depressed mood. She reports "the better I am feeling digestive-wise the better I feel mood-wise." She reports frustration when dealing with health issues. Energy and motivation have been ok as long as she feels well physically. Anxiety has been "alright" and will occasionally notice it and will try to redirect anxious thoughts. Anxiety will occasionally precipitate palpitations. Denies any panic attacks since her early 20's. Reports that "pristiq has taken the edge off."  Sleep has improved. She has started closing the door to her bedroom and this has helped with sleep. Appetite has been good. She reports that she has cravings for some foods. She reports that she started [place]. She stopped drinking soda. She has had some intentional weight loss. She reports concentration has been difficult at times. She reports that she has "energy spurts" throughout her lifetime and is learning how to channel this. Denies SI.     Past Psychiatric Medication Trials: Remeron- Nasal congestion Cymbalta- Effective but caused constipation. PVC, SVT  Wellbutrin- reports that this was started for chronic fatigue Zoloft- adverse effect. Irritability. Affective dulling. Sexual side  effects. Prozac- Effective and then started having HA's with it.  Paxil Lexapro Effexor Pristiq Trintellix Buspar Trazodone Topamax Propranolol  Review of Systems:  Review of Systems  Cardiovascular:  Positive for palpitations.  Musculoskeletal:  Negative for gait problem.  Neurological:  Negative for tremors.       Has had increased migraines.   Psychiatric/Behavioral:         Please refer to HPI   Medications: I have reviewed the patient's current medications.  Current Outpatient Medications  Medication Sig Dispense Refill   albuterol (PROVENTIL HFA;VENTOLIN HFA) 108 (90 BASE) MCG/ACT inhaler Inhale 1-2 puffs into the lungs every 6 (six) hours as needed for wheezing or shortness of breath.     b complex vitamins capsule Take 1 capsule by mouth daily.     BREO ELLIPTA 100-25 MCG/INH AEPB Inhale 1 puff into the lungs daily as needed (shortness of breath).      cholecalciferol (VITAMIN D3) 25 MCG (1000 UNIT) tablet Take 1,000 Units by mouth daily.     docusate sodium (COLACE) 100 MG capsule Take 100 mg by mouth daily as needed for mild constipation.     Magnesium 250 MG TABS Take 250 mg by mouth every evening.      omeprazole (PRILOSEC) 20 MG capsule Take 20 mg by mouth daily.      Probiotic Product (ALIGN) 4 MG CAPS Take 4 mg by mouth daily.     propranolol (INDERAL) 20 MG tablet Take 1 tablet (20 mg total) by mouth 3 (three) times daily as needed (Take as needed for palpitations). 30 tablet 5   rizatriptan (MAXALT)  10 MG tablet Take 10 mg by mouth as needed for migraine. May repeat in 2 hours if needed     spironolactone (ALDACTONE) 25 MG tablet Take 25 mg by mouth daily.     Topiramate ER 150 MG CS24 Take 150 mg by mouth at bedtime.      desvenlafaxine (PRISTIQ) 50 MG 24 hr tablet Take 1 tablet (50 mg total) by mouth daily. 90 tablet 1   metoCLOPramide (REGLAN) 10 MG tablet Take 1 tablet (10 mg total) by mouth 3 (three) times daily as needed for nausea or vomiting (headache /  nausea). (Patient not taking: Reported on 06/17/2021) 6 tablet 0   No current facility-administered medications for this visit.    Medication Side Effects: None  Allergies:  Allergies  Allergen Reactions   Ciprofloxacin Swelling    SWELLING REACTION UNSPECIFIED    Penicillins Rash and Other (See Comments)    Has patient had a PCN reaction causing immediate rash, facial/tongue/throat swelling, SOB or lightheadedness with hypotension: No Has patient had a PCN reaction causing severe rash involving mucus membranes or skin necrosis: Yes  Has patient had a PCN reaction that required hospitalization No Has patient had a PCN reaction occurring within the last 10 years: Yes     Paxil [Paroxetine Hcl] Other (See Comments)    UNSPECIFIED REACTION  [Does not remember]   Amoxicillin Rash   Codeine Nausea And Vomiting   Fluoxetine Other (See Comments)    Headaches   Guaifenesin & Derivatives Nausea And Vomiting    GI   Hydrocodone-Acetaminophen Nausea Only   Iodine Rash   Lactose Intolerance (Gi) Other (See Comments)    GI   Lactulose Nausea And Vomiting    GI   Percocet [Oxycodone-Acetaminophen] Nausea And Vomiting    Past Medical History:  Diagnosis Date   Acid reflux    Anxiety    Aortic valve disorder    Bicuspid aortic valve - stable by echo 11/2019   Asthma    Atypical chest pain    Chronic fatigue syndrome    Complication of anesthesia    nausea & vomiting and difficult time waking up   Compound nevus 01/2012   dysplastic, melanocytic atypia   Depression    crying spells for a year due to stress of  infertility   Deviated septum    Dr Ruby Cola   Endometriosis    Family history of adverse reaction to anesthesia    sister-n/v   FH: thyroid condition    Gestational hypertension    H/O candidiasis    H/O constipation    H/O dyspareunia    H/O varicella    childhood   Headache(784.0)    migraine topomax   Heart murmur    congential bicuspid aortic valve, mitral  valve prolapse   Herniated disc 2008   HTN (hypertension)    IBS (irritable bowel syndrome)    Infertility, female    Pelvic pain    Pneumonia    hx of   PONV (postoperative nausea and vomiting)    needs Scop patch   Postpartum hypertension    not being treated now   SVT (supraventricular tachycardia) (HCC)    UTI (lower urinary tract infection)    Yeast infection     Past Medical History, Surgical history, Social history, and Family history were reviewed and updated as appropriate.   Please see review of systems for further details on the patient's review from today.   Objective:   Physical  Exam:  BP 121/83   Pulse 76   LMP 02/13/2012 (Exact Date)   Physical Exam Constitutional:      General: She is not in acute distress. Musculoskeletal:        General: No deformity.  Neurological:     Mental Status: She is alert and oriented to person, place, and time.     Coordination: Coordination normal.  Psychiatric:        Attention and Perception: Attention and perception normal. She does not perceive auditory or visual hallucinations.        Mood and Affect: Mood normal. Mood is not anxious or depressed. Affect is not labile, blunt, angry or inappropriate.        Speech: Speech normal.        Behavior: Behavior normal.        Thought Content: Thought content normal. Thought content is not paranoid or delusional. Thought content does not include homicidal or suicidal ideation. Thought content does not include homicidal or suicidal plan.        Cognition and Memory: Cognition and memory normal.        Judgment: Judgment normal.     Comments: Insight intact    Lab Review:     Component Value Date/Time   NA 138 08/25/2020 1903   NA 141 10/06/2018 0858   K 4.2 08/25/2020 1903   CL 104 08/25/2020 1903   CO2 24 08/25/2020 1903   GLUCOSE 117 (H) 08/25/2020 1903   BUN 13 08/25/2020 1903   BUN 13 10/06/2018 0858   CREATININE 0.66 08/25/2020 1903   CREATININE 0.85 08/04/2016  0944   CALCIUM 9.0 08/25/2020 1903   PROT 7.3 08/25/2020 1903   ALBUMIN 4.3 08/25/2020 1903   AST 21 08/25/2020 1903   ALT 27 08/25/2020 1903   ALKPHOS 58 08/25/2020 1903   BILITOT 0.5 08/25/2020 1903   GFRNONAA >60 08/25/2020 1903   GFRAA >60 08/25/2020 1903       Component Value Date/Time   WBC 13.2 (H) 08/25/2020 1903   RBC 5.33 (H) 08/25/2020 1903   HGB 15.5 (H) 08/25/2020 1903   HCT 46.9 (H) 08/25/2020 1903   PLT 316 08/25/2020 1903   MCV 88.0 08/25/2020 1903   MCH 29.1 08/25/2020 1903   MCHC 33.0 08/25/2020 1903   RDW 12.5 08/25/2020 1903   LYMPHSABS 2.3 08/25/2020 1903   MONOABS 0.6 08/25/2020 1903   EOSABS 0.0 08/25/2020 1903   BASOSABS 0.0 08/25/2020 1903    No results found for: POCLITH, LITHIUM   No results found for: PHENYTOIN, PHENOBARB, VALPROATE, CBMZ   .res Assessment: Plan:    Patient seen for 30 minutes and time spent reviewing current medications and discussing treatment plan.  Discussed continuing Pristiq 50 mg daily since this has been helpful for her mood, fatigue, insomnia, and she reports feeling calmer.  She reports that cardiology prescribed propranolol as needed for palpitations and that she has not yet taken propranolol.  Discussed that propranolol is also used off label for anxiety and may be helpful for physical signs and symptoms that she experiences with increased stress.  Reviewed potential benefits, risks, and side effects of propranolol as well as duration of action and that she could initially try taking half of a tablet.  Patient reports that she will consider trying propranolol as needed for anxiety palpitations. Patient to follow-up with this provider in 6 months or sooner if clinically indicated. Patient advised to contact office with any questions, adverse effects, or acute  worsening in signs and symptoms.   Madeline Everett was seen today for follow-up.  Diagnoses and all orders for this visit:  Generalized anxiety disorder -      desvenlafaxine (PRISTIQ) 50 MG 24 hr tablet; Take 1 tablet (50 mg total) by mouth daily.    Please see After Visit Summary for patient specific instructions.  Future Appointments  Date Time Provider [place]  12/08/2021  9:20 AM [facility] ECHO 2 [id number]  12/18/2021  9:00 AM Thayer Headings, PMHNP [facility] None    No orders of the defined types were placed in this encounter.   -------------------------------

## 2021-07-14 DIAGNOSIS — R0981 Nasal congestion: Secondary | ICD-10-CM | POA: Diagnosis not present

## 2021-07-14 DIAGNOSIS — Z20828 Contact with and (suspected) exposure to other viral communicable diseases: Secondary | ICD-10-CM | POA: Diagnosis not present

## 2021-07-14 DIAGNOSIS — J0100 Acute maxillary sinusitis, unspecified: Secondary | ICD-10-CM | POA: Diagnosis not present

## 2021-07-14 DIAGNOSIS — R519 Headache, unspecified: Secondary | ICD-10-CM | POA: Diagnosis not present

## 2021-07-17 DIAGNOSIS — G43019 Migraine without aura, intractable, without status migrainosus: Secondary | ICD-10-CM | POA: Diagnosis not present

## 2021-07-17 DIAGNOSIS — G43111 Migraine with aura, intractable, with status migrainosus: Secondary | ICD-10-CM | POA: Diagnosis not present

## 2021-07-17 DIAGNOSIS — G43719 Chronic migraine without aura, intractable, without status migrainosus: Secondary | ICD-10-CM | POA: Diagnosis not present

## 2021-09-24 DIAGNOSIS — K639 Disease of intestine, unspecified: Secondary | ICD-10-CM | POA: Diagnosis not present

## 2021-09-24 DIAGNOSIS — K6289 Other specified diseases of anus and rectum: Secondary | ICD-10-CM | POA: Diagnosis not present

## 2021-11-24 DIAGNOSIS — J019 Acute sinusitis, unspecified: Secondary | ICD-10-CM | POA: Diagnosis not present

## 2021-11-24 DIAGNOSIS — R059 Cough, unspecified: Secondary | ICD-10-CM | POA: Diagnosis not present

## 2021-11-24 DIAGNOSIS — Z8709 Personal history of other diseases of the respiratory system: Secondary | ICD-10-CM | POA: Diagnosis not present

## 2021-11-24 DIAGNOSIS — J209 Acute bronchitis, unspecified: Secondary | ICD-10-CM | POA: Diagnosis not present

## 2021-12-08 ENCOUNTER — Other Ambulatory Visit: Payer: Self-pay

## 2021-12-08 ENCOUNTER — Ambulatory Visit (HOSPITAL_COMMUNITY): Payer: BC Managed Care – PPO | Attending: Cardiology

## 2021-12-08 DIAGNOSIS — Q231 Congenital insufficiency of aortic valve: Secondary | ICD-10-CM | POA: Diagnosis not present

## 2021-12-08 LAB — ECHOCARDIOGRAM COMPLETE
Area-P 1/2: 4.68 cm2
S' Lateral: 3.2 cm

## 2021-12-09 ENCOUNTER — Encounter (HOSPITAL_BASED_OUTPATIENT_CLINIC_OR_DEPARTMENT_OTHER): Payer: Self-pay

## 2021-12-18 ENCOUNTER — Ambulatory Visit: Payer: BC Managed Care – PPO | Admitting: Psychiatry

## 2021-12-21 ENCOUNTER — Ambulatory Visit: Payer: BC Managed Care – PPO | Admitting: Psychiatry

## 2022-01-04 DIAGNOSIS — L821 Other seborrheic keratosis: Secondary | ICD-10-CM | POA: Diagnosis not present

## 2022-01-04 DIAGNOSIS — L718 Other rosacea: Secondary | ICD-10-CM | POA: Diagnosis not present

## 2022-01-04 DIAGNOSIS — Z1283 Encounter for screening for malignant neoplasm of skin: Secondary | ICD-10-CM | POA: Diagnosis not present

## 2022-01-12 DIAGNOSIS — J069 Acute upper respiratory infection, unspecified: Secondary | ICD-10-CM | POA: Diagnosis not present

## 2022-01-12 DIAGNOSIS — R059 Cough, unspecified: Secondary | ICD-10-CM | POA: Diagnosis not present

## 2022-01-18 ENCOUNTER — Telehealth (INDEPENDENT_AMBULATORY_CARE_PROVIDER_SITE_OTHER): Payer: BC Managed Care – PPO | Admitting: Psychiatry

## 2022-01-18 ENCOUNTER — Encounter: Payer: Self-pay | Admitting: Psychiatry

## 2022-01-18 DIAGNOSIS — F411 Generalized anxiety disorder: Secondary | ICD-10-CM

## 2022-01-18 MED ORDER — BUPROPION HCL ER (SR) 150 MG PO TB12: 150.0000 mg | ORAL_TABLET | Freq: Every day | ORAL | 1 refills | Status: DC

## 2022-01-18 MED ORDER — DESVENLAFAXINE SUCCINATE ER 50 MG PO TB24: 50.0000 mg | ORAL_TABLET | Freq: Every day | ORAL | 1 refills | Status: DC

## 2022-01-18 NOTE — Progress Notes (Signed)
Madeline Everett [id number] [birth year] 46 y.o.  Virtual Visit via Video Note  I connected with pt @ on 01/18/22 at  9:30 AM EST by a video enabled telemedicine application and verified that I am speaking with the correct person using two identifiers.   I discussed the limitations of evaluation and management by telemedicine and the availability of in person appointments. The patient expressed understanding and agreed to proceed.  I discussed the assessment and treatment plan with the patient. The patient was provided an opportunity to ask questions and all were answered. The patient agreed with the plan and demonstrated an understanding of the instructions.   The patient was advised to call back or seek an in-person evaluation if the symptoms worsen or if the condition fails to improve as anticipated.  I provided 30 minutes of non-face-to-face time during this encounter.  The patient was located at home.  The provider was located at home.  Thayer Headings, PMHNP   Subjective:   Patient ID:  Madeline Everett is a 46 y.o. (DOB Aug 21, 1976) female.  Chief Complaint:  Chief Complaint  Patient presents with   Other    Impaired concentration   Follow-up    Depression, anxiety    HPI Madeline Everett presents for follow-up of depression, anxiety, and sleep disturbance. She reports, "I've been doing ok" other than flu in December and recent flu. She reports that her anxiety has been "ok." She notices some increased HR. She reports that Propranolol has been helpful for increased HR and sometimes notices after she takes it that she is calmer, even if she did not realize she was anxious. She reports occasional panic attacks. She reports that this seems to be related to work stress and increased responsibilities. She notices worry and anxious thoughts. She reports that her mood has been "not too bad." Notices her mood is lower on Sunday and she has some dread about returning to work on Mondays. She  reports that her mood is typically a little lower in the winter compared to the summer. She reports that her energy has been better since she stopped taking omeprazole. Motivation has been "pretty good." Sleeping ok. Appetite has been ok. Denies SI.   She reports difficulty with concentration. She reports that she is easily distracted at work and has to wear headphones at work. She reports that she has difficulty remembering where she left off at work. She reports that she has to keep a written list of tasks to complete and a check-off sheet. She reports that concentration has been worse with increased responsibilities. She reports long-standing difficulty with concentration. She reports that in school she would doodle or write what teacher was saying verbatim. She reports that she started having increased difficulty keeping up with objects after having children. She reports that she has some difficulty keeping up with appointments if she does not immediately put them down on her calendar. She reports long-standing procrastination. Some increased difficulty with tasks that are not as interesting or more tedious. Will make careless mistakes at work. She reports that she has never been able to sit still and will fidget. She reports that she frequently interrupts others and completes their sentences.  She has had a cold and now her son is not feeling well.   Past Psychiatric Medication Trials: Remeron- Nasal congestion Cymbalta- Effective but caused constipation. PVC, SVT  Wellbutrin- reports that this was started for chronic fatigue. Stopped by neurologist due to interaction with another medication. She had  Difficulty with Wellbutrin XL.  Zoloft- adverse effect. Irritability. Affective dulling. Sexual side effects. Prozac- Effective and then started having HA's with it.  Paxil Lexapro Effexor Pristiq Trintellix Buspar Trazodone Topamax Propranolol  Review of Systems:  Review of Systems   Respiratory:  Positive for cough.   Cardiovascular:  Positive for palpitations.       Increased HR  Gastrointestinal:        Some increase in heartburn since stopping Omeprazole.  Musculoskeletal:  Negative for gait problem.  Neurological:  Negative for light-headedness.  Psychiatric/Behavioral:         Please refer to HPI   Medications: I have reviewed the patient's current medications.  Current Outpatient Medications  Medication Sig Dispense Refill   albuterol (PROVENTIL HFA;VENTOLIN HFA) 108 (90 BASE) MCG/ACT inhaler Inhale 1-2 puffs into the lungs every 6 (six) hours as needed for wheezing or shortness of breath.     b complex vitamins capsule Take 1 capsule by mouth daily.     BREO ELLIPTA 100-25 MCG/INH AEPB Inhale 1 puff into the lungs daily as needed (shortness of breath).      buPROPion (WELLBUTRIN SR) 150 MG 12 hr tablet Take 1 tablet (150 mg total) by mouth daily. 90 tablet 1   cholecalciferol (VITAMIN D3) 25 MCG (1000 UNIT) tablet Take 1,000 Units by mouth daily.     docusate sodium (COLACE) 100 MG capsule Take 100 mg by mouth daily as needed for mild constipation.     famotidine (PEPCID) 20 MG tablet Take 20 mg by mouth as needed for heartburn or indigestion.     Magnesium 250 MG TABS Take 250 mg by mouth every evening.      Probiotic Product (ALIGN) 4 MG CAPS Take 4 mg by mouth daily.     propranolol (INDERAL) 20 MG tablet Take 1 tablet (20 mg total) by mouth 3 (three) times daily as needed (Take as needed for palpitations). 30 tablet 5   rizatriptan (MAXALT) 10 MG tablet Take 10 mg by mouth as needed for migraine. May repeat in 2 hours if needed     Topiramate ER 150 MG CS24 Take 150 mg by mouth at bedtime.      benzonatate (TESSALON) 200 MG capsule Take by mouth.     desvenlafaxine (PRISTIQ) 50 MG 24 hr tablet Take 1 tablet (50 mg total) by mouth daily. 90 tablet 1   metoCLOPramide (REGLAN) 10 MG tablet Take 1 tablet (10 mg total) by mouth 3 (three) times daily as needed  for nausea or vomiting (headache / nausea). (Patient not taking: Reported on 06/17/2021) 6 tablet 0   omeprazole (PRILOSEC) 20 MG capsule Take 20 mg by mouth daily.  (Patient not taking: Reported on 01/18/2022)     pseudoephedrine (SUDAFED) 30 MG tablet 1 tablet as needed     spironolactone (ALDACTONE) 25 MG tablet Take 25 mg by mouth daily.     No current facility-administered medications for this visit.    Medication Side Effects: None  Allergies:  Allergies  Allergen Reactions   Ciprofloxacin Swelling    SWELLING REACTION UNSPECIFIED    Penicillins Rash and Other (See Comments)    Has patient had a PCN reaction causing immediate rash, facial/tongue/throat swelling, SOB or lightheadedness with hypotension: No Has patient had a PCN reaction causing severe rash involving mucus membranes or skin necrosis: Yes  Has patient had a PCN reaction that required hospitalization No Has patient had a PCN reaction occurring within the last 10 years: Yes  Paxil [Paroxetine Hcl] Other (See Comments)    UNSPECIFIED REACTION  [Does not remember]   Amoxicillin Rash   Codeine Nausea And Vomiting   Fluoxetine Other (See Comments)    Headaches   Guaifenesin & Derivatives Nausea And Vomiting    GI   Hydrocodone-Acetaminophen Nausea Only   Iodine Rash   Lactose Intolerance (Gi) Other (See Comments)    GI   Lactulose Nausea And Vomiting    GI   Percocet [Oxycodone-Acetaminophen] Nausea And Vomiting    Past Medical History:  Diagnosis Date   Acid reflux    Anxiety    Aortic valve disorder    Bicuspid aortic valve - stable by echo 11/2019   Asthma    Atypical chest pain    Chronic fatigue syndrome    Complication of anesthesia    nausea & vomiting and difficult time waking up   Compound nevus 01/2012   dysplastic, melanocytic atypia   Depression    crying spells for a year due to stress of  infertility   Deviated septum    Dr Ruby Cola   Endometriosis    Family history of adverse  reaction to anesthesia    sister-n/v   FH: thyroid condition    Gestational hypertension    H/O candidiasis    H/O constipation    H/O dyspareunia    H/O varicella    childhood   Headache(784.0)    migraine topomax   Heart murmur    congential bicuspid aortic valve, mitral valve prolapse   Herniated disc 2008   HTN (hypertension)    IBS (irritable bowel syndrome)    Infertility, female    Pelvic pain    Pneumonia    hx of   PONV (postoperative nausea and vomiting)    needs Scop patch   Postpartum hypertension    not being treated now   SVT (supraventricular tachycardia) ([facility])    UTI (lower urinary tract infection)    Yeast infection     Family History  Problem Relation Age of Onset   Diabetes Mother    Other Father        medical history unknown   Heart disease Maternal Uncle    Hypertension Maternal Grandmother    Diabetes Maternal Grandmother    Heart disease Maternal Grandmother    ADD / ADHD Son     Social History   Socioeconomic History   Marital status: Married    Spouse name: Not on file   Number of children: 2   Years of education: some college   Highest education level: Not on file  Occupational History   Occupation: pattern Agricultural engineer  Tobacco Use   Smoking status: Never   Smokeless tobacco: Never  Vaping Use   Vaping Use: Never used  Substance and Sexual Activity   Alcohol use: Yes    Comment: occasionally   Drug use: No   Sexual activity: Yes    Birth control/protection: Surgical  Other Topics Concern   Not on file  Social History Narrative   Lives at home with her family.   Right-handed.   1.5 cups caffeine per day.   Social Determinants of Health   Financial Resource Strain: Not on file  Food Insecurity: Not on file  Transportation Needs: Not on file  Physical Activity: Not on file  Stress: Not on file  Social Connections: Not on file  Intimate Partner Violence: Not on file    Past Medical History, Surgical history, Social  history,  and Family history were reviewed and updated as appropriate.   Please see review of systems for further details on the patient's review from today.   Objective:   Physical Exam:  LMP 02/13/2012 (Exact Date)   Physical Exam  Lab Review:     Component Value Date/Time   NA 138 08/25/2020 1903   NA 141 10/06/2018 0858   K 4.2 08/25/2020 1903   CL 104 08/25/2020 1903   CO2 24 08/25/2020 1903   GLUCOSE 117 (H) 08/25/2020 1903   BUN 13 08/25/2020 1903   BUN 13 10/06/2018 0858   CREATININE 0.66 08/25/2020 1903   CREATININE 0.85 08/04/2016 0944   CALCIUM 9.0 08/25/2020 1903   PROT 7.3 08/25/2020 1903   ALBUMIN 4.3 08/25/2020 1903   AST 21 08/25/2020 1903   ALT 27 08/25/2020 1903   ALKPHOS 58 08/25/2020 1903   BILITOT 0.5 08/25/2020 1903   GFRNONAA >60 08/25/2020 1903   GFRAA >60 08/25/2020 1903       Component Value Date/Time   WBC 13.2 (H) 08/25/2020 1903   RBC 5.33 (H) 08/25/2020 1903   HGB 15.5 (H) 08/25/2020 1903   HCT 46.9 (H) 08/25/2020 1903   PLT 316 08/25/2020 1903   MCV 88.0 08/25/2020 1903   MCH 29.1 08/25/2020 1903   MCHC 33.0 08/25/2020 1903   RDW 12.5 08/25/2020 1903   LYMPHSABS 2.3 08/25/2020 1903   MONOABS 0.6 08/25/2020 1903   EOSABS 0.0 08/25/2020 1903   BASOSABS 0.0 08/25/2020 1903    No results found for: POCLITH, LITHIUM   No results found for: PHENYTOIN, PHENOBARB, VALPROATE, CBMZ   .res Assessment: Plan:    Pt seen for 30 minutes and time spent discussing that she is reporting some s/s consistent with ADHD. Discussed that Wellbutrin is used off-label for ADHD. She reports that her concentration was better in the past when she was on Wellbutrin. Discussed option to re-start Wellbutrin and reviewed potential benefits, risks, and side effects. She reports that she would like to re-start Wellbutrin SR. Will re-start Wellbutrin SR 150 mg po q am instead of BID to start to determine tolerability. May consider future increase based upon  response.  Continue Pristiq 50 mg daily for anxiety and depression.  Pt to follow-up in 3 months or sooner if clinically indicated.  Patient advised to contact office with any questions, adverse effects, or acute worsening in signs and symptoms.   Madeline Everett was seen today for other and follow-up.  Diagnoses and all orders for this visit:  Generalized anxiety disorder -     desvenlafaxine (PRISTIQ) 50 MG 24 hr tablet; Take 1 tablet (50 mg total) by mouth daily.  Other orders -     buPROPion (WELLBUTRIN SR) 150 MG 12 hr tablet; Take 1 tablet (150 mg total) by mouth daily.     Please see After Visit Summary for patient specific instructions.  Future Appointments  Date Time Provider [place]  04/16/2022  9:30 AM Thayer Headings, PMHNP [facility] None    No orders of the defined types were placed in this encounter.     -------------------------------

## 2022-03-25 DIAGNOSIS — G43719 Chronic migraine without aura, intractable, without status migrainosus: Secondary | ICD-10-CM | POA: Diagnosis not present

## 2022-03-25 DIAGNOSIS — G43019 Migraine without aura, intractable, without status migrainosus: Secondary | ICD-10-CM | POA: Diagnosis not present

## 2022-03-25 DIAGNOSIS — M542 Cervicalgia: Secondary | ICD-10-CM | POA: Diagnosis not present

## 2022-04-16 ENCOUNTER — Encounter: Payer: Self-pay | Admitting: Psychiatry

## 2022-04-16 ENCOUNTER — Ambulatory Visit: Payer: BC Managed Care – PPO | Admitting: Psychiatry

## 2022-04-16 DIAGNOSIS — F411 Generalized anxiety disorder: Secondary | ICD-10-CM | POA: Diagnosis not present

## 2022-04-16 MED ORDER — DESVENLAFAXINE SUCCINATE ER 50 MG PO TB24: 50.0000 mg | ORAL_TABLET | Freq: Every day | ORAL | 0 refills | Status: DC

## 2022-04-16 MED ORDER — BUPROPION HCL ER (SR) 150 MG PO TB12: 150.0000 mg | ORAL_TABLET | Freq: Two times a day (BID) | ORAL | 1 refills | Status: DC

## 2022-04-16 NOTE — Progress Notes (Signed)
Madeline Everett [id number] Apr 15, 1976 46 y.o.  Subjective:   Patient ID:  Madeline Everett is a 46 y.o. (DOB 03-03-1976) female.  Chief Complaint: No chief complaint on file.   HPI Madeline Everett presents to the office today for follow-up of ***  She reports "I think the Wellbutrin is helping during the day" and notices that her energy and motivation is good throughout the day and wanes in the afternoon. Takes Wellbutrin SR around 6:30-7 am. She reports that Wellbutrin has helped with concentration at work.   Denies depressed mood. She reports that she has had a few episodes of chest tightness and Afib. She reports that Afib has decreased since re- starting Wellbutrin SR since she feels like she is accomplishing more tasks and "not letting people down." Reports that she takes Propranolol prn with good response. Sleeping well. Appetite has been ok. Denies SI.   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 She has to wear headphones at times in work.   Son started playing golf again and made it to the state championship.     Past Psychiatric Medication Trials: Remeron- Nasal congestion Cymbalta- Effective but caused constipation. PVC, SVT  Wellbutrin- reports that this was started for chronic fatigue. Stopped by neurologist due to interaction with another medication. She had Difficulty with Wellbutrin XL(helped and then crashed mid-day) Zoloft- adverse effect. Irritability. Affective dulling. Sexual side effects. Prozac- Effective and then started having HA's with it.  Paxil Lexapro Effexor Pristiq Trintellix Buspar Trazodone Topamax Propranolol  Review of Systems:  Review of Systems  Cardiovascular:        Occ chest tightness and palpitations, typically during or immediately after meetings  Musculoskeletal:  Negative for gait problem.  Neurological:  Negative for tremors.  Psychiatric/Behavioral:          Please refer to HPI   Medications: I have reviewed the patient's current medications.  Current Outpatient Medications  Medication Sig Dispense Refill  . albuterol (PROVENTIL HFA;VENTOLIN HFA) 108 (90 BASE) MCG/ACT inhaler Inhale 1-2 puffs into the lungs every 6 (six) hours as needed for wheezing or shortness of breath.    Marland Kitchen b complex vitamins capsule Take 1 capsule by mouth daily.    . benzonatate (TESSALON) 200 MG capsule Take by mouth.    Marland Kitchen BREO ELLIPTA 100-25 MCG/INH AEPB Inhale 1 puff into the lungs daily as needed (shortness of breath).     Marland Kitchen buPROPion (WELLBUTRIN SR) 150 MG 12 hr tablet Take 1 tablet (150 mg total) by mouth daily. 90 tablet 1  . cholecalciferol (VITAMIN D3) 25 MCG (1000 UNIT) tablet Take 1,000 Units by mouth daily.    Marland Kitchen desvenlafaxine (PRISTIQ) 50 MG 24 hr tablet Take 1 tablet (50 mg total) by mouth daily. 90 tablet 1  . docusate sodium (COLACE) 100 MG capsule Take 100 mg by mouth daily as needed for mild constipation.    . famotidine (PEPCID) 20 MG tablet Take 20 mg by mouth as needed for heartburn or indigestion.    . Magnesium 250 MG TABS Take 250 mg by mouth every evening.     . metoCLOPramide (REGLAN) 10 MG tablet Take 1 tablet (10 mg total) by mouth 3 (three) times daily as needed for nausea or vomiting (headache / nausea). (Patient not taking: Reported on 06/17/2021) 6 tablet 0  . omeprazole (PRILOSEC) 20 MG capsule Take 20 mg by mouth daily.  (Patient  not taking: Reported on 01/18/2022)    . Probiotic Product (ALIGN) 4 MG CAPS Take 4 mg by mouth daily.    . propranolol (INDERAL) 20 MG tablet Take 1 tablet (20 mg total) by mouth 3 (three) times daily as needed (Take as needed for palpitations). 30 tablet 5  . pseudoephedrine (SUDAFED) 30 MG tablet 1 tablet as needed    . rizatriptan (MAXALT) 10 MG tablet Take 10 mg by mouth as needed for migraine. May repeat in 2 hours if needed    . spironolactone (ALDACTONE) 25 MG tablet Take 25 mg by mouth daily.    . Topiramate  ER 150 MG CS24 Take 150 mg by mouth at bedtime.      No current facility-administered medications for this visit.    Medication Side Effects: None  Allergies:  Allergies  Allergen Reactions  . Ciprofloxacin Swelling    SWELLING REACTION UNSPECIFIED   . Penicillins Rash and Other (See Comments)    Has patient had a PCN reaction causing immediate rash, facial/tongue/throat swelling, SOB or lightheadedness with hypotension: No Has patient had a PCN reaction causing severe rash involving mucus membranes or skin necrosis: Yes  Has patient had a PCN reaction that required hospitalization No Has patient had a PCN reaction occurring within the last 10 years: Yes    . Paxil [Paroxetine Hcl] Other (See Comments)    UNSPECIFIED REACTION  [Does not remember]  . Amoxicillin Rash  . Codeine Nausea And Vomiting  . Fluoxetine Other (See Comments)    Headaches  . Guaifenesin & Derivatives Nausea And Vomiting    GI  . Hydrocodone-Acetaminophen Nausea Only  . Iodine Rash  . Lactose Intolerance (Gi) Other (See Comments)    GI  . Lactulose Nausea And Vomiting    GI  . Percocet [Oxycodone-Acetaminophen] Nausea And Vomiting    Past Medical History:  Diagnosis Date  . Acid reflux   . Anxiety   . Aortic valve disorder    Bicuspid aortic valve - stable by echo 11/2019  . Asthma   . Atypical chest pain   . Chronic fatigue syndrome   . Complication of anesthesia    nausea & vomiting and difficult time waking up  . Compound nevus 01/2012   dysplastic, melanocytic atypia  . Depression    crying spells for a year due to stress of  infertility  . Deviated septum    Dr Ruby Cola  . Endometriosis   . Family history of adverse reaction to anesthesia    sister-n/v  . FH: thyroid condition   . Gestational hypertension   . H/O candidiasis   . H/O constipation   . H/O dyspareunia   . H/O varicella    childhood  . Headache(784.0)    migraine topomax  . Heart murmur    congential bicuspid  aortic valve, mitral valve prolapse  . Herniated disc 2008  . HTN (hypertension)   . IBS (irritable bowel syndrome)   . Infertility, female   . Pelvic pain   . Pneumonia    hx of  . PONV (postoperative nausea and vomiting)    needs Scop patch  . Postpartum hypertension    not being treated now  . SVT (supraventricular tachycardia) ([facility])   . UTI (lower urinary tract infection)   . Yeast infection     Past Medical History, Surgical history, Social history, and Family history were reviewed and updated as appropriate.   Please see review of systems for further details on  the patient's review from today.   Objective:   Physical Exam:  LMP 02/13/2012 (Exact Date)   Physical Exam  Lab Review:     Component Value Date/Time   NA 138 08/25/2020 1903   NA 141 10/06/2018 0858   K 4.2 08/25/2020 1903   CL 104 08/25/2020 1903   CO2 24 08/25/2020 1903   GLUCOSE 117 (H) 08/25/2020 1903   BUN 13 08/25/2020 1903   BUN 13 10/06/2018 0858   CREATININE 0.66 08/25/2020 1903   CREATININE 0.85 08/04/2016 0944   CALCIUM 9.0 08/25/2020 1903   PROT 7.3 08/25/2020 1903   ALBUMIN 4.3 08/25/2020 1903   AST 21 08/25/2020 1903   ALT 27 08/25/2020 1903   ALKPHOS 58 08/25/2020 1903   BILITOT 0.5 08/25/2020 1903   GFRNONAA >60 08/25/2020 1903   GFRAA >60 08/25/2020 1903       Component Value Date/Time   WBC 13.2 (H) 08/25/2020 1903   RBC 5.33 (H) 08/25/2020 1903   HGB 15.5 (H) 08/25/2020 1903   HCT 46.9 (H) 08/25/2020 1903   PLT 316 08/25/2020 1903   MCV 88.0 08/25/2020 1903   MCH 29.1 08/25/2020 1903   MCHC 33.0 08/25/2020 1903   RDW 12.5 08/25/2020 1903   LYMPHSABS 2.3 08/25/2020 1903   MONOABS 0.6 08/25/2020 1903   EOSABS 0.0 08/25/2020 1903   BASOSABS 0.0 08/25/2020 1903    No results found for: POCLITH, LITHIUM   No results found for: PHENYTOIN, PHENOBARB, VALPROATE, CBMZ   .res Assessment: Plan:    There are no diagnoses linked to this encounter.   Please see After  Visit Summary for patient specific instructions.  No future appointments.  No orders of the defined types were placed in this encounter.   -------------------------------

## 2022-05-03 DIAGNOSIS — Z23 Encounter for immunization: Secondary | ICD-10-CM | POA: Diagnosis not present

## 2022-05-03 DIAGNOSIS — Z1159 Encounter for screening for other viral diseases: Secondary | ICD-10-CM | POA: Diagnosis not present

## 2022-05-03 DIAGNOSIS — Z13220 Encounter for screening for lipoid disorders: Secondary | ICD-10-CM | POA: Diagnosis not present

## 2022-05-03 DIAGNOSIS — Z0000 Encounter for general adult medical examination without abnormal findings: Secondary | ICD-10-CM | POA: Diagnosis not present

## 2022-06-19 DIAGNOSIS — W5503XA Scratched by cat, initial encounter: Secondary | ICD-10-CM | POA: Diagnosis not present

## 2022-06-19 DIAGNOSIS — R002 Palpitations: Secondary | ICD-10-CM | POA: Diagnosis not present

## 2022-06-22 ENCOUNTER — Telehealth: Payer: Self-pay | Admitting: Cardiology

## 2022-06-22 ENCOUNTER — Ambulatory Visit (INDEPENDENT_AMBULATORY_CARE_PROVIDER_SITE_OTHER): Payer: BC Managed Care – PPO

## 2022-06-22 DIAGNOSIS — R002 Palpitations: Secondary | ICD-10-CM

## 2022-06-22 NOTE — Telephone Encounter (Signed)
Pt advised we are changing the order to a Zio XT.

## 2022-06-22 NOTE — Progress Notes (Unsigned)
Enrolled for [employer] to mail a ZIO XT long term holter monitor to the patients address on file.  

## 2022-06-22 NOTE — Telephone Encounter (Signed)
Patient c/o Palpitations:  High priority if patient c/o lightheadedness, shortness of breath, or chest pain  How long have you had palpitations/irregular HR/ Afib? Are you having the symptoms now? Everyday last week. Before that it was every other day.   Are you currently experiencing lightheadedness, SOB or CP? SOB and lightheaded off and on  Do you have a history of afib (atrial fibrillation) or irregular heart rhythm? Yes  Have you checked your BP or HR? (document readings if available):  Ranges between 55-118 HR Normal is around 80   Are you experiencing any other symptoms? She states she is tired all the time.  Pt went to urgent care 07/22 and was diagnosed with PVC heart rhythm. She would like to speak to someone about this.

## 2022-06-22 NOTE — Telephone Encounter (Signed)
Get a 2-week live Zio patch   Pt advised and verbalized understanding. She requested to keep her 06/28/22 appt for now.

## 2022-06-22 NOTE — Telephone Encounter (Signed)
Per device nurse, pts insurance does not cover a live ZIO... asking if we can order an Event monitor or ZIO XT.   Will forward back to Dr Radford Pax for new order.

## 2022-06-22 NOTE — Telephone Encounter (Signed)
Pt called to report that over the past few weeks her palpitations have been worsening... she is having them more frequently and has been having a lot of fatigue as well.... she denies dizziness, SOB.... she has recently started Wellbutrin.   She will talk to the prescribing MD to cut back on the dosing for now to see if it helps with her palpitations.   She has an appt with Melina Copa PA 06/28/22 but will call sooner if any worsens.   She will also take her propranolol more often PRN... as noted can take up to three times day as need for palpations.

## 2022-06-25 DIAGNOSIS — R002 Palpitations: Secondary | ICD-10-CM | POA: Diagnosis not present

## 2022-06-27 NOTE — Progress Notes (Unsigned)
Cardiology Office Note:    Date:  06/28/2022   ID:  Madeline Everett, DOB 11-26-1976, MRN [id number]  PCP:  Harlan Stains, MD   [facility] Cardiologist:  Fransico Him, MD     Referring MD: Harlan Stains, MD   CC: Palpitations   History of Present Illness:    Madeline Everett is a 46 y.o. female with a hx of  the following:   HTN SVT Bicuspid aortic valve Near syncope Generalized anxiety disorder GERD  Patient of Dr. Theodosia Blender and has been seen for a history of intermittent palpitations. In 2021 she was describing "spells" where she would suddenly lose control of her body. 30-day monitor revealed NSR and ST with HR ranging from 60 bpm to 153 bpm with average HR of 91 bpm.  No significant arrhythmia was noted. She followed up with neurology who diagnosed her with vasovagal episodes and adjusted her anxiety medication. 2D Echo on December 24, 2019 at the time revealed LVEF of 34%, normal diastolic parameters and normal left ventricular size, right ventricular normal size and function, trivial mitral valve regurgitation, bicuspid aortic valve with no regurgitation or stenosis.  No R WMA.  Most recent echo on November 29, 2021 overall stable, revealed LVEF of 28%, grade 1 diastolic dysfunction, mild mitral valve regurgitation, bicuspid aortic valve, no aortic stenosis or aortic valve regurg noted.   Last seen in the cardiology office on May 29, 2021 and was seen by Laurann Montana, NP. Stated vasovagal episodes were more noticeable with high sugar intake and large meals, were overall well controlled since using preventative measures.  Stated palpitations were often triggered by anxiety or stress.  Started on 20 mg of propranolol daily PRN for palpitations. Denied any chest pain, shortness of breath, dyspnea on exertion, edema, orthopnea, PND, syncope, or near syncope.  More recently, she contacted our office on June 22, 2022 stating that over the last few weeks palpitations  have been worsening, occurring more frequently, and having significant fatigue.  Recently started on Wellbutrin.  Denied dizziness or shortness of breath.  Stated she would take her propranolol more often as needed for palpitations.  14 day ZIO XT monitor was arranged for her.   Today she presents for follow-up.  States ever since she contacted the office on July 25th, her palpitations have improved and she is only taking propranolol half dose (10 mg) 3 times a day as needed.  Only got a little short of breath this past week for palpitations. She is noted to have frequent PACs (abberrantly conducted) on EKG today. She is currently wearing 14-day ZIO monitor and will continue to wear this until August 11th or 12th.  States heart rate on her pulse ox has been overall stable, blood pressures at home are around 130s which is her normal and sometimes have been in the 110s to 111's.  Denies any presyncope or syncope, however has mild orthostatic dizziness when getting up too fast in the morning, this has improved as she gets up slowly.  Denies any orthopnea, PND, swelling, bleeding, claudication, and weight changes.  Had 1 episode of chest pressure on 7/22 that lasted for about 5 to 6 minutes located in the mid, lower chest around epigastric area and some back pain. Etiology sounds like musculoskeletal, GI, or anxiety as she did some deep breathing and stretching and this resolved it, she has not had any symptoms since.  Denies any exercise intolerance.  Denies alcohol use, drug use or smoking. Works  at [employer].  Does state Wellbutrin is helping her concentration at work but wants to know if this is causing her heart palpitations.  Denies any other questions or concerns.   Past Medical History:  Diagnosis Date   Acid reflux    Anxiety    Aortic valve disorder    Bicuspid aortic valve - stable by echo 11/2019   Asthma    Atypical chest pain    Chronic fatigue syndrome    Complication of anesthesia     nausea & vomiting and difficult time waking up   Compound nevus 01/2012   dysplastic, melanocytic atypia   Depression    crying spells for a year due to stress of  infertility   Deviated septum    Dr Ruby Cola   Endometriosis    Family history of adverse reaction to anesthesia    sister-n/v   FH: thyroid condition    Gestational hypertension    H/O candidiasis    H/O constipation    H/O dyspareunia    H/O varicella    childhood   Headache(784.0)    migraine topomax   Heart murmur    congential bicuspid aortic valve, mitral valve prolapse   Herniated disc 2008   HTN (hypertension)    IBS (irritable bowel syndrome)    Infertility, female    Pelvic pain    Pneumonia    hx of   PONV (postoperative nausea and vomiting)    needs Scop patch   Postpartum hypertension    not being treated now   SVT (supraventricular tachycardia) ([place])    UTI (lower urinary tract infection)    Yeast infection     Past Surgical History:  Procedure Laterality Date   ABDOMINAL HYSTERECTOMY     BUNIONECTOMY  1998   CESAREAN SECTION  2010   CHOLECYSTECTOMY N/A 08/27/2019   Procedure: LAPAROSCOPIC CHOLECYSTECTOMY;  Surgeon: Jovita Kussmaul, MD;  Location: [facility];  Service: General;  Laterality: N/A;   COLONOSCOPY WITH PROPOFOL N/A 08/20/2015   Procedure: COLONOSCOPY WITH PROPOFOL;  Surgeon: Arta Silence, MD;  Location: [facility];  Service: Endoscopy;  Laterality: N/A;   dermoid cyst  removed   2010   DILATION AND CURETTAGE OF UTERUS  12/31/10   ENTEROSCOPY N/A 08/20/2015   Procedure: ENTEROSCOPY;  Surgeon: Arta Silence, MD;  Location: [facility];  Service: Endoscopy;  Laterality: N/A;   FOOT SURGERY     herniated disc surgery  2008   LAPAROSCOPIC ENDOMETRIOSIS FULGURATION     ROBOTIC ASSISTED TOTAL HYSTERECTOMY Bilateral 11/01/2013   Procedure: ROBOTIC ASSISTED TOTAL HYSTERECTOMY WITH BILATERAL SALPINOGECTOMY;  Surgeon: Alwyn Pea, MD;  Location: [facility];  Service: Gynecology;   Laterality: Bilateral;   TONSILLECTOMY  1993    Current Medications: Current Meds  Medication Sig   albuterol (PROVENTIL HFA;VENTOLIN HFA) 108 (90 BASE) MCG/ACT inhaler Inhale 1-2 puffs into the lungs every 6 (six) hours as needed for wheezing or shortness of breath.   b complex vitamins capsule Take 1 capsule by mouth daily.   beclomethasone (QVAR) 40 MCG/ACT inhaler Inhale 2 puffs into the lungs 2 (two) times daily. Uses for allergies   buPROPion (WELLBUTRIN SR) 150 MG 12 hr tablet Take 1 tablet (150 mg total) by mouth 2 (two) times daily.   cholecalciferol (VITAMIN D3) 25 MCG (1000 UNIT) tablet Take 1,000 Units by mouth daily.   desvenlafaxine (PRISTIQ) 50 MG 24 hr tablet Take 1 tablet (50 mg total) by mouth daily.   docusate sodium (  COLACE) 100 MG capsule Take 100 mg by mouth daily as needed for mild constipation.   famotidine (PEPCID) 20 MG tablet Take 20 mg by mouth as needed for heartburn or indigestion.   fluticasone (FLONASE) 50 MCG/ACT nasal spray Place into both nostrils daily.   Magnesium 250 MG TABS Take 250 mg by mouth every evening.    Probiotic Product (ALIGN) 4 MG CAPS Take 4 mg by mouth daily.   propranolol (INDERAL) 20 MG tablet Take 1 tablet (20 mg total) by mouth 3 (three) times daily as needed (Take as needed for palpitations).   propranolol ER (INDERAL LA) 60 MG 24 hr capsule Take 1 capsule (60 mg total) by mouth daily.   rizatriptan (MAXALT) 10 MG tablet Take 10 mg by mouth as needed for migraine. May repeat in 2 hours if needed   Topiramate ER 150 MG CS24 Take 150 mg by mouth at bedtime.      Allergies:   Ciprofloxacin, Penicillins, Amoxicillin, Codeine, Fluoxetine, Guaifenesin & derivatives, Hydrocodone-acetaminophen, Iodine, Lactose intolerance (gi), Lactulose, Paxil [paroxetine hcl], and Percocet [oxycodone-acetaminophen]   Social History   Socioeconomic History   Marital status: Married    Spouse name: Not on file   Number of children: 2   Years of  education: some college   Highest education level: Not on file  Occupational History   Occupation: pattern Agricultural engineer  Tobacco Use   Smoking status: Never   Smokeless tobacco: Never  Vaping Use   Vaping Use: Never used  Substance and Sexual Activity   Alcohol use: Yes    Comment: occasionally   Drug use: No   Sexual activity: Yes    Birth control/protection: Surgical  Other Topics Concern   Not on file  Social History Narrative   Lives at home with her family.   Right-handed.   1.5 cups caffeine per day.   Social Determinants of Health   Financial Resource Strain: Not on file  Food Insecurity: Not on file  Transportation Needs: Not on file  Physical Activity: Not on file  Stress: Not on file  Social Connections: Not on file     Family History: The patient's family history includes ADD / ADHD in her son; Diabetes in her maternal grandmother and mother; Heart disease in her maternal grandmother and maternal uncle; Hypertension in her maternal grandmother; Other in her father. Family history is significant for mitral valve replacement, CABG, and bicuspid aortic valve.  ROS:    Review of Systems  Constitutional: Negative.   HENT: Negative.    Eyes: Negative.   Respiratory:  Positive for shortness of breath. Negative for cough, hemoptysis, sputum production and wheezing.        See HPI.   Cardiovascular:  Positive for palpitations. Negative for chest pain, orthopnea, claudication, leg swelling and PND.       See HPI. No current CP.   Gastrointestinal: Negative.   Genitourinary: Negative.   Musculoskeletal:  Positive for back pain. Negative for falls, joint pain, myalgias and neck pain.       See HPI.   Skin: Negative.        Recent skin abrasions from dog - healing.   Neurological:  Positive for dizziness. Negative for tingling, tremors, sensory change, speech change, focal weakness, seizures, loss of consciousness, weakness and headaches.       Recent orthostatic dizziness  from getting up too quickly in the morning - this has resolved.   Endo/Heme/Allergies: Negative.   Psychiatric/Behavioral: Negative.  Please see the history of present illness.    All other systems reviewed and are negative.  EKGs/Labs/Other Studies Reviewed:    The following studies were reviewed today:   EKG:  EKG is ordered today.  The ekg ordered today demonstrates SR 93bpm,  frequent PACs (abberrantly conducted), otherwise no acute changes.   2D echo on December 08, 2021: Left ventricle: LVEF is 52%.  Left ventricle has low normal function.  Left ventricle has no regional wall motion abnormalities.  The average left ventricular global longitudinal strain is -19.2%.  The global longitudinal strain is normal.  The left ventricular internal cavity size was normal in size.  There is no left ventricular hypertrophy.  Left ventricular diastolic parameters are consistent with grade 1 diastolic dysfunction (impaired relaxation). Right ventricle: The right ventricular size is normal.  No increase in right ventricular wall thickness.  Right ventricular systolic function is normal. Left atrium left atrial size was normal in size. Right atrium right atrial size was normal in size. Pericardium: There is no evidence of pericardial effusion. Mitral valve: The mitral valve is grossly normal.  Mild mitral valve regurgitation.  No evidence of mitral valve stenosis. Cuspid valve: The tricuspid valve is normal in structure.  Tricuspid valve regurgitation is not demonstrated.  No evidence of tricuspid valve stenosis. Aortic valve: Aortic valve previously described as bicuspid.  The aortic valve was not well visualized.  Aortic valve regurgitation is not visualized.  No aortic stenosis is present. Pulmonic valve: The pulmonic valve was normal in structure pulmonic valve regurgitation is not visualized.  No evidence of pulmonic stenosis. Aorta: The aortic root is normal in size and structure. Venous: The  IVC is normal in size with greater than 50% respiratory variability, suggesting right atrial pressure of 3 mmHg. IAS/Shunts: No atrial left shunt detected by color-flow Doppler.   Event monitor on January 18, 2020: Normal sinus rhythm with sinus tachycardia.  The average heart rate was 91 bpm and range from 68 to 153 bpm.  No significant arrhythmias or abnormalities noted.   Cardiac MRI, thoracic aorta MRA on August 23, 2016: 1.  Normal left ventricular size, thickness and systolic function (LVEF = 60%) with no regional wall motion abnormalities. 2.  Normal right ventricular size, thickness and systolic function (LVEF = 60%) with no regional wall motion abnormalities. Normal size of the aortic root (35 mm),  ascending aorta (31 mm), aortic arch and descending thoracic aorta. 4.  Trivial mitral and mitral tricuspid regurgitation. 5.  Bicuspid aortic valve with no evidence of stenosis or regurgitation.  Mean transaortic gradient 4 mmHg.  24-hour Holter monitor on May 12, 2016: Normal sinus rhythm and sinus tachycardia and sinus arrhythmia noted.  Average heart rate was 85 bpm.  Heart rate ranged from 69 to 130 bpm.  Occasional PVCs and PACs.   Nuclear medicine stress test on Apr 11, 2014: Normal stress test.    Recent Labs: No results found for requested labs within last 365 days.  Recent Lipid Panel No results found for: "CHOL", "TRIG", "HDL", "CHOLHDL", "VLDL", "LDLCALC", "LDLDIRECT"    Physical Exam:    VS:  BP 110/76   Pulse 93   Ht '5\' 4"'$  (1.626 m)   Wt 155 lb (70.3 kg)   LMP 02/13/2012 (Exact Date)   SpO2 99%   BMI 26.61 kg/m     Wt Readings from Last 3 Encounters:  06/28/22 155 lb (70.3 kg)  05/29/21 150 lb 9.6 oz (68.3 kg)  08/25/20 140 lb (  63.5 kg)     GEN: Well nourished, well developed in no acute distress HEENT: Normal NECK: No JVD; No carotid bruits CARDIAC: Irregular rhythm noted with early beats noted, normal rate, no murmurs, rubs, gallops; 2 +  pulses throughout,  strong and equal bilaterally. RESPIRATORY:  Clear and diminished to auscultation without rales, wheezing or rhonchi  ABDOMEN: Soft, non-tender, non-distended, bowel sounds X 4 MUSCULOSKELETAL:  No edema; No deformity  SKIN: Warm and dry, scattered healed abrasions along BLE; otherwise, normal NEUROLOGIC:  Alert and oriented x 3 PSYCHIATRIC:  Normal affect   ASSESSMENT:    1. Palpitations   2. Essential hypertension, benign   3. Bicuspid aortic valve    PLAN:    In order of problems listed above:  Palpitations- chronic, improved, with PACs on EKG Twelve-lead EKG shows normal sinus rhythm with occasional abberrantly conducted PACs, 93 bpm, otherwise no acute changes.  Stable and has improved with 10 mg of propranolol TID PRN for palpitations.  Will prescribe long-acting propanolol 60 mg daily to improve her symptoms more.  We will wait to see results from 14-day ZIO monitor. Will obtain TSH, CBC, magnesium, and BMET today. Continue to follow with PCP regarding Wellbutrin.   2. Hypertension - chronic, stable Blood pressure stable on exam at 110/76.  Home blood pressures are stable according her report. Discussed to monitor BP at home at least 2 hours after medications and sitting for 5-10 minutes.  Initiate propranolol 60 mg daily as noted above.  She wants to see how she will do on this new dose of Propranolol. If she has more of a drop in blood pressure or she becomes symptomatic for hypotension, she will let us know and we will need to decrease this dose.   3. Bicuspid aortic valve - chronic, stable This runs in her family history per her report and she has been aware of this since she was 46 years old.  Last echocardiogram Jan 2023 revealed LVEF 52%., Grade 1 diastolic dysfunction, Mild mitral valve regurgitation noted in the last echo, no mitral valve stenosis, otherwise nothing acute. Will continue to monitor. She had normal aorta size by screening MR in 2017. Consider  repeat echo in 3-5 years, sooner if indicated.  4. Disposition: Follow-up with APP in 3 months and follow-up with Dr. Radford Pax in December 2023.  Medication Adjustments/Labs and Tests Ordered: Current medicines are reviewed at length with the patient today.  Concerns regarding medicines are outlined above.  Orders Placed This Encounter  Procedures   TSH   CBC   Magnesium   Basic Metabolic Panel (BMET)   EKG 12-Lead   Meds ordered this encounter  Medications   propranolol ER (INDERAL LA) 60 MG 24 hr capsule    Sig: Take 1 capsule (60 mg total) by mouth daily.    Dispense:  90 capsule    Refill:  1    Patient Instructions  Medication Instructions:  START Propranolol '60mg'$  Take 1 tablet once a day  *If you need a refill on your cardiac medications before your next appointment, please call your pharmacy*   Lab Work: TODAY-TSH, CBC, MAG, BMET If you have labs (blood work) drawn today and your tests are completely normal, you will receive your results only by: [place] (if you have MyChart) OR A paper copy in the mail If you have any lab test that is abnormal or we need to change your treatment, we will call you to review the results.   Testing/Procedures:  NONE ORDERED   Follow-Up: At [facility], you and your health needs are our priority.  As part of our continuing mission to provide you with exceptional heart care, we have created designated Provider Care Teams.  These Care Teams include your primary Cardiologist (physician) and Advanced Practice Providers (APPs -  Physician Assistants and Nurse Practitioners) who all work together to provide you with the care you need, when you need it.  We recommend signing up for the patient portal called "MyChart".  Sign up information is provided on this After Visit Summary.  MyChart is used to connect with patients for Virtual Visits (Telemedicine).  Patients are able to view lab/test results, encounter notes, upcoming  appointments, etc.  Non-urgent messages can be sent to your provider as well.   To learn more about what you can do with MyChart, go to [email or web address].    Your next appointment:   3 month(s)  The format for your next appointment:   In Person  Provider:   Melina Copa, PA-C        Other Instructions   Important Information About Sugar         Signed, Finis Bud, NP  06/28/2022 9:38 AM    [place]

## 2022-06-28 ENCOUNTER — Ambulatory Visit: Payer: BC Managed Care – PPO | Admitting: Nurse Practitioner

## 2022-06-28 ENCOUNTER — Encounter: Payer: Self-pay | Admitting: Physician Assistant

## 2022-06-28 VITALS — BP 110/76 | HR 93 | Ht 64.0 in | Wt 155.0 lb

## 2022-06-28 DIAGNOSIS — I10 Essential (primary) hypertension: Secondary | ICD-10-CM

## 2022-06-28 DIAGNOSIS — I491 Atrial premature depolarization: Secondary | ICD-10-CM | POA: Diagnosis not present

## 2022-06-28 DIAGNOSIS — Q231 Congenital insufficiency of aortic valve: Secondary | ICD-10-CM

## 2022-06-28 DIAGNOSIS — R002 Palpitations: Secondary | ICD-10-CM

## 2022-06-28 LAB — CBC
Hematocrit: 43.1 % (ref 34.0–46.6)
Hemoglobin: 14.7 g/dL (ref 11.1–15.9)
MCH: 29.2 pg (ref 26.6–33.0)
MCHC: 34.1 g/dL (ref 31.5–35.7)
MCV: 86 fL (ref 79–97)
Platelets: 286 10*3/uL (ref 150–450)
RBC: 5.03 x10E6/uL (ref 3.77–5.28)
RDW: 12.7 % (ref 11.7–15.4)
WBC: 8.8 10*3/uL (ref 3.4–10.8)

## 2022-06-28 LAB — BASIC METABOLIC PANEL
BUN/Creatinine Ratio: 8 — ABNORMAL LOW (ref 9–23)
BUN: 8 mg/dL (ref 6–24)
CO2: 19 mmol/L — ABNORMAL LOW (ref 20–29)
Calcium: 9 mg/dL (ref 8.7–10.2)
Chloride: 108 mmol/L — ABNORMAL HIGH (ref 96–106)
Creatinine, Ser: 1.02 mg/dL — ABNORMAL HIGH (ref 0.57–1.00)
Glucose: 86 mg/dL (ref 70–99)
Potassium: 5.1 mmol/L (ref 3.5–5.2)
Sodium: 139 mmol/L (ref 134–144)
eGFR: 70 mL/min/{1.73_m2} (ref >59)

## 2022-06-28 LAB — TSH: TSH: 2.4 u[IU]/mL (ref 0.450–4.500)

## 2022-06-28 LAB — MAGNESIUM: Magnesium: 2.2 mg/dL (ref 1.6–2.3)

## 2022-06-28 MED ORDER — PROPRANOLOL HCL ER 60 MG PO CP24: 60.0000 mg | ORAL_CAPSULE | Freq: Every day | ORAL | 1 refills | Status: DC

## 2022-06-28 NOTE — Patient Instructions (Addendum)
Medication Instructions:  START Propranolol '60mg'$  Take 1 tablet once a day  *If you need a refill on your cardiac medications before your next appointment, please call your pharmacy*   Lab Work: TODAY-TSH, CBC, MAG, BMET If you have labs (blood work) drawn today and your tests are completely normal, you will receive your results only by: [place] (if you have MyChart) OR A paper copy in the mail If you have any lab test that is abnormal or we need to change your treatment, we will call you to review the results.   Testing/Procedures: NONE ORDERED   Follow-Up: At [facility], you and your health needs are our priority.  As part of our continuing mission to provide you with exceptional heart care, we have created designated Provider Care Teams.  These Care Teams include your primary Cardiologist (physician) and Advanced Practice Providers (APPs -  Physician Assistants and Nurse Practitioners) who all work together to provide you with the care you need, when you need it.  We recommend signing up for the patient portal called "MyChart".  Sign up information is provided on this After Visit Summary.  MyChart is used to connect with patients for Virtual Visits (Telemedicine).  Patients are able to view lab/test results, encounter notes, upcoming appointments, etc.  Non-urgent messages can be sent to your provider as well.   To learn more about what you can do with MyChart, go to [email or web address].    Your next appointment:   3 month(s)  The format for your next appointment:   In Person  Provider:   Melina Copa, PA-C        Other Instructions   Important Information About Sugar

## 2022-06-30 ENCOUNTER — Telehealth: Payer: Self-pay

## 2022-06-30 DIAGNOSIS — R002 Palpitations: Secondary | ICD-10-CM

## 2022-06-30 DIAGNOSIS — I10 Essential (primary) hypertension: Secondary | ICD-10-CM

## 2022-06-30 NOTE — Telephone Encounter (Signed)
The patient has been notified of the result and verbalized understanding.  All questions (if any) were answered. Antonieta Iba, RN 06/30/2022 9:49 AM  Patient denies any UTI symptoms.  Repeat labs have been scheduled.

## 2022-06-30 NOTE — Telephone Encounter (Signed)
-----   Message from Finis Bud, NP sent at 06/29/2022  8:48 PM EDT ----- Please update patient about the following labs:  "Your kidney function was elevated from one year prior. Any UTI symptoms: frequent urination, burning, frequency, etc.? If so, we will need to order UA and urine culture for her. Thyroid level, blood counts, and magnesium were all normal. Benjamine Mola would like to repeat your kidney function panel (BMET) in 2-3 weeks to recheck this. Let's see how you do on Propranolol 60 mg PO dail for your palpitations as we discussed in the office and we will see what the 14 day monitor shows.  If you notice you have more of a drop in blood pressure <100/60 or you become symptomatic for hypotension (lightheaded, dizzy, syncope/presyncope, confused, lethargy, etc)., you will need to let us know and we will need to decrease this dose."  Thanks so much!   Finis Bud, AGNP-C

## 2022-07-14 ENCOUNTER — Other Ambulatory Visit: Payer: BC Managed Care – PPO

## 2022-07-14 DIAGNOSIS — R002 Palpitations: Secondary | ICD-10-CM | POA: Diagnosis not present

## 2022-07-14 DIAGNOSIS — I10 Essential (primary) hypertension: Secondary | ICD-10-CM

## 2022-07-15 DIAGNOSIS — R002 Palpitations: Secondary | ICD-10-CM | POA: Diagnosis not present

## 2022-07-15 LAB — BASIC METABOLIC PANEL
BUN/Creatinine Ratio: 13 (ref 9–23)
BUN: 12 mg/dL (ref 6–24)
CO2: 18 mmol/L — ABNORMAL LOW (ref 20–29)
Calcium: 9.2 mg/dL (ref 8.7–10.2)
Chloride: 107 mmol/L — ABNORMAL HIGH (ref 96–106)
Creatinine, Ser: 0.92 mg/dL (ref 0.57–1.00)
Glucose: 80 mg/dL (ref 70–99)
Potassium: 4.3 mmol/L (ref 3.5–5.2)
Sodium: 141 mmol/L (ref 134–144)
eGFR: 79 mL/min/{1.73_m2} (ref >59)

## 2022-07-16 ENCOUNTER — Ambulatory Visit: Payer: BC Managed Care – PPO | Admitting: Psychiatry

## 2022-07-20 ENCOUNTER — Telehealth: Payer: Self-pay

## 2022-07-20 DIAGNOSIS — I471 Supraventricular tachycardia: Secondary | ICD-10-CM

## 2022-07-20 DIAGNOSIS — R002 Palpitations: Secondary | ICD-10-CM

## 2022-07-20 DIAGNOSIS — R55 Syncope and collapse: Secondary | ICD-10-CM

## 2022-07-20 NOTE — Telephone Encounter (Signed)
-----   Message from Sueanne Margarita, MD sent at 07/16/2022  9:27 PM EDT ----- Heart monitor showed very high % of ventricular ectopy as well as NSVT.  She had a normal cMRI in 2017 except for Bicuspid AV.    Please repeat cardiac MRI with gad due to NSVT and refer ASAP to EP as she has been having ? Seizure like activity that may be related to arrhythmias

## 2022-07-20 NOTE — Telephone Encounter (Signed)
The patient has been notified of the result and verbalized understanding.  All questions (if any) were answered. Antonieta Iba, RN 07/20/2022 10:38 AM  Cardiac MRI has been ordered. Referral has been placed.

## 2022-07-21 DIAGNOSIS — R198 Other specified symptoms and signs involving the digestive system and abdomen: Secondary | ICD-10-CM | POA: Diagnosis not present

## 2022-07-21 DIAGNOSIS — U071 COVID-19: Secondary | ICD-10-CM | POA: Diagnosis not present

## 2022-09-04 ENCOUNTER — Other Ambulatory Visit: Payer: Self-pay | Admitting: Psychiatry

## 2022-09-04 DIAGNOSIS — F411 Generalized anxiety disorder: Secondary | ICD-10-CM

## 2022-09-06 DIAGNOSIS — Z8742 Personal history of other diseases of the female genital tract: Secondary | ICD-10-CM | POA: Insufficient documentation

## 2022-09-06 DIAGNOSIS — Q231 Congenital insufficiency of aortic valve: Secondary | ICD-10-CM | POA: Insufficient documentation

## 2022-09-06 DIAGNOSIS — E079 Disorder of thyroid, unspecified: Secondary | ICD-10-CM | POA: Insufficient documentation

## 2022-09-06 DIAGNOSIS — Z98891 History of uterine scar from previous surgery: Secondary | ICD-10-CM | POA: Insufficient documentation

## 2022-09-06 NOTE — Telephone Encounter (Signed)
Please call to schedule an appt  

## 2022-09-07 ENCOUNTER — Ambulatory Visit: Payer: BC Managed Care – PPO | Attending: Internal Medicine | Admitting: Internal Medicine

## 2022-09-07 ENCOUNTER — Encounter: Payer: Self-pay | Admitting: Internal Medicine

## 2022-09-07 VITALS — BP 126/82 | HR 73 | Ht 64.0 in | Wt 155.0 lb

## 2022-09-07 DIAGNOSIS — I10 Essential (primary) hypertension: Secondary | ICD-10-CM | POA: Diagnosis not present

## 2022-09-07 DIAGNOSIS — I471 Supraventricular tachycardia, unspecified: Secondary | ICD-10-CM

## 2022-09-07 DIAGNOSIS — R002 Palpitations: Secondary | ICD-10-CM

## 2022-09-07 NOTE — Progress Notes (Signed)
HPI Ms. Catanese is referred by Dr. Radford Pax for evaluation of PVC's. She is a pleasant 46 yo woman with a h/o spells who was found to have 16% PVC's on a Zio monitor. She was placed on propranolol and feels better. She has not had much trouble tolerating the propranolol. She denies syncope. No edema. No chest pain or sob. She remains active working for [employer].  Allergies  Allergen Reactions   Ciprofloxacin Swelling    SWELLING REACTION UNSPECIFIED    Penicillins Rash and Other (See Comments)    Has patient had a PCN reaction causing immediate rash, facial/tongue/throat swelling, SOB or lightheadedness with hypotension: No Has patient had a PCN reaction causing severe rash involving mucus membranes or skin necrosis: Yes  Has patient had a PCN reaction that required hospitalization No Has patient had a PCN reaction occurring within the last 10 years: Yes     Amoxicillin Rash   Codeine Nausea And Vomiting   Fluoxetine Other (See Comments)    Headaches   Guaifenesin & Derivatives Nausea And Vomiting    GI   Hydrocodone-Acetaminophen Nausea Only   Iodine Rash   Lactose Intolerance (Gi) Other (See Comments)    GI   Lactulose Nausea And Vomiting    GI   Paxil [Paroxetine Hcl] Other (See Comments)    UNSPECIFIED REACTION  [Does not remember]   Percocet [Oxycodone-Acetaminophen] Nausea And Vomiting     Current Outpatient Medications  Medication Sig Dispense Refill   albuterol (PROVENTIL HFA;VENTOLIN HFA) 108 (90 BASE) MCG/ACT inhaler Inhale 1-2 puffs into the lungs every 6 (six) hours as needed for wheezing or shortness of breath.     b complex vitamins capsule Take 1 capsule by mouth daily.     buPROPion (WELLBUTRIN SR) 150 MG 12 hr tablet Take 1 tablet (150 mg total) by mouth 2 (two) times daily. 180 tablet 1   cholecalciferol (VITAMIN D3) 25 MCG (1000 UNIT) tablet Take 1,000 Units by mouth daily.     desvenlafaxine (PRISTIQ) 50 MG 24 hr tablet TAKE 1 TABLET BY MOUTH  EVERY DAY 90 tablet 0   docusate sodium (COLACE) 100 MG capsule Take 100 mg by mouth daily as needed for mild constipation.     famotidine (PEPCID) 20 MG tablet Take 20 mg by mouth as needed for heartburn or indigestion.     FLOVENT HFA 110 MCG/ACT inhaler Inhale 2 puffs into the lungs 2 (two) times daily.     fluticasone (FLONASE) 50 MCG/ACT nasal spray Place into both nostrils daily.     Magnesium 250 MG TABS Take 250 mg by mouth every evening.      ondansetron (ZOFRAN-ODT) 4 MG disintegrating tablet Take 4 mg by mouth daily.     Probiotic Product (ALIGN) 4 MG CAPS Take 4 mg by mouth daily.     propranolol ER (INDERAL LA) 60 MG 24 hr capsule Take 1 capsule (60 mg total) by mouth daily. 90 capsule 1   rizatriptan (MAXALT) 10 MG tablet Take 10 mg by mouth as needed for migraine. May repeat in 2 hours if needed     Topiramate ER 150 MG CS24 Take 150 mg by mouth at bedtime.      No current facility-administered medications for this visit.     Past Medical History:  Diagnosis Date   Acid reflux    Anxiety    Aortic valve disorder    Bicuspid aortic valve - stable by echo 11/2019   Asthma  Atypical chest pain    Chronic fatigue syndrome    Complication of anesthesia    nausea & vomiting and difficult time waking up   Compound nevus 01/2012   dysplastic, melanocytic atypia   Depression    crying spells for a year due to stress of  infertility   Deviated septum    Dr Ruby Cola   Endometriosis    Family history of adverse reaction to anesthesia    sister-n/v   FH: thyroid condition    Gestational hypertension    H/O candidiasis    H/O constipation    H/O dyspareunia    H/O varicella    childhood   Headache(784.0)    migraine topomax   Heart murmur    congential bicuspid aortic valve, mitral valve prolapse   Herniated disc 2008   HTN (hypertension)    IBS (irritable bowel syndrome)    Infertility, female    Pelvic pain    Pneumonia    hx of   PONV (postoperative  nausea and vomiting)    needs Scop patch   Postpartum hypertension    not being treated now   SVT (supraventricular tachycardia)    UTI (lower urinary tract infection)    Yeast infection     ROS:   All systems reviewed and negative except as noted in the HPI.   Past Surgical History:  Procedure Laterality Date   ABDOMINAL HYSTERECTOMY     BUNIONECTOMY  1998   CESAREAN SECTION  2010   CHOLECYSTECTOMY N/A 08/27/2019   Procedure: LAPAROSCOPIC CHOLECYSTECTOMY;  Surgeon: Jovita Kussmaul, MD;  Location: [facility];  Service: General;  Laterality: N/A;   COLONOSCOPY WITH PROPOFOL N/A 08/20/2015   Procedure: COLONOSCOPY WITH PROPOFOL;  Surgeon: Arta Silence, MD;  Location: [facility];  Service: Endoscopy;  Laterality: N/A;   dermoid cyst  removed   2010   DILATION AND CURETTAGE OF UTERUS  12/31/10   ENTEROSCOPY N/A 08/20/2015   Procedure: ENTEROSCOPY;  Surgeon: Arta Silence, MD;  Location: [facility];  Service: Endoscopy;  Laterality: N/A;   FOOT SURGERY     herniated disc surgery  2008   LAPAROSCOPIC ENDOMETRIOSIS FULGURATION     ROBOTIC ASSISTED TOTAL HYSTERECTOMY Bilateral 11/01/2013   Procedure: ROBOTIC ASSISTED TOTAL HYSTERECTOMY WITH BILATERAL SALPINOGECTOMY;  Surgeon: Alwyn Pea, MD;  Location: [facility];  Service: Gynecology;  Laterality: Bilateral;   TONSILLECTOMY  1993     Family History  Problem Relation Age of Onset   Diabetes Mother    Other Father        medical history unknown   Heart disease Maternal Uncle    Hypertension Maternal Grandmother    Diabetes Maternal Grandmother    Heart disease Maternal Grandmother    ADD / ADHD Son      Social History   Socioeconomic History   Marital status: Married    Spouse name: Not on file   Number of children: 2   Years of education: some college   Highest education level: Not on file  Occupational History   Occupation: pattern maker  Tobacco Use   Smoking status: Never   Smokeless tobacco: Never  Vaping Use    Vaping Use: Never used  Substance and Sexual Activity   Alcohol use: Yes    Comment: occasionally   Drug use: No   Sexual activity: Yes    Birth control/protection: Surgical  Other Topics Concern   Not on file  Social History Narrative   Lives at  home with her family.   Right-handed.   1.5 cups caffeine per day.   Social Determinants of Health   Financial Resource Strain: Not on file  Food Insecurity: Not on file  Transportation Needs: Not on file  Physical Activity: Not on file  Stress: Not on file  Social Connections: Not on file  Intimate Partner Violence: Not on file     BP 126/82   Pulse 73   Ht '5\' 4"'$  (1.626 m)   Wt 155 lb (70.3 kg)   LMP 02/13/2012 (Exact Date)   SpO2 97%   BMI 26.61 kg/m   Physical Exam:  Well appearing NAD HEENT: Unremarkable Neck:  No JVD, no thyromegally Lymphatics:  No adenopathy Back:  No CVA tenderness Lungs:  Clear with no wheezes HEART:  Regular rate rhythm, no murmurs, no rubs, no clicks Abd:  soft, positive bowel sounds, no organomegally, no rebound, no guarding Ext:  2 plus pulses, no edema, no cyanosis, no clubbing Skin:  No rashes no nodules Neuro:  CN II through XII intact, motor grossly intact  EKG - nsr  Assess/Plan:  PVC's - her pvc's appear to be originating from the RV or possibly LV outflow tract. She has had improvement with propranolol. I discussed additional treatment options but for now would continue as she is unless her symptoms worsen. She is encouraged to avoid caffeine or ETOH excess.  HTN - her bp is controlled. No change in her meds.  Carleene Overlie Laira Penninger,MD

## 2022-09-07 NOTE — Patient Instructions (Signed)

## 2022-09-07 NOTE — Telephone Encounter (Signed)
Pt scheduled 10/16

## 2022-09-08 NOTE — Addendum Note (Signed)
Addended by: Wadie Lessen on: 09/08/2022 04:20 PM   Modules accepted: Orders

## 2022-09-13 ENCOUNTER — Ambulatory Visit: Payer: BC Managed Care – PPO | Admitting: Psychiatry

## 2022-09-13 ENCOUNTER — Encounter: Payer: Self-pay | Admitting: Psychiatry

## 2022-09-13 DIAGNOSIS — F411 Generalized anxiety disorder: Secondary | ICD-10-CM

## 2022-09-13 MED ORDER — DESVENLAFAXINE SUCCINATE ER 50 MG PO TB24: 50.0000 mg | ORAL_TABLET | Freq: Every day | ORAL | 1 refills | Status: DC

## 2022-09-13 MED ORDER — BUPROPION HCL ER (SR) 150 MG PO TB12: 150.0000 mg | ORAL_TABLET | Freq: Two times a day (BID) | ORAL | 1 refills | Status: DC

## 2022-09-13 NOTE — Progress Notes (Signed)
Madeline Everett [id number] [birth year] 46 y.o.  Subjective:   Patient ID:  Madeline Everett is a 46 y.o. (DOB Feb 04, 1976) female.  Chief Complaint:  Chief Complaint  Patient presents with   Follow-up    Anxiety, depression    HPI Madeline Everett presents to the office today for follow-up of anxiety and depression. She reports that Propranolol was increased due to palpitations. She reports that palpitations were attributed to stress and insomnia. She reports that cardiology does not think that Wellbutrin SR has been contributing to palpitation.   She reports, "I'm not a good sleeper... it's always been that way" since childhood. Declines treatment for insomnia. She reports that she has some anxiety in response to work stress and anticipates this improving in the future. Denies any recent panic attacks. She reports that she has some mood lability and fluctuations in energy. Motivation and concentration have improved with increase in Wellbutrin SR. She has noticed some irritability and attributes this to decrease sleep and needing a vacation. Appetite has been good. She reports eating small amounts throughout the day. Denies SI.   Rare ETOH use. Has one cup of coffee in the morning.   Son will be 63 yo in February. She has some anxiety in response to son talking with a Buyer, retail. Daughter will be 76 yo in November.   Past Psychiatric Medication Trials: Remeron- Nasal congestion Cymbalta- Effective but caused constipation. PVC, SVT  Wellbutrin- reports that this was started for chronic fatigue. Stopped by neurologist due to interaction with another medication. She had Difficulty with Wellbutrin XL(helped and then crashed mid-day) Zoloft- adverse effect. Irritability. Affective dulling. Sexual side effects. Prozac- Effective and then started having HA's with it.  Paxil Lexapro Effexor Pristiq Trintellix Buspar Trazodone Topamax Propranolol     Review of Systems:  Review of  Systems  Cardiovascular:        Less palpitations with increased Propranolol  Musculoskeletal:  Negative for gait problem.  Neurological:        She reports that her headaches have been less  Psychiatric/Behavioral:         Please refer to HPI    Medications: I have reviewed the patient's current medications.  Current Outpatient Medications  Medication Sig Dispense Refill   albuterol (PROVENTIL HFA;VENTOLIN HFA) 108 (90 BASE) MCG/ACT inhaler Inhale 1-2 puffs into the lungs every 6 (six) hours as needed for wheezing or shortness of breath.     b complex vitamins capsule Take 1 capsule by mouth daily.     cholecalciferol (VITAMIN D3) 25 MCG (1000 UNIT) tablet Take 1,000 Units by mouth daily.     docusate sodium (COLACE) 100 MG capsule Take 100 mg by mouth daily as needed for mild constipation.     famotidine (PEPCID) 20 MG tablet Take 20 mg by mouth as needed for heartburn or indigestion.     fluticasone (FLONASE) 50 MCG/ACT nasal spray Place into both nostrils as needed.     Magnesium 250 MG TABS Take 250 mg by mouth every evening.      ondansetron (ZOFRAN-ODT) 4 MG disintegrating tablet Take 4 mg by mouth daily as needed.     Probiotic Product (ALIGN) 4 MG CAPS Take 4 mg by mouth daily.     propranolol ER (INDERAL LA) 60 MG 24 hr capsule Take 1 capsule (60 mg total) by mouth daily. 90 capsule 1   rizatriptan (MAXALT) 10 MG tablet Take 10 mg by mouth as needed for migraine. May repeat in  2 hours if needed     Topiramate ER 150 MG CS24 Take 150 mg by mouth at bedtime.      buPROPion (WELLBUTRIN SR) 150 MG 12 hr tablet Take 1 tablet (150 mg total) by mouth 2 (two) times daily. 180 tablet 1   desvenlafaxine (PRISTIQ) 50 MG 24 hr tablet Take 1 tablet (50 mg total) by mouth daily. 90 tablet 1   FLOVENT HFA 110 MCG/ACT inhaler Inhale 2 puffs into the lungs 2 (two) times daily. (Patient not taking: Reported on 09/13/2022)     No current facility-administered medications for this visit.     Medication Side Effects: Other: Insomnia if she takes second dose of Wellbutrin too late  Allergies:  Allergies  Allergen Reactions   Ciprofloxacin Swelling    SWELLING REACTION UNSPECIFIED    Penicillins Rash and Other (See Comments)    Has patient had a PCN reaction causing immediate rash, facial/tongue/throat swelling, SOB or lightheadedness with hypotension: No Has patient had a PCN reaction causing severe rash involving mucus membranes or skin necrosis: Yes  Has patient had a PCN reaction that required hospitalization No Has patient had a PCN reaction occurring within the last 10 years: Yes     Amoxicillin Rash   Codeine Nausea And Vomiting   Fluoxetine Other (See Comments)    Headaches   Guaifenesin & Derivatives Nausea And Vomiting    GI   Hydrocodone-Acetaminophen Nausea Only   Iodine Rash   Lactose Intolerance (Gi) Other (See Comments)    GI   Lactulose Nausea And Vomiting    GI   Paxil [Paroxetine Hcl] Other (See Comments)    UNSPECIFIED REACTION  [Does not remember]   Percocet [Oxycodone-Acetaminophen] Nausea And Vomiting    Past Medical History:  Diagnosis Date   Acid reflux    Anxiety    Aortic valve disorder    Bicuspid aortic valve - stable by echo 11/2019   Asthma    Atypical chest pain    Chronic fatigue syndrome    Complication of anesthesia    nausea & vomiting and difficult time waking up   Compound nevus 01/2012   dysplastic, melanocytic atypia   Depression    crying spells for a year due to stress of  infertility   Deviated septum    Dr Ruby Cola   Endometriosis    Family history of adverse reaction to anesthesia    sister-n/v   FH: thyroid condition    Gestational hypertension    H/O candidiasis    H/O constipation    H/O dyspareunia    H/O varicella    childhood   Headache(784.0)    migraine topomax   Heart murmur    congential bicuspid aortic valve, mitral valve prolapse   Herniated disc 2008   HTN (hypertension)    IBS  (irritable bowel syndrome)    Infertility, female    Pelvic pain    Pneumonia    hx of   PONV (postoperative nausea and vomiting)    needs Scop patch   Postpartum hypertension    not being treated now   SVT (supraventricular tachycardia)    UTI (lower urinary tract infection)    Yeast infection     Past Medical History, Surgical history, Social history, and Family history were reviewed and updated as appropriate.   Please see review of systems for further details on the patient's review from today.   Objective:   Physical Exam:  LMP 02/13/2012 (Exact Date)  Physical Exam Constitutional:      General: She is not in acute distress. Musculoskeletal:        General: No deformity.  Neurological:     Mental Status: She is alert and oriented to person, place, and time.     Coordination: Coordination normal.  Psychiatric:        Attention and Perception: Attention and perception normal. She does not perceive auditory or visual hallucinations.        Mood and Affect: Mood is not depressed. Affect is labile. Affect is not blunt, angry or inappropriate.        Speech: Speech normal.        Behavior: Behavior normal.        Thought Content: Thought content normal. Thought content is not paranoid or delusional. Thought content does not include homicidal or suicidal ideation. Thought content does not include homicidal or suicidal plan.        Cognition and Memory: Cognition and memory normal.        Judgment: Judgment normal.     Comments: Insight intact She reports mood is anxious in response to several recent acute stressors     Lab Review:     Component Value Date/Time   NA 141 07/14/2022 1507   K 4.3 07/14/2022 1507   CL 107 (H) 07/14/2022 1507   CO2 18 (L) 07/14/2022 1507   GLUCOSE 80 07/14/2022 1507   GLUCOSE 117 (H) 08/25/2020 1903   BUN 12 07/14/2022 1507   CREATININE 0.92 07/14/2022 1507   CREATININE 0.85 08/04/2016 0944   CALCIUM 9.2 07/14/2022 1507   PROT 7.3  08/25/2020 1903   ALBUMIN 4.3 08/25/2020 1903   AST 21 08/25/2020 1903   ALT 27 08/25/2020 1903   ALKPHOS 58 08/25/2020 1903   BILITOT 0.5 08/25/2020 1903   GFRNONAA >60 08/25/2020 1903   GFRAA >60 08/25/2020 1903       Component Value Date/Time   WBC 8.8 06/28/2022 0941   WBC 13.2 (H) 08/25/2020 1903   RBC 5.03 06/28/2022 0941   RBC 5.33 (H) 08/25/2020 1903   HGB 14.7 06/28/2022 0941   HCT 43.1 06/28/2022 0941   PLT 286 06/28/2022 0941   MCV 86 06/28/2022 0941   MCH 29.2 06/28/2022 0941   MCH 29.1 08/25/2020 1903   MCHC 34.1 06/28/2022 0941   MCHC 33.0 08/25/2020 1903   RDW 12.7 06/28/2022 0941   LYMPHSABS 2.3 08/25/2020 1903   MONOABS 0.6 08/25/2020 1903   EOSABS 0.0 08/25/2020 1903   BASOSABS 0.0 08/25/2020 1903    No results found for: "POCLITH", "LITHIUM"   No results found for: "PHENYTOIN", "PHENOBARB", "VALPROATE", "CBMZ"   .res Assessment: Plan:   Pt seen for 30 minutes and time spent discussing her concern about fluctuations in her mood and energy and possible treatment options. Discussed that Lamictal is used off-label for depression without bipolar disorder. Counseled patient regarding potential benefits, risks, and side effects of Lamictal to include potential risk of Stevens-Johnson syndrome. She reports that she would like to think about this treatment option and observe how her mood progresses over the next couple of weeks when she anticipates stressors improving. Discussed that she could contact office if she would like to start Lamictal in the future. Would start Lamictal 25 mg daily for 2 weeks, then increase to 50 mg daily for 2 weeks, then 100 mg daily for 2 weeks, then 150 mg daily for mood symptoms.  Continue Pristiq 50 mg daily for anxiety and  depression.  Continue Wellbutrin SR 150 mg twice daily for depression.  Pt to follow-up in 6 months or sooner if clinically indicated.  Patient advised to contact office with any questions, adverse effects, or  acute worsening in signs and symptoms.   Madeline Everett was seen today for follow-up.  Diagnoses and all orders for this visit:  Generalized anxiety disorder -     desvenlafaxine (PRISTIQ) 50 MG 24 hr tablet; Take 1 tablet (50 mg total) by mouth daily.  Other orders -     buPROPion (WELLBUTRIN SR) 150 MG 12 hr tablet; Take 1 tablet (150 mg total) by mouth 2 (two) times daily.     Please see After Visit Summary for patient specific instructions.  Future Appointments  Date Time Provider [facility]  09/29/2022  9:00 AM [facility]  09/29/2022  1:30 PM Charlie Pitter, PA-C [facility]  11/12/2022  8:20 AM Sueanne Margarita, MD [facility]  03/14/2023  9:30 AM Thayer Headings, PMHNP [facility] None    No orders of the defined types were placed in this encounter.   -------------------------------

## 2022-09-27 ENCOUNTER — Encounter: Payer: Self-pay | Admitting: Physician Assistant

## 2022-09-27 NOTE — Progress Notes (Unsigned)
Cardiology Office Note    Date:  09/29/2022   ID:  Madeline Everett, DOB [birth year], MRN [id number]  PCP:  Harlan Stains, MD  Cardiologist:  Fransico Him, MD  Electrophysiologist:  None   Chief Complaint: f/u PVCs  History of Present Illness:   Madeline Everett is a 46 y.o. female with history of hypertension, SVT, bicuspid aortic valve, acid reflux, anxiety, depression, IBS, and recent frequent PVCs who is seen for follow-up.  She previously established care w/ Dr. Radford Pax for palpitations and known bicuspid AV. Remote NST 2015 was normal. cMRI 2017 showed normal EF, normal aorta, bicuspid AV w/o stenosis or regurgitation. Last echo 11/2021 showed EF 52%, G1DD, normal RV, mild MR, AV not well visualized but no AI/AS. She has also worn several prior monitors in 2017 (felt benign - NSR/avg 85 bpm with occasional PACs/PVCs) and 2021 (felt normal without significant arrhythmias, avg 91bpm). The diagnosis of SVT pre-dated these monitors. The latter was obtained in the context of having spells where all of a sudden she could not control her body and was referred to neurology. Dr. Jannifer Franklin diagnosed her with vasovagal spells and adjusted her anxiety medication. There were no corresponding arrhythmias on her monitor. However, more recently she developed worsening palpitations this summer prompting repeat monitor. She saw Finis Bud 05/2022 when monitor was still in progress. As short-acting propranolol had been helpful, she was was transitioned to long-acting propranolol. Monitor resulted with frequent PVCs (16.1%) as well as 1 run NSVT (8bts) and 1 run SVT (5 beats), rare PACs. Dr. Radford Pax recommended repeat cMRI and EP evaluation. She saw Dr. Lovena Le who recommended to continue the propranolol and reserve additional therapy for any worsening symptoms. Cardiac MRI prelim read by Dr. Johnsie Cancel showed normal LV size, no delayed gad uptake, no RV dysplasia, no significant AR, mild MR, normal thoracic aorta.   She  is seen for follow-up and feels as though she is doing well on the propranolol. She reports a long history of intermittent exertional dyspnea (some days easier to go up stairs than others) but reports this was present way back to when she'd had her normal stress test. She reports this has improved as well. No chest pain or syncope. Her palpitations are much less frequent as well, back to prior baseline. She also now has identified her triggers of stress and increased caffeine and tries to limit these. Her son was screened with echo and did not have a bicuspid AV but she inquired about her 48 year old daughter being checked. We discussed that our office only sees adult cardiology and this would have to be ordered through her pediatrician.     Labwork independently reviewed: 06/2022 K 4.3, Cr 0.92 05/2022 Mg 2.2, CBC and TSH ok Of note, chloride frequently borderline up and CO2 down just by a point   Cardiology Studies:   Studies reviewed are outlined and summarized above. Reports included below if pertinent.   06/2022 Monitor   Predominant rhythm is normal sinus rhythm with average heart rate 83bpm and ranged from 55 to 200bpm.   Frequent PVCs, bigeminal and trigeminal PVCs, vetricular couplets, triplets and nonsustained ventricular tachycardia up to 8 beats at 200bpm.  PVC load 16.1%.     Patch Wear Time:  13 days and 12 hours (2023-07-28T09:39:19-0400 to 2023-08-10T21:50:13-0400)   Patient had a min HR of 55 bpm, max HR of 200 bpm, and avg HR of 83 bpm. Predominant underlying rhythm was Sinus Rhythm. 1 run of Ventricular Tachycardia occurred  lasting 8 beats with a max rate of 200 bpm (avg 153 bpm). 1 run of Supraventricular  Tachycardia occurred lasting 5 beats with a max rate of 158 bpm (avg 133 bpm). Isolated SVEs were rare (<1.0%), SVE Couplets were rare (<1.0%), and no SVE Triplets were present. Isolated VEs were frequent (16.1%, [id number]), VE Couplets were rare (<1.0%,  115), and VE Triplets  were rare (<1.0%, 4). Ventricular Bigeminy and Trigeminy were present.   Echo 11/2021   1. Left ventricular ejection fraction by 3D volume is 52 %. The left  ventricle has low normal function. The left ventricle has no regional wall  motion abnormalities. Left ventricular diastolic parameters are consistent  with Grade I diastolic dysfunction   (impaired relaxation). The average left ventricular global longitudinal  strain is -19.2 %. The global longitudinal strain is normal.   2. Right ventricular systolic function is normal. The right ventricular  size is normal.   3. The mitral valve is grossly normal. Mild mitral valve regurgitation.  No evidence of mitral stenosis.   4. Aortic valve previously described as bicuspid. The aortic valve was  not well visualized. Aortic valve regurgitation is not visualized. No  aortic stenosis is present.   5. The inferior vena cava is normal in size with greater than 50%  respiratory variability, suggesting right atrial pressure of 3 mmHg.   Comparison(s): 12/24/19 EF 55%. GLS -18.4%.     Past Medical History:  Diagnosis Date   Acid reflux    Anxiety    Aortic valve disorder    Bicuspid aortic valve - stable by echo 11/2019   Asthma    Atypical chest pain    Chronic fatigue syndrome    Complication of anesthesia    nausea & vomiting and difficult time waking up   Compound nevus 01/2012   dysplastic, melanocytic atypia   Depression    crying spells for a year due to stress of  infertility   Deviated septum    Dr Ruby Cola   Endometriosis    Family history of adverse reaction to anesthesia    sister-n/v   FH: thyroid condition    Frequent PVCs    Gestational hypertension    H/O candidiasis    H/O constipation    H/O dyspareunia    H/O varicella    childhood   Headache(784.0)    migraine topomax   Heart murmur    congential bicuspid aortic valve, mitral valve prolapse   Herniated disc 2008   HTN (hypertension)    IBS  (irritable bowel syndrome)    Infertility, female    Pelvic pain    Pneumonia    hx of   PONV (postoperative nausea and vomiting)    needs Scop patch   Postpartum hypertension    not being treated now   SVT (supraventricular tachycardia)    UTI (lower urinary tract infection)    Yeast infection     Past Surgical History:  Procedure Laterality Date   ABDOMINAL HYSTERECTOMY     BUNIONECTOMY  1998   CESAREAN SECTION  2010   CHOLECYSTECTOMY N/A 08/27/2019   Procedure: LAPAROSCOPIC CHOLECYSTECTOMY;  Surgeon: Jovita Kussmaul, MD;  Location: [facility];  Service: General;  Laterality: N/A;   COLONOSCOPY WITH PROPOFOL N/A 08/20/2015   Procedure: COLONOSCOPY WITH PROPOFOL;  Surgeon: Arta Silence, MD;  Location: [facility];  Service: Endoscopy;  Laterality: N/A;   dermoid cyst  removed   2010   DILATION AND CURETTAGE OF UTERUS  12/31/10   ENTEROSCOPY N/A 08/20/2015   Procedure: ENTEROSCOPY;  Surgeon: Arta Silence, MD;  Location: [facility];  Service: Endoscopy;  Laterality: N/A;   FOOT SURGERY     herniated disc surgery  2008   LAPAROSCOPIC ENDOMETRIOSIS FULGURATION     ROBOTIC ASSISTED TOTAL HYSTERECTOMY Bilateral 11/01/2013   Procedure: ROBOTIC ASSISTED TOTAL HYSTERECTOMY WITH BILATERAL SALPINOGECTOMY;  Surgeon: Alwyn Pea, MD;  Location: [facility];  Service: Gynecology;  Laterality: Bilateral;   TONSILLECTOMY  1993    Current Medications: Current Meds  Medication Sig   albuterol (PROVENTIL HFA;VENTOLIN HFA) 108 (90 BASE) MCG/ACT inhaler Inhale 1-2 puffs into the lungs every 6 (six) hours as needed for wheezing or shortness of breath.   b complex vitamins capsule Take 1 capsule by mouth daily.   buPROPion (WELLBUTRIN SR) 150 MG 12 hr tablet Take 1 tablet (150 mg total) by mouth 2 (two) times daily.   cholecalciferol (VITAMIN D3) 25 MCG (1000 UNIT) tablet Take 1,000 Units by mouth daily.   desvenlafaxine (PRISTIQ) 50 MG 24 hr tablet Take 1 tablet (50 mg total) by mouth daily.    docusate sodium (COLACE) 100 MG capsule Take 100 mg by mouth daily as needed for mild constipation.   famotidine (PEPCID) 20 MG tablet Take 20 mg by mouth as needed for heartburn or indigestion.   FLOVENT HFA 110 MCG/ACT inhaler Inhale 2 puffs into the lungs 2 (two) times daily.   fluticasone (FLONASE) 50 MCG/ACT nasal spray Place into both nostrils as needed.   Magnesium 250 MG TABS Take 250 mg by mouth every evening.    ondansetron (ZOFRAN-ODT) 4 MG disintegrating tablet Take 4 mg by mouth daily as needed.   Probiotic Product (ALIGN) 4 MG CAPS Take 4 mg by mouth daily.   propranolol ER (INDERAL LA) 60 MG 24 hr capsule Take 1 capsule (60 mg total) by mouth daily.   rizatriptan (MAXALT) 10 MG tablet Take 10 mg by mouth as needed for migraine. May repeat in 2 hours if needed   Topiramate ER 150 MG CS24 Take 150 mg by mouth at bedtime.       Allergies:   Ciprofloxacin, Penicillins, Amoxicillin, Codeine, Fluoxetine, Guaifenesin & derivatives, Hydrocodone-acetaminophen, Iodine, Lactose intolerance (gi), Lactulose, Paxil [paroxetine hcl], and Percocet [oxycodone-acetaminophen]   Social History   Socioeconomic History   Marital status: Married    Spouse name: Not on file   Number of children: 2   Years of education: some college   Highest education level: Not on file  Occupational History   Occupation: pattern Agricultural engineer  Tobacco Use   Smoking status: Never   Smokeless tobacco: Never  Vaping Use   Vaping Use: Never used  Substance and Sexual Activity   Alcohol use: Yes    Comment: occasionally   Drug use: No   Sexual activity: Yes    Birth control/protection: Surgical  Other Topics Concern   Not on file  Social History Narrative   Lives at home with her family.   Right-handed.   1.5 cups caffeine per day.   Social Determinants of Health   Financial Resource Strain: Not on file  Food Insecurity: Not on file  Transportation Needs: Not on file  Physical Activity: Not on file   Stress: Not on file  Social Connections: Not on file     Family History:  The patient's family history includes ADD / ADHD in her son; Diabetes in her maternal grandmother and mother; Heart disease in her maternal grandmother and  maternal uncle; Hypertension in her maternal grandmother; Other in her father.  ROS:   Please see the history of present illness.  All other systems are reviewed and otherwise negative.    EKG(s)/Additional Labs   EKG:  EKG is not ordered today  Recent Labs: 06/28/2022: Hemoglobin 14.7; Magnesium 2.2; Platelets 286; TSH 2.400 07/14/2022: BUN 12; Creatinine, Ser 0.92; Potassium 4.3; Sodium 141  Recent Lipid Panel No results found for: "CHOL", "TRIG", "HDL", "CHOLHDL", "VLDL", "LDLCALC", "LDLDIRECT"  PHYSICAL EXAM:    VS:  BP 102/78   Pulse 78   Ht '5\' 4"'$  (1.626 m)   Wt 159 lb (72.1 kg)   LMP 02/13/2012 (Exact Date)   SpO2 98%   BMI 27.29 kg/m   BMI: Body mass index is 27.29 kg/m.  GEN: Well nourished, well developed female in no acute distress HEENT: normocephalic, atraumatic Neck: no JVD, carotid bruits, or masses Cardiac: RRR; no murmurs, rubs, or gallops, no edema  Respiratory:  clear to auscultation bilaterally, normal work of breathing GI: soft, nontender, nondistended, + BS MS: no deformity or atrophy Skin: warm and dry, no rash Neuro:  Alert and Oriented x 3, Strength and sensation are intact, follows commands Psych: euthymic mood, full affect  Wt Readings from Last 3 Encounters:  09/29/22 159 lb (72.1 kg)  09/07/22 155 lb (70.3 kg)  06/28/22 155 lb (70.3 kg)     ASSESSMENT & PLAN:   1. Frequent PVCs - improved on the propranolol, she will continue. She inquired about potentially using short-acting for any breakthrough symptoms. We discussed this is reasonable and that the max dose of propranolol per day is '160mg'$  though she does not anticipate having to take more than 1 tablet as needed for breakthrough.  2. H/o PACs, NSVT, PSVT -  improved on BB therapy. Lytes OK by last check. No hx of syncope.  3. Bicuspid aortic valve - reviewed preliminary cMRI results with patient. No significant abnormalities noted aside from mild MR. Last echo 11/2021 showed no AI/AS. Anticipate repeat echo in 3-5 years, sooner if dictated by symptoms, can be ordered closer to that time. Of note thoracic aorta was normal in size.  4. Mild mitral regurgitation - no specific intervention needed at this time. We would consider f/u echo at the interval above anyway, anticipate this finding will also be serially monitored as well.    Disposition: F/u with Dr. Radford Pax in 1 year, sooner if needed. She inquired whether she needed to keep f/u in December. I told her it was OK to cancel the December appointment since she is doing better and has had recent f/u with our APP team. Will cc copy of note to Dr. Radford Pax. She would like to return in 1 year. She will notify for any accelerating symptoms. She will also f/u with Dr. Lovena Le yearly as well.   Medication Adjustments/Labs and Tests Ordered: Current medicines are reviewed at length with the patient today.  Concerns regarding medicines are outlined above. Medication changes, Labs and Tests ordered today are summarized above and listed in the Patient Instructions accessible in Encounters.   Signed, Charlie Pitter, PA-C  09/29/2022 2:07 PM    [place] Phone: [phone]; Fax: [phone]

## 2022-09-28 ENCOUNTER — Telehealth (HOSPITAL_COMMUNITY): Payer: Self-pay | Admitting: *Deleted

## 2022-09-28 NOTE — Telephone Encounter (Signed)
Attempted to call patient regarding upcoming cardiac MRI appointment. Left message on voicemail with name and callback number  Hollyanne Schloesser RN Navigator Cardiac Imaging [facility] [phone] Office [phone] Cell  

## 2022-09-28 NOTE — Telephone Encounter (Signed)
Patient returning call regarding upcoming cardiac imaging study; pt verbalizes understanding of appt date/time, parking situation and where to check in,  and verified current allergies; name and call back number provided for further questions should they arise  Gordy Clement RN Navigator Cardiac Imaging [facility] [phone] office [phone] cell  Patient reports having screws in foot but has MRIs in the past without incident. Denies claustrophobia.

## 2022-09-29 ENCOUNTER — Telehealth: Payer: Self-pay | Admitting: Cardiology

## 2022-09-29 ENCOUNTER — Other Ambulatory Visit: Payer: Self-pay | Admitting: Cardiology

## 2022-09-29 ENCOUNTER — Ambulatory Visit (HOSPITAL_COMMUNITY)
Admission: RE | Admit: 2022-09-29 | Discharge: 2022-09-29 | Disposition: A | Discharge status: Home / Self Care | Payer: BC Managed Care – PPO | Source: Ambulatory Visit | Attending: Cardiology | Admitting: Cardiology

## 2022-09-29 ENCOUNTER — Encounter: Payer: Self-pay | Admitting: Physician Assistant

## 2022-09-29 ENCOUNTER — Ambulatory Visit: Payer: BC Managed Care – PPO | Admitting: Physician Assistant

## 2022-09-29 VITALS — BP 102/78 | HR 78 | Ht 64.0 in | Wt 159.0 lb

## 2022-09-29 DIAGNOSIS — I340 Nonrheumatic mitral (valve) insufficiency: Secondary | ICD-10-CM | POA: Diagnosis not present

## 2022-09-29 DIAGNOSIS — Q231 Congenital insufficiency of aortic valve: Secondary | ICD-10-CM | POA: Insufficient documentation

## 2022-09-29 DIAGNOSIS — I471 Supraventricular tachycardia, unspecified: Secondary | ICD-10-CM | POA: Diagnosis not present

## 2022-09-29 DIAGNOSIS — R55 Syncope and collapse: Secondary | ICD-10-CM | POA: Insufficient documentation

## 2022-09-29 DIAGNOSIS — R002 Palpitations: Secondary | ICD-10-CM | POA: Diagnosis not present

## 2022-09-29 DIAGNOSIS — I4729 Other ventricular tachycardia: Secondary | ICD-10-CM

## 2022-09-29 DIAGNOSIS — I493 Ventricular premature depolarization: Secondary | ICD-10-CM | POA: Insufficient documentation

## 2022-09-29 DIAGNOSIS — I491 Atrial premature depolarization: Secondary | ICD-10-CM

## 2022-09-29 MED ORDER — GADOBUTROL 1 MMOL/ML IV SOLN
7.0000 mL | Freq: Once | INTRAVENOUS | Status: AC | PRN
  Administered 2022-09-29: 7 mL via INTRAVENOUS

## 2022-09-29 NOTE — Telephone Encounter (Signed)
-----   Message from Sueanne Margarita, MD sent at 09/29/2022 12:18 PM EDT ----- Essentially normal cMRI with mild MR and bicuspid AV which had already been noted on prior echo

## 2022-09-29 NOTE — Telephone Encounter (Signed)
Pt is returning call in regards to results 

## 2022-09-29 NOTE — Telephone Encounter (Signed)
The patient has been notified of the result and verbalized understanding.  All questions (if any) were answered. Bernestine Amass, RN 09/29/2022 1:05 PM

## 2022-09-29 NOTE — Patient Instructions (Signed)
Medication Instructions:  Your physician recommends that you continue on your current medications as directed. Please refer to the Current Medication list given to you today. *If you need a refill on your cardiac medications before your next appointment, please call your pharmacy*   Lab Work: None ordered    Testing/Procedures: None ordered   Follow-Up: At [facility], you and your health needs are our priority.  As part of our continuing mission to provide you with exceptional heart care, we have created designated Provider Care Teams.  These Care Teams include your primary Cardiologist (physician) and Advanced Practice Providers (APPs -  Physician Assistants and Nurse Practitioners) who all work together to provide you with the care you need, when you need it.  We recommend signing up for the patient portal called "MyChart".  Sign up information is provided on this After Visit Summary.  MyChart is used to connect with patients for Virtual Visits (Telemedicine).  Patients are able to view lab/test results, encounter notes, upcoming appointments, etc.  Non-urgent messages can be sent to your provider as well.   To learn more about what you can do with MyChart, go to [email or web address].    Your next appointment:   12 month(s)  The format for your next appointment:   In Person  Provider:   Fransico Him, MD     Other Instructions   Important Information About Sugar

## 2022-11-09 DIAGNOSIS — G43719 Chronic migraine without aura, intractable, without status migrainosus: Secondary | ICD-10-CM | POA: Diagnosis not present

## 2022-11-09 DIAGNOSIS — M542 Cervicalgia: Secondary | ICD-10-CM | POA: Diagnosis not present

## 2022-11-12 ENCOUNTER — Ambulatory Visit: Payer: BC Managed Care – PPO | Admitting: Cardiology

## 2022-12-23 ENCOUNTER — Other Ambulatory Visit: Payer: Self-pay | Admitting: Nurse Practitioner

## 2023-01-18 DIAGNOSIS — Z1283 Encounter for screening for malignant neoplasm of skin: Secondary | ICD-10-CM | POA: Diagnosis not present

## 2023-01-18 DIAGNOSIS — D225 Melanocytic nevi of trunk: Secondary | ICD-10-CM | POA: Diagnosis not present

## 2023-01-18 DIAGNOSIS — D485 Neoplasm of uncertain behavior of skin: Secondary | ICD-10-CM | POA: Diagnosis not present

## 2023-01-18 DIAGNOSIS — L821 Other seborrheic keratosis: Secondary | ICD-10-CM | POA: Diagnosis not present

## 2023-03-10 DIAGNOSIS — D3121 Benign neoplasm of right retina: Secondary | ICD-10-CM | POA: Diagnosis not present

## 2023-03-10 DIAGNOSIS — D3122 Benign neoplasm of left retina: Secondary | ICD-10-CM | POA: Diagnosis not present

## 2023-03-14 ENCOUNTER — Ambulatory Visit: Payer: BC Managed Care – PPO | Admitting: Psychiatry

## 2023-03-14 ENCOUNTER — Encounter: Payer: Self-pay | Admitting: Psychiatry

## 2023-03-14 DIAGNOSIS — F411 Generalized anxiety disorder: Secondary | ICD-10-CM | POA: Diagnosis not present

## 2023-03-14 MED ORDER — DESVENLAFAXINE SUCCINATE ER 50 MG PO TB24
50.0000 mg | ORAL_TABLET | Freq: Every day | ORAL | 1 refills | Status: DC
Start: 2023-03-14 — End: 2023-09-09

## 2023-03-14 MED ORDER — BUPROPION HCL ER (SR) 150 MG PO TB12: 150.0000 mg | ORAL_TABLET | Freq: Two times a day (BID) | ORAL | 1 refills | Status: DC

## 2023-03-14 NOTE — Progress Notes (Signed)
Madeline Everett [id number] [birth year] 47 y.o.  Subjective:   Patient ID:  Madeline Everett is a 47 y.o. (DOB March 01, 1976) female.  Chief Complaint:  Chief Complaint  Patient presents with   Follow-up    Anxiety and depression    HPI Madeline Everett presents to the office today for follow-up of anxiety and depression. She reports, "I'm doing ok." She reports gaining 5 lbs in the last 3 weeks. She downloaded Noom. She reports that she is eating less than 1200 Kcal a day. Appetite is fair. She reports that she started Perimenopause last year.   She is trying to leave by 5 pm daily to avoid working excessively. She reports that she is going for walks a few times a day. She notices if she does not take second dose of Wellbutrin SR her motivation is lower. She reports that she continues to "speak every thought in my head." She reports that interrupting has been lifelong. She reports that she has difficulty with being patient during meetings. Denies significant depression as long as she takes her Wellbutrin as prescribed. She reports that she is now setting an alarm to take her medication and leaves a bottle in her desk drawer. She reports chronic irritability- "it's actually gotten a lot better." She reports that she has delegated some of her responsibilities and this has helped with her irritability and anxiety. She reports improved anxiety. She reports that her concentration has been "better." She will put headphones on at times to minimize distractions. She reports that she is not sleeping well. Husband has been telling her she is snoring and sounds more congested. She is seeing ENT soon about surgery for deviated nasal septum surgery. Denies SI.   She reports that she did not start Lamictal- "I think I am doing ok."   Son decided not to join the [employer].    Past Psychiatric Medication Trials: Remeron- Nasal congestion Cymbalta- Effective but caused constipation. PVC, SVT  Wellbutrin- reports  that this was started for chronic fatigue. Stopped by neurologist due to interaction with another medication. She had Difficulty with Wellbutrin XL(helped and then crashed mid-day) Zoloft- adverse effect. Irritability. Affective dulling. Sexual side effects. Prozac- Effective and then started having HA's with it.  Paxil Lexapro Effexor Pristiq Trintellix Buspar Trazodone Topamax Propranolol    Review of Systems:  Review of Systems  HENT:  Positive for congestion.   Musculoskeletal:  Negative for gait problem.  Neurological:        Hands and fingers tingle on occ  Psychiatric/Behavioral:         Please refer to HPI    Medications: I have reviewed the patient's current medications.  Current Outpatient Medications  Medication Sig Dispense Refill   albuterol (PROVENTIL HFA;VENTOLIN HFA) 108 (90 BASE) MCG/ACT inhaler Inhale 1-2 puffs into the lungs every 6 (six) hours as needed for wheezing or shortness of breath.     b complex vitamins capsule Take 1 capsule by mouth daily.     cholecalciferol (VITAMIN D3) 25 MCG (1000 UNIT) tablet Take 1,000 Units by mouth daily.     docusate sodium (COLACE) 100 MG capsule Take 100 mg by mouth daily as needed for mild constipation.     famotidine (PEPCID) 20 MG tablet Take 20 mg by mouth as needed for heartburn or indigestion.     FLOVENT HFA 110 MCG/ACT inhaler Inhale 2 puffs into the lungs 2 (two) times daily.     fluticasone (FLONASE) 50 MCG/ACT nasal spray Place into  both nostrils as needed.     Magnesium 250 MG TABS Take 250 mg by mouth every evening.      ondansetron (ZOFRAN-ODT) 4 MG disintegrating tablet Take 4 mg by mouth daily as needed.     Probiotic Product (ALIGN) 4 MG CAPS Take 4 mg by mouth daily.     propranolol ER (INDERAL LA) 60 MG 24 hr capsule TAKE 1 CAPSULE BY MOUTH EVERY DAY 90 capsule 3   rizatriptan (MAXALT) 10 MG tablet Take 10 mg by mouth as needed for migraine. May repeat in 2 hours if needed     Topiramate ER 150 MG  CS24 Take 150 mg by mouth at bedtime.      buPROPion (WELLBUTRIN SR) 150 MG 12 hr tablet Take 1 tablet (150 mg total) by mouth 2 (two) times daily. 180 tablet 1   desvenlafaxine (PRISTIQ) 50 MG 24 hr tablet Take 1 tablet (50 mg total) by mouth daily. 90 tablet 1   No current facility-administered medications for this visit.    Medication Side Effects: None  Allergies:  Allergies  Allergen Reactions   Ciprofloxacin Swelling    SWELLING REACTION UNSPECIFIED    Penicillins Rash and Other (See Comments)    Has patient had a PCN reaction causing immediate rash, facial/tongue/throat swelling, SOB or lightheadedness with hypotension: No Has patient had a PCN reaction causing severe rash involving mucus membranes or skin necrosis: Yes  Has patient had a PCN reaction that required hospitalization No Has patient had a PCN reaction occurring within the last 10 years: Yes     Amoxicillin Rash   Codeine Nausea And Vomiting   Fluoxetine Other (See Comments)    Headaches   Guaifenesin & Derivatives Nausea And Vomiting    GI   Hydrocodone-Acetaminophen Nausea Only   Iodine Rash   Lactose Intolerance (Gi) Other (See Comments)    GI   Lactulose Nausea And Vomiting    GI   Paxil [Paroxetine Hcl] Other (See Comments)    UNSPECIFIED REACTION  [Does not remember]   Percocet [Oxycodone-Acetaminophen] Nausea And Vomiting    Past Medical History:  Diagnosis Date   Acid reflux    Anxiety    Aortic valve disorder    Bicuspid aortic valve - stable by echo 11/2019   Asthma    Atypical chest pain    Chronic fatigue syndrome    Complication of anesthesia    nausea & vomiting and difficult time waking up   Compound nevus 01/2012   dysplastic, melanocytic atypia   Depression    crying spells for a year due to stress of  infertility   Deviated septum    Dr Melvenia Beam   Endometriosis    Family history of adverse reaction to anesthesia    sister-n/v   FH: thyroid condition    Frequent PVCs     Gestational hypertension    H/O candidiasis    H/O constipation    H/O dyspareunia    H/O varicella    childhood   Headache(784.0)    migraine topomax   Heart murmur    congential bicuspid aortic valve, mitral valve prolapse   Herniated disc 2008   HTN (hypertension)    IBS (irritable bowel syndrome)    Infertility, female    Pelvic pain    Pneumonia    hx of   PONV (postoperative nausea and vomiting)    needs Scop patch   Postpartum hypertension    not being treated now  SVT (supraventricular tachycardia)    UTI (lower urinary tract infection)    Yeast infection     Past Medical History, Surgical history, Social history, and Family history were reviewed and updated as appropriate.   Please see review of systems for further details on the patient's review from today.   Objective:   Physical Exam:  LMP 02/13/2012 (Exact Date)   Physical Exam Constitutional:      General: She is not in acute distress. Musculoskeletal:        General: No deformity.  Neurological:     Mental Status: She is alert and oriented to person, place, and time.     Coordination: Coordination normal.  Psychiatric:        Attention and Perception: Attention and perception normal. She does not perceive auditory or visual hallucinations.        Mood and Affect: Mood normal. Mood is not anxious or depressed. Affect is not labile, blunt, angry or inappropriate.        Speech: Speech normal.        Behavior: Behavior normal.        Thought Content: Thought content normal. Thought content is not paranoid or delusional. Thought content does not include homicidal or suicidal ideation. Thought content does not include homicidal or suicidal plan.        Cognition and Memory: Cognition and memory normal.        Judgment: Judgment normal.     Comments: Insight intact     Lab Review:     Component Value Date/Time   NA 141 07/14/2022 1507   K 4.3 07/14/2022 1507   CL 107 (H) 07/14/2022 1507   CO2  18 (L) 07/14/2022 1507   GLUCOSE 80 07/14/2022 1507   GLUCOSE 117 (H) 08/25/2020 1903   BUN 12 07/14/2022 1507   CREATININE 0.92 07/14/2022 1507   CREATININE 0.85 08/04/2016 0944   CALCIUM 9.2 07/14/2022 1507   PROT 7.3 08/25/2020 1903   ALBUMIN 4.3 08/25/2020 1903   AST 21 08/25/2020 1903   ALT 27 08/25/2020 1903   ALKPHOS 58 08/25/2020 1903   BILITOT 0.5 08/25/2020 1903   GFRNONAA >60 08/25/2020 1903   GFRAA >60 08/25/2020 1903       Component Value Date/Time   WBC 8.8 06/28/2022 0941   WBC 13.2 (H) 08/25/2020 1903   RBC 5.03 06/28/2022 0941   RBC 5.33 (H) 08/25/2020 1903   HGB 14.7 06/28/2022 0941   HCT 43.1 06/28/2022 0941   PLT 286 06/28/2022 0941   MCV 86 06/28/2022 0941   MCH 29.2 06/28/2022 0941   MCH 29.1 08/25/2020 1903   MCHC 34.1 06/28/2022 0941   MCHC 33.0 08/25/2020 1903   RDW 12.7 06/28/2022 0941   LYMPHSABS 2.3 08/25/2020 1903   MONOABS 0.6 08/25/2020 1903   EOSABS 0.0 08/25/2020 1903   BASOSABS 0.0 08/25/2020 1903    No results found for: "POCLITH", "LITHIUM"   No results found for: "PHENYTOIN", "PHENOBARB", "VALPROATE", "CBMZ"   .res Assessment: Plan:    Pt seen for 25 minutes and time spent discussing response to treatment plan. She reports that overall her mood, anxiety, and concentration have been well-controlled. Will continue current plan of care since target signs and symptoms are well controlled without any tolerability issues. Continue Pristiq 50 mg po qd for anxiety and depression.  Will continue Wellbutrin SR 150 mg po BID for energy, motivation, and concentration. Pt to follow-up in 6 months or sooner if clinically indicated.  Patient  advised to contact office with any questions, adverse effects, or acute worsening in signs and symptoms.    Madeline Everett was seen today for follow-up.  Diagnoses and all orders for this visit:  Generalized anxiety disorder -     desvenlafaxine (PRISTIQ) 50 MG 24 hr tablet; Take 1 tablet (50 mg total) by  mouth daily.  Other orders -     buPROPion (WELLBUTRIN SR) 150 MG 12 hr tablet; Take 1 tablet (150 mg total) by mouth 2 (two) times daily.     Please see After Visit Summary for patient specific instructions.  Future Appointments  Date Time Provider Department Center  09/09/2023  9:30 AM Corie Chiquito, PMHNP [facility] None    No orders of the defined types were placed in this encounter.   -------------------------------

## 2023-04-21 DIAGNOSIS — R0683 Snoring: Secondary | ICD-10-CM | POA: Diagnosis not present

## 2023-04-21 DIAGNOSIS — J3489 Other specified disorders of nose and nasal sinuses: Secondary | ICD-10-CM | POA: Diagnosis not present

## 2023-04-21 DIAGNOSIS — J339 Nasal polyp, unspecified: Secondary | ICD-10-CM | POA: Diagnosis not present

## 2023-04-29 DIAGNOSIS — R0683 Snoring: Secondary | ICD-10-CM | POA: Diagnosis not present

## 2023-04-29 DIAGNOSIS — J339 Nasal polyp, unspecified: Secondary | ICD-10-CM | POA: Diagnosis not present

## 2023-04-29 DIAGNOSIS — J329 Chronic sinusitis, unspecified: Secondary | ICD-10-CM | POA: Diagnosis not present

## 2023-04-29 DIAGNOSIS — J3489 Other specified disorders of nose and nasal sinuses: Secondary | ICD-10-CM | POA: Diagnosis not present

## 2023-05-11 DIAGNOSIS — Q231 Congenital insufficiency of aortic valve: Secondary | ICD-10-CM | POA: Diagnosis not present

## 2023-05-11 DIAGNOSIS — G43909 Migraine, unspecified, not intractable, without status migrainosus: Secondary | ICD-10-CM | POA: Diagnosis not present

## 2023-05-11 DIAGNOSIS — E785 Hyperlipidemia, unspecified: Secondary | ICD-10-CM | POA: Diagnosis not present

## 2023-05-11 DIAGNOSIS — J4520 Mild intermittent asthma, uncomplicated: Secondary | ICD-10-CM | POA: Diagnosis not present

## 2023-05-11 DIAGNOSIS — Z0000 Encounter for general adult medical examination without abnormal findings: Secondary | ICD-10-CM | POA: Diagnosis not present

## 2023-05-11 DIAGNOSIS — F419 Anxiety disorder, unspecified: Secondary | ICD-10-CM | POA: Diagnosis not present

## 2023-05-11 LAB — LAB REPORT - SCANNED: EGFR: 78

## 2023-05-16 DIAGNOSIS — Z1231 Encounter for screening mammogram for malignant neoplasm of breast: Secondary | ICD-10-CM | POA: Diagnosis not present

## 2023-05-16 DIAGNOSIS — Z01419 Encounter for gynecological examination (general) (routine) without abnormal findings: Secondary | ICD-10-CM | POA: Diagnosis not present

## 2023-05-18 ENCOUNTER — Telehealth: Payer: Self-pay | Admitting: Cardiology

## 2023-05-18 NOTE — Telephone Encounter (Signed)
Pt c/o medication issue:  1. Name of Medication:   propranolol ER (INDERAL LA) 60 MG 24 hr capsule   2. How are you currently taking this medication (dosage and times per day)?   As prescribed  3. Are you having a reaction (difficulty breathing--STAT)?   No  4. What is your medication issue?   Patient stated she recently had triglyceride readings at 370 and it was recommended she follow-up with her cardiologist.

## 2023-05-18 NOTE — Telephone Encounter (Signed)
Called and spoke to patient who states that her PCP Dr. Cliffton Asters did her cholesterol labs and her triglycerides were elevated from last year. Patient states Dr. Cliffton Asters is starting her on vascepa and she is waiting for insurance approval to start this medication. Patient states she was fasting for these labs and that due to perimenopause she has been struggling with weight gain. She states she was recently able to lose a few pounds so she was surprised that her triglycerides were worse as she has been "watching all food intake very carefully". Advised patient we did not have these labs on file, requested patient have them sent to [place]. Patient agrees.

## 2023-05-19 DIAGNOSIS — G43719 Chronic migraine without aura, intractable, without status migrainosus: Secondary | ICD-10-CM | POA: Diagnosis not present

## 2023-05-19 DIAGNOSIS — M542 Cervicalgia: Secondary | ICD-10-CM | POA: Diagnosis not present

## 2023-05-25 ENCOUNTER — Encounter (HOSPITAL_COMMUNITY): Payer: Self-pay | Admitting: Emergency Medicine

## 2023-05-25 ENCOUNTER — Emergency Department (HOSPITAL_COMMUNITY): Payer: BC Managed Care – PPO

## 2023-05-25 ENCOUNTER — Other Ambulatory Visit: Payer: Self-pay

## 2023-05-25 ENCOUNTER — Emergency Department (HOSPITAL_COMMUNITY)
Admission: EM | Admit: 2023-05-25 | Discharge: 2023-05-25 | Disposition: A | Discharge status: Home / Self Care | Payer: BC Managed Care – PPO | Attending: Emergency Medicine | Admitting: Emergency Medicine

## 2023-05-25 DIAGNOSIS — K591 Functional diarrhea: Secondary | ICD-10-CM | POA: Diagnosis not present

## 2023-05-25 DIAGNOSIS — Z90710 Acquired absence of both cervix and uterus: Secondary | ICD-10-CM | POA: Diagnosis not present

## 2023-05-25 DIAGNOSIS — N8301 Follicular cyst of right ovary: Secondary | ICD-10-CM | POA: Diagnosis not present

## 2023-05-25 DIAGNOSIS — R1031 Right lower quadrant pain: Secondary | ICD-10-CM

## 2023-05-25 DIAGNOSIS — N83201 Unspecified ovarian cyst, right side: Secondary | ICD-10-CM | POA: Insufficient documentation

## 2023-05-25 DIAGNOSIS — K5730 Diverticulosis of large intestine without perforation or abscess without bleeding: Secondary | ICD-10-CM | POA: Diagnosis not present

## 2023-05-25 LAB — URINALYSIS, ROUTINE W REFLEX MICROSCOPIC
Bilirubin Urine: NEGATIVE
Glucose, UA: NEGATIVE mg/dL
Hgb urine dipstick: NEGATIVE
Ketones, ur: NEGATIVE mg/dL
Leukocytes,Ua: NEGATIVE
Nitrite: NEGATIVE
Protein, ur: NEGATIVE mg/dL
Specific Gravity, Urine: 1.018 (ref 1.005–1.030)
pH: 7 (ref 5.0–8.0)

## 2023-05-25 LAB — LIPASE, BLOOD: Lipase: 31 U/L (ref 11–51)

## 2023-05-25 LAB — COMPREHENSIVE METABOLIC PANEL
ALT: 23 U/L (ref 0–44)
AST: 20 U/L (ref 15–41)
Albumin: 3.7 g/dL (ref 3.5–5.0)
Alkaline Phosphatase: 66 U/L (ref 38–126)
Anion gap: 6 (ref 5–15)
BUN: 14 mg/dL (ref 6–20)
CO2: 21 mmol/L — ABNORMAL LOW (ref 22–32)
Calcium: 8.9 mg/dL (ref 8.9–10.3)
Chloride: 109 mmol/L (ref 98–111)
Creatinine, Ser: 0.9 mg/dL (ref 0.44–1.00)
GFR, Estimated: >60 mL/min (ref >60)
Glucose, Bld: 107 mg/dL — ABNORMAL HIGH (ref 70–99)
Potassium: 4.7 mmol/L (ref 3.5–5.1)
Sodium: 136 mmol/L (ref 135–145)
Total Bilirubin: 0.4 mg/dL (ref 0.3–1.2)
Total Protein: 6.1 g/dL — ABNORMAL LOW (ref 6.5–8.1)

## 2023-05-25 LAB — CBC
HCT: 45.1 % (ref 36.0–46.0)
Hemoglobin: 14.6 g/dL (ref 12.0–15.0)
MCH: 28.7 pg (ref 26.0–34.0)
MCHC: 32.4 g/dL (ref 30.0–36.0)
MCV: 88.8 fL (ref 80.0–100.0)
Platelets: 265 10*3/uL (ref 150–400)
RBC: 5.08 MIL/uL (ref 3.87–5.11)
RDW: 12.6 % (ref 11.5–15.5)
WBC: 8.5 10*3/uL (ref 4.0–10.5)
nRBC: 0 % (ref 0.0–0.2)

## 2023-05-25 MED ORDER — KETOROLAC TROMETHAMINE 15 MG/ML IJ SOLN
15.0000 mg | Freq: Once | INTRAMUSCULAR | Status: AC
  Administered 2023-05-25: 15 mg via INTRAVENOUS
  Filled 2023-05-25: qty 1

## 2023-05-25 MED ORDER — SODIUM CHLORIDE 0.9 % IV BOLUS
1000.0000 mL | Freq: Once | INTRAVENOUS | Status: AC
  Administered 2023-05-25: 1000 mL via INTRAVENOUS

## 2023-05-25 MED ORDER — IOHEXOL 350 MG/ML SOLN
75.0000 mL | Freq: Once | INTRAVENOUS | Status: AC | PRN
  Administered 2023-05-25: 75 mL via INTRAVENOUS

## 2023-05-25 NOTE — ED Provider Notes (Signed)
Madeline Everett EMERGENCY DEPARTMENT AT [facility] Provider Note   CSN: [id number] Arrival date & time: 05/25/23  1044     History  Chief Complaint  Patient presents with   Abdominal Pain    Madeline Everett is a 47 y.o. female, history of hysterectomy, cholecystectomy, who presents to the ED 2/2 to RLQ pain x1 day.  Reports she was woken up today via stabbing sharp right lower quadrant pain, that has been persistent since then.  Went to a walk-in clinic, was told to go to the ER to rule out appendicitis.  She denies any kind of nausea, vomiting, but states that for the last few days she has been having some diarrhea with red meat.  She has had ovarian cyst before, she states this feels very different.  She did have some alleviation with heat pad, but she states that soon as she moved again she had the sudden pain.  Denies any urinary symptoms, states walking clinic tested her for a urinary tract infection and it was negative.    Home Medications Prior to Admission medications   Medication Sig Start Date End Date Taking? Authorizing Provider  albuterol (PROVENTIL HFA;VENTOLIN HFA) 108 (90 BASE) MCG/ACT inhaler Inhale 1-2 puffs into the lungs every 6 (six) hours as needed for wheezing or shortness of breath.    [provider]  b complex vitamins capsule Take 1 capsule by mouth daily.    [provider]  buPROPion (WELLBUTRIN SR) 150 MG 12 hr tablet Take 1 tablet (150 mg total) by mouth 2 (two) times daily. 03/14/23   Corie Chiquito, PMHNP  cholecalciferol (VITAMIN D3) 25 MCG (1000 UNIT) tablet Take 1,000 Units by mouth daily.    [provider]  desvenlafaxine (PRISTIQ) 50 MG 24 hr tablet Take 1 tablet (50 mg total) by mouth daily. 03/14/23   Corie Chiquito, PMHNP  docusate sodium (COLACE) 100 MG capsule Take 100 mg by mouth daily as needed for mild constipation.    [provider]  famotidine (PEPCID) 20 MG tablet Take 20 mg by mouth as needed for  heartburn or indigestion.    [provider]  FLOVENT HFA 110 MCG/ACT inhaler Inhale 2 puffs into the lungs 2 (two) times daily. 07/07/22   [provider]  fluticasone (FLONASE) 50 MCG/ACT nasal spray Place into both nostrils as needed.    [provider]  Magnesium 250 MG TABS Take 250 mg by mouth every evening.     [provider]  ondansetron (ZOFRAN-ODT) 4 MG disintegrating tablet Take 4 mg by mouth daily as needed. 07/22/22   [provider]  Probiotic Product (ALIGN) 4 MG CAPS Take 4 mg by mouth daily.    [provider]  propranolol ER (INDERAL LA) 60 MG 24 hr capsule TAKE 1 CAPSULE BY MOUTH EVERY DAY 12/23/22   Sharlene Dory, NP  rizatriptan (MAXALT) 10 MG tablet Take 10 mg by mouth as needed for migraine. May repeat in 2 hours if needed    [provider]  Topiramate ER 150 MG CS24 Take 150 mg by mouth at bedtime.     [provider]      Allergies    Ciprofloxacin, Penicillins, Amoxicillin, Codeine, Fluoxetine, Guaifenesin & derivatives, Hydrocodone-acetaminophen, Iodine, Lactose intolerance (gi), Lactulose, Paxil [paroxetine hcl], and Percocet [oxycodone-acetaminophen]    Review of Systems   Review of Systems  Gastrointestinal:  Positive for abdominal pain. Negative for nausea and vomiting.    Physical Exam Updated Vital  Signs BP 126/84 (BP Location: Right Arm)   Pulse 82   Temp 98.4 F (36.9 C) (Oral)   Resp 16   Ht 5\' 4"  (1.626 m)   Wt 70.8 kg   LMP 02/13/2012 (Exact Date)   SpO2 98%   BMI 26.78 kg/m  Physical Exam Vitals and nursing note reviewed.  Constitutional:      General: She is not in acute distress.    Appearance: She is well-developed.  HENT:     Head: Normocephalic and atraumatic.  Eyes:     Conjunctiva/sclera: Conjunctivae normal.  Cardiovascular:     Rate and Rhythm: Normal rate and regular rhythm.     Heart sounds: No murmur heard. Pulmonary:     Effort: Pulmonary effort is  normal. No respiratory distress.     Breath sounds: Normal breath sounds.  Abdominal:     Palpations: Abdomen is soft.     Tenderness: There is abdominal tenderness in the right lower quadrant. There is guarding.  Musculoskeletal:        General: No swelling.     Cervical back: Neck supple.  Skin:    General: Skin is warm and dry.     Capillary Refill: Capillary refill takes less than 2 seconds.  Neurological:     Mental Status: She is alert.  Psychiatric:        Mood and Affect: Mood normal.     ED Results / Procedures / Treatments   Labs (all labs ordered are listed, but only abnormal results are displayed) Labs Reviewed  COMPREHENSIVE METABOLIC PANEL - Abnormal; Notable for the following components:      Result Value   CO2 21 (*)    Glucose, Bld 107 (*)    Total Protein 6.1 (*)    All other components within normal limits  URINALYSIS, ROUTINE W REFLEX MICROSCOPIC - Abnormal; Notable for the following components:   APPearance CLOUDY (*)    Bacteria, UA RARE (*)    All other components within normal limits  LIPASE, BLOOD  CBC    EKG None  Radiology US PELVIC COMPLETE W TRANSVAGINAL AND TORSION R/O  Result Date: 05/25/2023 CLINICAL DATA:  Acute right lower quadrant abdominal pain. EXAM: TRANSABDOMINAL AND TRANSVAGINAL ULTRASOUND OF PELVIS DOPPLER ULTRASOUND OF OVARIES TECHNIQUE: Both transabdominal and transvaginal ultrasound examinations of the pelvis were performed. Transabdominal technique was performed for global imaging of the pelvis including uterus, ovaries, adnexal regions, and pelvic cul-de-sac. It was necessary to proceed with endovaginal exam following the transabdominal exam to visualize the endometrium and ovaries. Color and duplex Doppler ultrasound was utilized to evaluate blood flow to the ovaries. COMPARISON:  CT of same day.  Ultrasound of May 24, 2019. FINDINGS: Status post hysterectomy. Right ovary Measurements: 2.5 x 1.9 x 1.8 cm = volume: 5 mL. 8 mm  follicular cyst is noted Left ovary Not visualized. Pulsed Doppler evaluation of right ovary demonstrates normal low-resistance arterial and venous waveforms. Other findings No abnormal free fluid. IMPRESSION: Status post hysterectomy. Left ovary is not visualized. No evidence of right ovarian mass or torsion. Electronically Signed   By: Lupita Raider M.D.   On: 05/25/2023 14:54   CT ABDOMEN PELVIS W CONTRAST  Result Date: 05/25/2023 CLINICAL DATA:  Right lower quadrant abdominal pain since this morning. EXAM: CT ABDOMEN AND PELVIS WITH CONTRAST TECHNIQUE: Multidetector CT imaging of the abdomen and pelvis was performed using the standard protocol following bolus administration of intravenous contrast. RADIATION DOSE REDUCTION: This exam was performed  according to the departmental dose-optimization program which includes automated exposure control, adjustment of the mA and/or kV according to patient size and/or use of iterative reconstruction technique. CONTRAST:  75mL OMNIPAQUE IOHEXOL 350 MG/ML SOLN COMPARISON:  CT 05/24/2019 FINDINGS: Lower chest: There is some linear opacity lung bases likely scar or atelectasis. No pleural effusion. Hepatobiliary: No focal liver abnormality is seen. Status post cholecystectomy. No biliary dilatation. Pancreas: Unremarkable. No pancreatic ductal dilatation or surrounding inflammatory changes. Spleen: Normal in size without focal abnormality. Adrenals/Urinary Tract: Adrenal glands are preserved. No enhancing renal mass or collecting system dilatation. There is Alaisa Moffitt low-attenuation lesion along the posterior aspect of the left mid kidney such as series 3, image 35 which is too Kodah Maret to completely characterize but likely a benign cysts. Bosniak 2 lesion. No specific imaging follow-up. The ureters have normal course and caliber down to the bladder. Preserved contours of the urinary bladder. Stomach/Bowel: Moderate fluid in the nondilated stomach. On this non oral contrast  exam, the Sarafina Puthoff bowel is nondilated. Large bowel is nondilated scattered stool. Normal appendix seen in the posterior right hemipelvis. Few sigmoid colon diverticula. Vascular/Lymphatic: No significant vascular findings are present. No enlarged abdominal or pelvic lymph nodes. Reproductive: Loeta Herst benign-appearing likely functional cystic structure in the right ovary measuring 17 mm. No specific imaging follow-up. Uterus is absent. Other: Trace free fluid in the pelvis which could be physiologic. No free intra-abdominal air. Musculoskeletal: Moderate degenerative changes at L5-S1 with disc height loss, endplate osteophytes and vacuum disc changes. Mild degenerative changes elsewhere. IMPRESSION: No bowel obstruction. Scattered colonic stool with normal appendix. Few sigmoid colon diverticula. Trace free fluid in the pelvis which could be physiologic. Prior cholecystectomy Electronically Signed   By: Karen Kays M.D.   On: 05/25/2023 13:04    Procedures Procedures    Medications Ordered in ED Medications  ketorolac (TORADOL) 15 MG/ML injection 15 mg (15 mg Intravenous Given 05/25/23 1144)  sodium chloride 0.9 % bolus 1,000 mL (1,000 mLs Intravenous New Bag/Given 05/25/23 1145)  iohexol (OMNIPAQUE) 350 MG/ML injection 75 mL (75 mLs Intravenous Contrast Given 05/25/23 1254)    ED Course/ Medical Decision Making/ A&P                             Medical Decision Making Patient is a 47 year old female, here for right lower quadrant abdominal pain for the last day.  Woke her up today with sharp stabbing pain.  Denies any urinary symptoms, or vaginal symptoms.  Was sent by urgent care, for evaluation for appendicitis.  Will obtain a CT abdomen pelvis, given right lower quadrant abdominal pain, with guarding.  Toradol for pain.  Amount and/or Complexity of Data Reviewed Labs: ordered.    Details: Unremarkable Radiology: ordered.    Details: CT on pelvis, shows no acute findings, chronic finding of right  ovarian cyst.  Ultrasound obtained to rule out ovarian torsion, negative for torsion. Discussion of management or test interpretation with external provider(s): Discussed with patient, ultrasound shows no evidence of torsion, she does have right ovarian cyst.  Given this I believe this is likely the cause of her pain.  She has no vaginal symptoms, or concern for STDs, thus I think something like PID would be unlikely.  Given that there is relief with heat pad, I also think that this is likely secondary to the cyst.  We will discharge her home, and use ibuprofen, and Tylenol for pain control.  Risk Prescription  drug management.    Final Clinical Impression(s) / ED Diagnoses Final diagnoses:  Right ovarian cyst  RLQ abdominal pain    Rx / DC Orders ED Discharge Orders     None         Braylei Totino L, PA 05/25/23 1520    Elayne Snare K, DO 05/25/23 1525

## 2023-05-25 NOTE — Telephone Encounter (Signed)
Called Dr. Lucilla Lame office at [facility] and requested recent FLP and ALT as office was closed from 05/20/23-05/24/23. Gave fax #.

## 2023-05-25 NOTE — Discharge Instructions (Addendum)
Please follow-up with your primary care doctor, return to the ED secondary to worsening abdominal pain, intractable nausea, vomiting, or urinary symptoms.

## 2023-05-25 NOTE — ED Triage Notes (Signed)
Patient arrives ambulatory by POV c/o RLQ abdominal pain onset of this morning. States went to walk in clinic and sent here for appendicitis rule out. Urinalysis done at clinic. Patient had had diarrhea with any red meat over the past 6 weeks. Pain radiating to lower back.

## 2023-06-03 ENCOUNTER — Telehealth: Payer: Self-pay

## 2023-06-03 NOTE — Telephone Encounter (Signed)
Called to discuss Dr. Norris Cross review of her labs. No answer, left detailed message per DPR asking patient to call our office to schedule follow up testing.

## 2023-06-03 NOTE — Telephone Encounter (Signed)
-----   Message from Quintella Reichert, MD sent at 06/01/2023  6:54 PM EDT ----- Please get a coronary calcium study and have her come in for a Lp(a) ----- Message ----- From: Luellen Pucker, RN Sent: 05/31/2023  12:27 PM EDT To: Quintella Reichert, MD  Patient's cholesterol labs that she had done on 05/11/23. Per encounter on 05/18/23 Dr. Cliffton Asters is trying to start patient on vascepa.

## 2023-06-06 ENCOUNTER — Telehealth: Payer: Self-pay | Admitting: Cardiology

## 2023-06-06 NOTE — Telephone Encounter (Signed)
Pt returning nurses phone call. Pt states she also has a few questions regarding the message left for her. Please advise.

## 2023-06-07 ENCOUNTER — Telehealth: Payer: Self-pay

## 2023-06-07 DIAGNOSIS — Z79899 Other long term (current) drug therapy: Secondary | ICD-10-CM

## 2023-06-07 DIAGNOSIS — E785 Hyperlipidemia, unspecified: Secondary | ICD-10-CM

## 2023-06-07 NOTE — Telephone Encounter (Signed)
Reviewed cholesterol labs and Dr. Norris Cross recommendations with patient. Patient verbalizes understanding of elevated LDL, states she has started vascepa but has had difficult tolerating it due to stomach upset. Lipoprotein A scheduled, order placed for coronary calcium score. Advised patient of copay for coronary calcium score CT.

## 2023-06-07 NOTE — Telephone Encounter (Signed)
-----   Message from Quintella Reichert, MD sent at 06/01/2023  6:54 PM EDT ----- Please get a coronary calcium study and have her come in for a Lp(a) ----- Message ----- From: Luellen Pucker, RN Sent: 05/31/2023  12:27 PM EDT To: Quintella Reichert, MD  Patient's cholesterol labs that she had done on 05/11/23. Per encounter on 05/18/23 Dr. Cliffton Asters is trying to start patient on vascepa.

## 2023-06-10 ENCOUNTER — Ambulatory Visit: Payer: BC Managed Care – PPO | Attending: Internal Medicine

## 2023-06-10 DIAGNOSIS — Z79899 Other long term (current) drug therapy: Secondary | ICD-10-CM

## 2023-06-13 LAB — LIPOPROTEIN A (LPA): Lipoprotein (a): 22.5 nmol/L (ref <75.0)

## 2023-06-29 ENCOUNTER — Ambulatory Visit (HOSPITAL_COMMUNITY)
Admission: RE | Admit: 2023-06-29 | Discharge: 2023-06-29 | Disposition: A | Discharge status: Home / Self Care | Payer: BC Managed Care – PPO | Source: Ambulatory Visit | Attending: Cardiology | Admitting: Cardiology

## 2023-06-29 DIAGNOSIS — E785 Hyperlipidemia, unspecified: Secondary | ICD-10-CM | POA: Insufficient documentation

## 2023-07-01 ENCOUNTER — Encounter: Payer: Self-pay | Admitting: Cardiology

## 2023-07-06 ENCOUNTER — Telehealth: Payer: Self-pay

## 2023-07-06 DIAGNOSIS — E785 Hyperlipidemia, unspecified: Secondary | ICD-10-CM | POA: Diagnosis not present

## 2023-07-06 DIAGNOSIS — Z79899 Other long term (current) drug therapy: Secondary | ICD-10-CM | POA: Diagnosis not present

## 2023-07-06 MED ORDER — ASPIRIN 81 MG PO TBEC: 81.0000 mg | DELAYED_RELEASE_TABLET | Freq: Every day | ORAL | 3 refills | Status: AC

## 2023-07-06 NOTE — Telephone Encounter (Signed)
Reviewed results with patient. Patient verbalizes understanding of elevated calcium score given age range and other controls. Patient agrees to start aspirin 81 mg, med list updated. Patient states she had FLP/Alt at PCP office today and will send those results to [place]. Also reviewed atelectasis seen on noncardiac portion of CT, explained those results would be forwarded to PCP for review. Patient verbalizes understanding.

## 2023-07-06 NOTE — Telephone Encounter (Signed)
-----   Message from Armanda Magic sent at 07/01/2023  8:27 AM EDT ----- Mild coronary calcium at 30.3 in the LAD.  Although the number is low it is high for her age at 31 percentile for age race and sex matched controls.  Please have her come in for an more fasting lipid panel and ALT.  Start aspirin 81 mg daily

## 2023-07-18 ENCOUNTER — Telehealth: Payer: Self-pay

## 2023-07-18 NOTE — Telephone Encounter (Signed)
See lab tab for patient education/responses.

## 2023-07-18 NOTE — Telephone Encounter (Signed)
-----   Message from Armanda Magic sent at 07/11/2023  3:56 PM EDT ----- Due to coronary Ca her LDL goal is < 70.  Add Crestor 20mg  daily and repeat FLP and ALT in 6 weeks

## 2023-07-18 NOTE — Telephone Encounter (Signed)
Called patient to discuss cholesterol labs and Dr. Norris Cross recommendation to add 20 mg crestor daily, repeat labs in 6 weeks. No answer, left message per DPR asking patient to call our office.

## 2023-07-18 NOTE — Telephone Encounter (Signed)
-----   Message from Armanda Magic sent at 07/16/2023  8:33 AM EDT ----- 20mg  ----- Message ----- From: Luellen Pucker, RN Sent: 07/15/2023   4:26 PM EDT To: Quintella Reichert, MD  Did you want 10 mg or 20  mg of crestor? She has another lab result where you ordered 20.  Alcario Drought ----- Message ----- From: Quintella Reichert, MD Sent: 07/15/2023  12:31 PM EDT To: Luellen Pucker, RN  LDL too high at 121 and goal is < 70  due to coronary calcium- please start Crestor 10mg  daily and repeat FLP and ALT in 6 weeks ----- Message ----- From: Luellen Pucker, RN Sent: 07/15/2023   9:37 AM EDT To: Quintella Reichert, MD  Patient turned in requested labs.

## 2023-07-19 ENCOUNTER — Telehealth: Payer: Self-pay

## 2023-07-19 DIAGNOSIS — E785 Hyperlipidemia, unspecified: Secondary | ICD-10-CM

## 2023-07-19 DIAGNOSIS — Z79899 Other long term (current) drug therapy: Secondary | ICD-10-CM

## 2023-07-19 MED ORDER — ROSUVASTATIN CALCIUM 20 MG PO TABS: 20.0000 mg | ORAL_TABLET | Freq: Every day | ORAL | 3 refills | Status: DC

## 2023-07-19 MED ORDER — FISH OIL 435 MG PO CAPS: 1.0000 | ORAL_CAPSULE | Freq: Every day | ORAL | 3 refills | Status: DC

## 2023-07-19 NOTE — Telephone Encounter (Signed)
Reviewed elevated LDL on labs sent from PCP, patient verbalizes understanding. Agrees to start 20 mg crestor daily, FLP and ALT scheduled for 6 weeks out.  Patient states she is on Vascepa which was started by Dr. Cliffton Asters. She states she is only taking half the dose as it gave her a rash at the full dose and also made her feel sick. Patient is asking if there is a chance that she could go back on OTC fish oil if statin drug is effective. Responses forwarded to PharmD and Dr. Mayford Knife.

## 2023-07-19 NOTE — Telephone Encounter (Signed)
Reviewed with patient that per Dr. Mayford Knife ok to go back on fish oil 4 gm daily instead of vascepa. Med list updated.

## 2023-07-19 NOTE — Telephone Encounter (Signed)
-----   Message from Armanda Magic sent at 07/16/2023  8:28 AM EDT ----- 20mg  ----- Message ----- From: Luellen Pucker, RN Sent: 07/15/2023   4:23 PM EDT To: Quintella Reichert, MD  Did you want 10 mg or 20  mg of crestor? Alcario Drought ----- Message ----- From: Quintella Reichert, MD Sent: 07/11/2023   3:56 PM EDT To: Luellen Pucker, RN  Due to coronary Ca her LDL goal is < 70.  Add Crestor 20mg  daily and repeat FLP and ALT in 6 weeks

## 2023-07-19 NOTE — Addendum Note (Signed)
Addended by: Luellen Pucker on: 07/19/2023 03:08 PM   Modules accepted: Orders

## 2023-07-20 DIAGNOSIS — Z6827 Body mass index (BMI) 27.0-27.9, adult: Secondary | ICD-10-CM | POA: Diagnosis not present

## 2023-07-20 DIAGNOSIS — K581 Irritable bowel syndrome with constipation: Secondary | ICD-10-CM | POA: Diagnosis not present

## 2023-07-20 DIAGNOSIS — E663 Overweight: Secondary | ICD-10-CM | POA: Diagnosis not present

## 2023-07-25 DIAGNOSIS — Z1283 Encounter for screening for malignant neoplasm of skin: Secondary | ICD-10-CM | POA: Diagnosis not present

## 2023-07-25 DIAGNOSIS — D225 Melanocytic nevi of trunk: Secondary | ICD-10-CM | POA: Diagnosis not present

## 2023-07-25 DIAGNOSIS — L718 Other rosacea: Secondary | ICD-10-CM | POA: Diagnosis not present

## 2023-08-30 NOTE — H&P (Signed)
HPI:   Madeline Everett is a 47 y.o. female who presents as a new Patient.   Referring Provider: No ref. provider found  Chief complaint: Nasal obstruction.  HPI: She is having difficulty breathing through the left side of her nose. She has had this problem for years but it seems to be getting worse. Recommendation was made years ago for a septoplasty but she did not want to pursue the surgery at that time. She is having worsening snoring. Her sleep quality is poor. She has multiple other health problems. She gets facial pain and headaches.  PMH/Meds/All/SocHx/FamHx/ROS:   Past Medical History:  Diagnosis Date  Headache(784.0)   History reviewed. No pertinent surgical history.  No family history of bleeding disorders, wound healing problems or difficulty with anesthesia.     Current Outpatient Medications:  albuterol HFA (Ventolin HFA) 90 mcg/actuation inhaler, Inhale 1 puff., Disp: , Rfl:  bifidobacterium infantis (Align) 4 mg capsule, Take 4 mg by mouth daily., Disp: , Rfl:  buPROPion (WELLBUTRIN SR) 150 mg 12 hr tablet, Take 150 mg by mouth., Disp: , Rfl:  cholecalciferol (VITAMIN D3) 1,000 unit (25 mcg) tablet, Take 1,000 Units by mouth daily., Disp: , Rfl:  desvenlafaxine (PRISTIQ) 50 mg 24 hr tablet, Take 50 mg by mouth daily., Disp: , Rfl:  docusate sodium (COLACE) 100 mg capsule, Take 100 mg by mouth., Disp: , Rfl:  famotidine (PEPCID) 20 mg tablet, Take 20 mg by mouth., Disp: , Rfl:  fluticasone propionate (FLONASE) 50 mcg/spray nasal spray, 2 sprays., Disp: , Rfl:  ibuprofen (MOTRIN) 200 mg tablet, Take by mouth., Disp: , Rfl:  magnesium 250 mg tablet, Take 1 tablet by mouth., Disp: , Rfl:  mometasone-formoterol (Dulera) 200-5 mcg/actuation HFAA inhaler, Inhale 2 puffs., Disp: , Rfl:  propranoloL (INDERAL) 60 mg tablet, , Disp: , Rfl:  pseudoePHEDrine (SUDAFED) 30 mg tablet, Take by mouth., Disp: , Rfl:  raNITIdine (ZANTAC) 75 mg tablet, Take by mouth., Disp: , Rfl:   rizatriptan MLT (MAXALT-MLT) 10 mg disintegrating tablet, , Disp: , Rfl:  saccharomyces boulardii (Florastor) 250 mg capsule, Take by mouth., Disp: , Rfl:  topiramate (TOPAMAX) 100 mg tablet, 150 mg., Disp: , Rfl:  vitamin B complex (B COMPLEX 1 ORAL), , Disp: , Rfl:   A complete ROS was performed with pertinent positives/negatives noted in the HPI. The remainder of the ROS are negative.   Physical Exam:   Temp 96.6 F (35.9 C) (Temporal)  Resp 18  Ht 1.626 m (5\' 4" )  Wt 72 kg (158 lb 12.8 oz)  BMI 27.26 kg/m   General: Healthy and alert, in no distress, breathing easily. Normal affect. In a pleasant mood. Head: Normocephalic, atraumatic. No masses, or scars. Eyes: Pupils are equal, and reactive to light. Vision is grossly intact. No spontaneous or gaze nystagmus. Ears: Ear canals are clear. Tympanic membranes are intact, with normal landmarks and the middle ears are clear and healthy. Hearing: Grossly normal. Nose: Nasal cavities are clear with healthy mucosa, there is what appears to be a polypoid mass in the posterior left nasal cavity. The septum does not appear to be deviated. Face: No masses or scars, facial nerve function is symmetric. Oral Cavity: No mucosal abnormalities are noted. Tongue with normal mobility. Dentition appears healthy. Oropharynx: Tonsils are symmetric. There are no mucosal masses identified. Tongue base appears normal and healthy. Larynx/Hypopharynx: deferred Chest: Deferred Neck: No palpable masses, no cervical adenopathy, no thyroid nodules or enlargement. Neuro: Cranial nerves II-XII with normal function. Balance: Normal gate.  Other findings: none.  Independent Review of Additional Tests or Records:  none  Procedures:  none  Impression & Plans:  Possible nasal polyps. Recommend CT imaging of the sinuses to further evaluate this. Will discuss treatment options following that. Depending on what we find we may want to get a sleep study as well at  some point. This will also help determine if the headaches and facial pain are sinus related.

## 2023-09-02 ENCOUNTER — Ambulatory Visit: Payer: BC Managed Care – PPO | Attending: Cardiology

## 2023-09-02 DIAGNOSIS — E785 Hyperlipidemia, unspecified: Secondary | ICD-10-CM | POA: Diagnosis not present

## 2023-09-02 DIAGNOSIS — Z79899 Other long term (current) drug therapy: Secondary | ICD-10-CM

## 2023-09-03 LAB — LIPID PANEL
Chol/HDL Ratio: 2.8 {ratio} (ref 0.0–4.4)
Cholesterol, Total: 130 mg/dL (ref 100–199)
HDL: 47 mg/dL (ref >39)
LDL Chol Calc (NIH): 54 mg/dL (ref 0–99)
Triglycerides: 177 mg/dL — ABNORMAL HIGH (ref 0–149)
VLDL Cholesterol Cal: 29 mg/dL (ref 5–40)

## 2023-09-03 LAB — ALT: ALT: 46 IU/L — ABNORMAL HIGH (ref 0–32)

## 2023-09-05 ENCOUNTER — Encounter (HOSPITAL_BASED_OUTPATIENT_CLINIC_OR_DEPARTMENT_OTHER): Payer: Self-pay | Admitting: Otolaryngology

## 2023-09-05 NOTE — Progress Notes (Addendum)
Dr. Hyacinth Meeker reviewed pt's chart, medical history, and cardiology office note from 09/29/2022.  Pt denies chest pain and SOB at this current time.  Dr. Hyacinth Meeker agreed with RN for the pt to come in for an updated EKG before surgery.  Ok with pt to proceed with surgery here at [facility] on 09/12/2023, pending EKG results.  RN left message with Wynona Canes, Dr. Lucky Rathke assistant, to confirm when the pt needs to stop taking aspirin.

## 2023-09-07 ENCOUNTER — Telehealth: Payer: Self-pay

## 2023-09-07 DIAGNOSIS — E785 Hyperlipidemia, unspecified: Secondary | ICD-10-CM

## 2023-09-07 DIAGNOSIS — Z79899 Other long term (current) drug therapy: Secondary | ICD-10-CM

## 2023-09-07 NOTE — Addendum Note (Signed)
Addended by: Luellen Pucker on: 09/07/2023 05:25 PM   Modules accepted: Orders

## 2023-09-07 NOTE — Telephone Encounter (Signed)
Reviewed lab results with patient who states she forgot she was fasting and had coffee with creamer in it prior to her lab work being performed. Patient responses forwarded to Dr. Mayford Knife.

## 2023-09-07 NOTE — Telephone Encounter (Signed)
Call to patient to schedule follow up labs.

## 2023-09-07 NOTE — Telephone Encounter (Signed)
-----   Message from Nurse Corky Crafts sent at 09/06/2023  8:10 AM EDT -----  ----- Message ----- From: Quintella Reichert, MD Sent: 09/04/2023   5:34 PM EDT To: [facility] Triage  Please find out if she was fasting for this lab

## 2023-09-08 ENCOUNTER — Encounter (HOSPITAL_BASED_OUTPATIENT_CLINIC_OR_DEPARTMENT_OTHER)
Admission: RE | Admit: 2023-09-08 | Discharge: 2023-09-08 | Disposition: A | Discharge status: Home / Self Care | Payer: BC Managed Care – PPO | Source: Ambulatory Visit | Attending: Otolaryngology | Admitting: Otolaryngology

## 2023-09-08 DIAGNOSIS — Z01818 Encounter for other preprocedural examination: Secondary | ICD-10-CM | POA: Diagnosis not present

## 2023-09-08 DIAGNOSIS — Q2381 Bicuspid aortic valve: Secondary | ICD-10-CM | POA: Insufficient documentation

## 2023-09-08 DIAGNOSIS — Z01810 Encounter for preprocedural cardiovascular examination: Secondary | ICD-10-CM | POA: Diagnosis not present

## 2023-09-09 ENCOUNTER — Ambulatory Visit (INDEPENDENT_AMBULATORY_CARE_PROVIDER_SITE_OTHER): Payer: BC Managed Care – PPO | Admitting: Psychiatry

## 2023-09-09 ENCOUNTER — Encounter: Payer: Self-pay | Admitting: Psychiatry

## 2023-09-09 DIAGNOSIS — F411 Generalized anxiety disorder: Secondary | ICD-10-CM

## 2023-09-09 MED ORDER — BUPROPION HCL ER (SR) 150 MG PO TB12: 150.0000 mg | ORAL_TABLET | Freq: Two times a day (BID) | ORAL | 2 refills | Status: AC

## 2023-09-09 MED ORDER — DESVENLAFAXINE SUCCINATE ER 50 MG PO TB24: 50.0000 mg | ORAL_TABLET | Freq: Every day | ORAL | 2 refills | Status: DC

## 2023-09-09 NOTE — Progress Notes (Signed)
Madeline Everett [id number] 04-20-1976 47 y.o.  Subjective:   Patient ID:  Madeline Everett is a 47 y.o. (DOB January 29, 1976) female.  Chief Complaint:  Chief Complaint  Patient presents with   Follow-up    Anxiety and depression    HPI Madeline Everett presents to the office today for follow-up of anxiety and depression. Mother had a heart attack a few weeks ago and had to have a double bypass surgery. Mother is doing well now. Her sister has been having some anxiety issues presenting with physical symptoms. Sister has been reaching out to her for support. She reports that her anxiety has been manageable. Sleep is sometimes disrupted due to sinus issues and has surgery scheduled next week. She reports that depression has been "ok." She reports occasionally she has some "rough days" at work and mood is a little lower. Energy and motivation have been ok. She reports that her energy has been better recently- "and I don't know why." She reports that she has been feeling better in general over the last month. She reports that her appetite has been less due to stomach upset. Concentration has been adequate. Denies SI.   Past Psychiatric Medication Trials: Remeron- Nasal congestion Cymbalta- Effective but caused constipation. PVC, SVT  Wellbutrin- reports that this was started for chronic fatigue. Stopped by neurologist due to interaction with another medication. She had Difficulty with Wellbutrin XL(helped and then crashed mid-day) Zoloft- adverse effect. Irritability. Affective dulling. Sexual side effects. Prozac- Effective and then started having HA's with it.  Paxil Lexapro Effexor Pristiq Trintellix Buspar Trazodone Topamax Propranolol  Flowsheet Row ED from 05/25/2023 in [facility] Emergency Department at [facility]  C-SSRS RISK CATEGORY No Risk        Review of Systems:  Review of Systems  HENT:  Positive for sinus pressure and sinus pain.        Has sinus surgery  scheduled next week  Gastrointestinal:  Positive for diarrhea.  Musculoskeletal:  Negative for gait problem.  Neurological:  Negative for tremors.  Psychiatric/Behavioral:         Please refer to HPI    Medications: I have reviewed the patient's current medications.  Current Outpatient Medications  Medication Sig Dispense Refill   albuterol (PROVENTIL HFA;VENTOLIN HFA) 108 (90 BASE) MCG/ACT inhaler Inhale 1-2 puffs into the lungs every 6 (six) hours as needed for wheezing or shortness of breath.     aspirin EC 81 MG tablet Take 1 tablet (81 mg total) by mouth daily. Swallow whole. 90 tablet 3   b complex vitamins capsule Take 1 capsule by mouth daily.     cholecalciferol (VITAMIN D3) 25 MCG (1000 UNIT) tablet Take 1,000 Units by mouth daily.     docusate sodium (COLACE) 100 MG capsule Take 100 mg by mouth daily as needed for mild constipation.     famotidine (PEPCID) 20 MG tablet Take 20 mg by mouth as needed for heartburn or indigestion.     FLOVENT HFA 110 MCG/ACT inhaler Inhale 2 puffs into the lungs 2 (two) times daily.     fluticasone (FLONASE) 50 MCG/ACT nasal spray Place into both nostrils as needed.     Magnesium 250 MG TABS Take 250 mg by mouth every evening.      Omega-3 Fatty Acids (FISH OIL) 435 MG CAPS Take 1 capsule (435 mg total) by mouth daily. 30 capsule 3   Probiotic Product (ALIGN) 4 MG CAPS Take 4 mg by mouth daily.  propranolol ER (INDERAL LA) 60 MG 24 hr capsule TAKE 1 CAPSULE BY MOUTH EVERY DAY 90 capsule 3   rizatriptan (MAXALT) 10 MG tablet Take 10 mg by mouth as needed for migraine. May repeat in 2 hours if needed     rosuvastatin (CRESTOR) 20 MG tablet Take 1 tablet (20 mg total) by mouth daily. 90 tablet 3   Topiramate ER 150 MG CS24 Take 150 mg by mouth at bedtime.      VASCEPA 1 g capsule Take 2 g by mouth 2 (two) times daily.     buPROPion (WELLBUTRIN SR) 150 MG 12 hr tablet Take 1 tablet (150 mg total) by mouth 2 (two) times daily. 180 tablet 2    desvenlafaxine (PRISTIQ) 50 MG 24 hr tablet Take 1 tablet (50 mg total) by mouth daily. 90 tablet 2   ondansetron (ZOFRAN-ODT) 4 MG disintegrating tablet Take 4 mg by mouth daily as needed.     No current facility-administered medications for this visit.    Medication Side Effects: None  Allergies:  Allergies  Allergen Reactions   Ciprofloxacin Swelling    SWELLING REACTION UNSPECIFIED    Penicillins Rash and Other (See Comments)    Has patient had a PCN reaction causing immediate rash, facial/tongue/throat swelling, SOB or lightheadedness with hypotension: No Has patient had a PCN reaction causing severe rash involving mucus membranes or skin necrosis: Yes  Has patient had a PCN reaction that required hospitalization No Has patient had a PCN reaction occurring within the last 10 years: Yes     Amoxicillin Rash   Codeine Nausea And Vomiting   Fluoxetine Other (See Comments)    Headaches   Guaifenesin & Derivatives Nausea And Vomiting    GI   Hydrocodone-Acetaminophen Nausea Only   Iodine Rash   Lactose Intolerance (Gi) Other (See Comments)    GI   Lactulose Nausea And Vomiting    GI   Paxil [Paroxetine Hcl] Other (See Comments)    UNSPECIFIED REACTION  [Does not remember]   Percocet [Oxycodone-Acetaminophen] Nausea And Vomiting    Past Medical History:  Diagnosis Date   Acid reflux    Agatston coronary artery calcium score less than 100    coronary Ca score 30 06/2023   Anxiety    Aortic valve disorder    Bicuspid aortic valve - stable by echo 11/2019   Asthma    Atypical chest pain    Chronic fatigue syndrome    Complication of anesthesia    nausea & vomiting and difficult time waking up   Compound nevus 01/2012   dysplastic, melanocytic atypia   Depression    crying spells for a year due to stress of  infertility   Deviated septum    Dr Melvenia Beam   Endometriosis    Family history of adverse reaction to anesthesia    sister-n/v   FH: thyroid condition     Frequent PVCs    Gestational hypertension    H/O candidiasis    H/O constipation    H/O dyspareunia    H/O varicella    childhood   Headache(784.0)    migraine topomax   Heart murmur    congential bicuspid aortic valve, mitral valve prolapse   Herniated disc 2008   HTN (hypertension)    IBS (irritable bowel syndrome)    Infertility, female    Pelvic pain    Pneumonia    hx of   PONV (postoperative nausea and vomiting)    needs Scop  patch   Postpartum hypertension    not being treated now   SVT (supraventricular tachycardia) (HCC)    UTI (lower urinary tract infection)    Yeast infection     Past Medical History, Surgical history, Social history, and Family history were reviewed and updated as appropriate.   Please see review of systems for further details on the patient's review from today.   Objective:   Physical Exam:  LMP 02/13/2012 (Exact Date)   Physical Exam Constitutional:      General: She is not in acute distress. Musculoskeletal:        General: No deformity.  Neurological:     Mental Status: She is alert and oriented to person, place, and time.     Coordination: Coordination normal.  Psychiatric:        Attention and Perception: Attention and perception normal. She does not perceive auditory or visual hallucinations.        Mood and Affect: Mood normal. Mood is not anxious or depressed. Affect is not labile, blunt, angry or inappropriate.        Speech: Speech normal.        Behavior: Behavior normal.        Thought Content: Thought content normal. Thought content is not paranoid or delusional. Thought content does not include homicidal or suicidal ideation. Thought content does not include homicidal or suicidal plan.        Cognition and Memory: Cognition and memory normal.        Judgment: Judgment normal.     Comments: Insight intact     Lab Review:     Component Value Date/Time   NA 136 05/25/2023 1057   NA 141 07/14/2022 1507   K 4.7  05/25/2023 1057   CL 109 05/25/2023 1057   CO2 21 (L) 05/25/2023 1057   GLUCOSE 107 (H) 05/25/2023 1057   BUN 14 05/25/2023 1057   BUN 12 07/14/2022 1507   CREATININE 0.90 05/25/2023 1057   CREATININE 0.85 08/04/2016 0944   CALCIUM 8.9 05/25/2023 1057   PROT 6.1 (L) 05/25/2023 1057   ALBUMIN 3.7 05/25/2023 1057   AST 20 05/25/2023 1057   ALT 46 (H) 09/02/2023 1218   ALKPHOS 66 05/25/2023 1057   BILITOT 0.4 05/25/2023 1057   GFRNONAA >60 05/25/2023 1057   GFRAA >60 08/25/2020 1903       Component Value Date/Time   WBC 8.5 05/25/2023 1057   RBC 5.08 05/25/2023 1057   HGB 14.6 05/25/2023 1057   HGB 14.7 06/28/2022 0941   HCT 45.1 05/25/2023 1057   HCT 43.1 06/28/2022 0941   PLT 265 05/25/2023 1057   PLT 286 06/28/2022 0941   MCV 88.8 05/25/2023 1057   MCV 86 06/28/2022 0941   MCH 28.7 05/25/2023 1057   MCHC 32.4 05/25/2023 1057   RDW 12.6 05/25/2023 1057   RDW 12.7 06/28/2022 0941   LYMPHSABS 2.3 08/25/2020 1903   MONOABS 0.6 08/25/2020 1903   EOSABS 0.0 08/25/2020 1903   BASOSABS 0.0 08/25/2020 1903    No results found for: "POCLITH", "LITHIUM"   No results found for: "PHENYTOIN", "PHENOBARB", "VALPROATE", "CBMZ"   .res Assessment: Plan:   Continue Pristiq 50 mg daily for mood and anxiety.  Continue Wellbutrin SR 150 mg twice daily for depression.  Pt to follow-up in 9 months or sooner if clinically indicated.  Patient advised to contact office with any questions, adverse effects, or acute worsening in signs and symptoms.   Kaeden was seen today  for follow-up.  Diagnoses and all orders for this visit:  Generalized anxiety disorder -     desvenlafaxine (PRISTIQ) 50 MG 24 hr tablet; Take 1 tablet (50 mg total) by mouth daily.  Other orders -     buPROPion (WELLBUTRIN SR) 150 MG 12 hr tablet; Take 1 tablet (150 mg total) by mouth 2 (two) times daily.     Please see After Visit Summary for patient specific instructions.  Future Appointments  Date Time  Provider Department Center  09/19/2023  8:45 AM [facility]    No orders of the defined types were placed in this encounter.   -------------------------------

## 2023-09-09 NOTE — Anesthesia Preprocedure Evaluation (Addendum)
Anesthesia Evaluation  Patient identified by MRN, date of birth, ID band Patient awake    Reviewed: Allergy & Precautions, NPO status , Patient's Chart, lab work & pertinent test results  History of Anesthesia Complications (+) PONV, PROLONGED EMERGENCE and history of anesthetic complications (does well w/ scop patch)  Airway Mallampati: III  TM Distance: >3 FB Neck ROM: Full    Dental  (+) Teeth Intact, Dental Advisory Given   Pulmonary asthma (well controlled, uses inhaler less than once a month)  Snores at night, sleep study many years ago but doesn't remember the results- never prescribed a CPAP   Pulmonary exam normal breath sounds clear to auscultation       Cardiovascular hypertension, Normal cardiovascular exam+ dysrhythmias Supra Ventricular Tachycardia + Valvular Problems/Murmurs (bicuspid AV, mild MR on echo 2023)  Rhythm:Regular Rate:Normal  Echo 2023  1. Left ventricular ejection fraction by 3D volume is 52 %. The left  ventricle has low normal function. The left ventricle has no regional wall  motion abnormalities. Left ventricular diastolic parameters are consistent  with Grade I diastolic dysfunction   (impaired relaxation). The average left ventricular global longitudinal  strain is -19.2 %. The global longitudinal strain is normal.   2. Right ventricular systolic function is normal. The right ventricular  size is normal.   3. The mitral valve is grossly normal. Mild mitral valve regurgitation.  No evidence of mitral stenosis.   4. Aortic valve previously described as bicuspid. The aortic valve was  not well visualized. Aortic valve regurgitation is not visualized. No  aortic stenosis is present.   5. The inferior vena cava is normal in size with greater than 50%  respiratory variability, suggesting right atrial pressure of 3 mmHg.      Neuro/Psych  Headaches PSYCHIATRIC DISORDERS Anxiety Depression        GI/Hepatic Neg liver ROS,GERD  Controlled,,  Endo/Other  negative endocrine ROS    Renal/GU negative Renal ROS  negative genitourinary   Musculoskeletal negative musculoskeletal ROS (+)    Abdominal   Peds  Hematology negative hematology ROS (+)   Anesthesia Other Findings   Reproductive/Obstetrics negative OB ROS S/p hysterectomy                              Anesthesia Physical Anesthesia Plan  ASA: 2  Anesthesia Plan: General   Post-op Pain Management: Tylenol PO (pre-op)* and Precedex   Induction: Intravenous  PONV Risk Score and Plan: 4 or greater and Ondansetron, Dexamethasone, Propofol infusion, TIVA, Midazolam, Treatment may vary due to age or medical condition and Scopolamine patch - Pre-op  Airway Management Planned: Oral ETT  Additional Equipment: None  Intra-op Plan:   Post-operative Plan: Extubation in OR  Informed Consent: I have reviewed the patients History and Physical, chart, labs and discussed the procedure including the risks, benefits and alternatives for the proposed anesthesia with the patient or authorized representative who has indicated his/her understanding and acceptance.     Dental advisory given  Plan Discussed with: CRNA  Anesthesia Plan Comments: (Does well w/ tramadol  Last airway note: Laryngoscope Size: Miller and 2 Grade View: Grade I Tube type: Oral Number of attempts: 1 )        Anesthesia Quick Evaluation

## 2023-09-12 ENCOUNTER — Ambulatory Visit (HOSPITAL_BASED_OUTPATIENT_CLINIC_OR_DEPARTMENT_OTHER): Payer: BC Managed Care – PPO | Admitting: Anesthesiology

## 2023-09-12 ENCOUNTER — Encounter (HOSPITAL_BASED_OUTPATIENT_CLINIC_OR_DEPARTMENT_OTHER): Payer: Self-pay | Admitting: Otolaryngology

## 2023-09-12 ENCOUNTER — Other Ambulatory Visit: Payer: Self-pay

## 2023-09-12 ENCOUNTER — Ambulatory Visit (HOSPITAL_BASED_OUTPATIENT_CLINIC_OR_DEPARTMENT_OTHER)
Admission: RE | Admit: 2023-09-12 | Discharge: 2023-09-12 | Disposition: A | Discharge status: Home / Self Care | Payer: BC Managed Care – PPO | Attending: Otolaryngology | Admitting: Otolaryngology

## 2023-09-12 ENCOUNTER — Encounter (HOSPITAL_BASED_OUTPATIENT_CLINIC_OR_DEPARTMENT_OTHER)
Admission: RE | Disposition: A | Discharge status: Home / Self Care | Payer: Self-pay | Source: Home / Self Care | Attending: Otolaryngology

## 2023-09-12 DIAGNOSIS — J339 Nasal polyp, unspecified: Secondary | ICD-10-CM | POA: Insufficient documentation

## 2023-09-12 DIAGNOSIS — R0683 Snoring: Secondary | ICD-10-CM | POA: Insufficient documentation

## 2023-09-12 DIAGNOSIS — Q2381 Bicuspid aortic valve: Secondary | ICD-10-CM

## 2023-09-12 DIAGNOSIS — Z90710 Acquired absence of both cervix and uterus: Secondary | ICD-10-CM | POA: Insufficient documentation

## 2023-09-12 DIAGNOSIS — R0981 Nasal congestion: Secondary | ICD-10-CM | POA: Diagnosis not present

## 2023-09-12 DIAGNOSIS — J3489 Other specified disorders of nose and nasal sinuses: Secondary | ICD-10-CM | POA: Diagnosis not present

## 2023-09-12 HISTORY — PX: NASAL SEPTOPLASTY W/ TURBINOPLASTY: SHX2070

## 2023-09-12 SURGERY — SEPTOPLASTY, NOSE, WITH NASAL TURBINATE REDUCTION
Anesthesia: General | Site: Nose | Laterality: Bilateral

## 2023-09-12 MED ORDER — AMISULPRIDE (ANTIEMETIC) 5 MG/2ML IV SOLN
10.0000 mg | Freq: Once | INTRAVENOUS | Status: AC | PRN
  Administered 2023-09-12: 10 mg via INTRAVENOUS

## 2023-09-12 MED ORDER — DEXAMETHASONE SODIUM PHOSPHATE 4 MG/ML IJ SOLN
INTRAMUSCULAR | Status: DC | PRN
  Administered 2023-09-12: 10 mg via INTRAVENOUS

## 2023-09-12 MED ORDER — BACITRACIN ZINC 500 UNIT/GM EX OINT
TOPICAL_OINTMENT | CUTANEOUS | Status: AC
  Filled 2023-09-12: qty 28.35

## 2023-09-12 MED ORDER — ONDANSETRON HCL 4 MG/2ML IJ SOLN: 4.0000 mg | Freq: Once | INTRAMUSCULAR | Status: DC | PRN

## 2023-09-12 MED ORDER — OXYMETAZOLINE HCL 0.05 % NA SOLN
NASAL | Status: DC | PRN
  Administered 2023-09-12: 1

## 2023-09-12 MED ORDER — OXYCODONE HCL 5 MG/5ML PO SOLN: 5.0000 mg | Freq: Once | ORAL | Status: DC | PRN

## 2023-09-12 MED ORDER — BACITRACIN ZINC 500 UNIT/GM EX OINT
TOPICAL_OINTMENT | CUTANEOUS | Status: DC | PRN
  Administered 2023-09-12: 1 via TOPICAL

## 2023-09-12 MED ORDER — ONDANSETRON HCL 4 MG/2ML IJ SOLN
INTRAMUSCULAR | Status: AC
  Filled 2023-09-12: qty 2

## 2023-09-12 MED ORDER — OXYMETAZOLINE HCL 0.05 % NA SOLN
NASAL | Status: AC
  Filled 2023-09-12: qty 30

## 2023-09-12 MED ORDER — MIDAZOLAM HCL 5 MG/5ML IJ SOLN
INTRAMUSCULAR | Status: DC | PRN
  Administered 2023-09-12: 2 mg via INTRAVENOUS

## 2023-09-12 MED ORDER — LIDOCAINE-EPINEPHRINE 1 %-1:100000 IJ SOLN
INTRAMUSCULAR | Status: AC
  Filled 2023-09-12: qty 1

## 2023-09-12 MED ORDER — PROPOFOL 10 MG/ML IV BOLUS
INTRAVENOUS | Status: DC | PRN
  Administered 2023-09-12: 150 mg via INTRAVENOUS

## 2023-09-12 MED ORDER — FENTANYL CITRATE (PF) 100 MCG/2ML IJ SOLN
INTRAMUSCULAR | Status: DC | PRN
  Administered 2023-09-12 (×2): 50 ug via INTRAVENOUS

## 2023-09-12 MED ORDER — ROCURONIUM BROMIDE 100 MG/10ML IV SOLN
INTRAVENOUS | Status: DC | PRN
  Administered 2023-09-12: 60 mg via INTRAVENOUS

## 2023-09-12 MED ORDER — ONDANSETRON HCL 4 MG/2ML IJ SOLN
INTRAMUSCULAR | Status: DC | PRN
  Administered 2023-09-12: 4 mg via INTRAVENOUS

## 2023-09-12 MED ORDER — LACTATED RINGERS IV SOLN: INTRAVENOUS | Status: DC

## 2023-09-12 MED ORDER — PHENYLEPHRINE HCL (PRESSORS) 10 MG/ML IV SOLN
INTRAVENOUS | Status: DC | PRN
Start: 2023-09-12 — End: 2023-09-12
  Administered 2023-09-12 (×2): 80 ug via INTRAVENOUS

## 2023-09-12 MED ORDER — MIDAZOLAM HCL 2 MG/2ML IJ SOLN
INTRAMUSCULAR | Status: AC
  Filled 2023-09-12: qty 2

## 2023-09-12 MED ORDER — MEPERIDINE HCL 25 MG/ML IJ SOLN: 6.2500 mg | INTRAMUSCULAR | Status: DC | PRN

## 2023-09-12 MED ORDER — TRAMADOL HCL 50 MG PO TABS: 50.0000 mg | ORAL_TABLET | Freq: Once | ORAL | Status: DC

## 2023-09-12 MED ORDER — LIDOCAINE HCL (CARDIAC) PF 100 MG/5ML IV SOSY
PREFILLED_SYRINGE | INTRAVENOUS | Status: DC | PRN
  Administered 2023-09-12: 60 mg via INTRAVENOUS

## 2023-09-12 MED ORDER — LIDOCAINE-EPINEPHRINE 1 %-1:100000 IJ SOLN
INTRAMUSCULAR | Status: DC | PRN
  Administered 2023-09-12: 2 mL

## 2023-09-12 MED ORDER — SCOPOLAMINE 1 MG/3DAYS TD PT72
MEDICATED_PATCH | TRANSDERMAL | Status: AC
  Filled 2023-09-12: qty 1

## 2023-09-12 MED ORDER — PROMETHAZINE HCL 25 MG/ML IJ SOLN: 6.2500 mg | INTRAMUSCULAR | Status: DC | PRN

## 2023-09-12 MED ORDER — DEXAMETHASONE SODIUM PHOSPHATE 10 MG/ML IJ SOLN
INTRAMUSCULAR | Status: AC
  Filled 2023-09-12: qty 1

## 2023-09-12 MED ORDER — FENTANYL CITRATE (PF) 100 MCG/2ML IJ SOLN
INTRAMUSCULAR | Status: AC
  Filled 2023-09-12: qty 2

## 2023-09-12 MED ORDER — ACETAMINOPHEN 500 MG PO TABS
ORAL_TABLET | ORAL | Status: AC
  Filled 2023-09-12: qty 2

## 2023-09-12 MED ORDER — SCOPOLAMINE 1 MG/3DAYS TD PT72
1.0000 | MEDICATED_PATCH | TRANSDERMAL | Status: DC
  Administered 2023-09-12: 1.5 mg via TRANSDERMAL

## 2023-09-12 MED ORDER — EPHEDRINE SULFATE (PRESSORS) 50 MG/ML IJ SOLN
INTRAMUSCULAR | Status: DC | PRN
  Administered 2023-09-12 (×2): 5 mg via INTRAVENOUS

## 2023-09-12 MED ORDER — PROPOFOL 10 MG/ML IV BOLUS
INTRAVENOUS | Status: AC
  Filled 2023-09-12: qty 20

## 2023-09-12 MED ORDER — SUGAMMADEX SODIUM 200 MG/2ML IV SOLN
INTRAVENOUS | Status: DC | PRN
Start: 2023-09-12 — End: 2023-09-12
  Administered 2023-09-12: 200 mg via INTRAVENOUS

## 2023-09-12 MED ORDER — HYDROMORPHONE HCL 1 MG/ML IJ SOLN: 0.2500 mg | INTRAMUSCULAR | Status: DC | PRN

## 2023-09-12 MED ORDER — OXYMETAZOLINE HCL 0.05 % NA SOLN
2.0000 | NASAL | Status: DC
  Administered 2023-09-12: 2 via NASAL

## 2023-09-12 MED ORDER — PROMETHAZINE HCL 25 MG/ML IJ SOLN
INTRAMUSCULAR | Status: AC
  Filled 2023-09-12: qty 1

## 2023-09-12 MED ORDER — OXYCODONE HCL 5 MG PO TABS: 5.0000 mg | ORAL_TABLET | Freq: Once | ORAL | Status: DC | PRN

## 2023-09-12 MED ORDER — ACETAMINOPHEN 500 MG PO TABS
1000.0000 mg | ORAL_TABLET | Freq: Once | ORAL | Status: AC
  Administered 2023-09-12: 1000 mg via ORAL

## 2023-09-12 MED ORDER — AMISULPRIDE (ANTIEMETIC) 5 MG/2ML IV SOLN
INTRAVENOUS | Status: AC
  Filled 2023-09-12: qty 4

## 2023-09-12 MED ORDER — DEXMEDETOMIDINE HCL IN NACL 80 MCG/20ML IV SOLN
INTRAVENOUS | Status: DC | PRN
Start: 2023-09-12 — End: 2023-09-12
  Administered 2023-09-12 (×2): 8 ug via INTRAVENOUS
  Administered 2023-09-12: 4 ug via INTRAVENOUS

## 2023-09-12 SURGICAL SUPPLY — 30 items
ATTRACTOMAT 16X20 MAGNETIC DRP (DRAPES) IMPLANT
CANISTER SUCT 1200ML W/VALVE (MISCELLANEOUS) ×2 IMPLANT
COAGULATOR SUCT 8FR VV (MISCELLANEOUS) IMPLANT
DRSG NASOPORE 8CM (GAUZE/BANDAGES/DRESSINGS) IMPLANT
DRSG TELFA 3X8 NADH STRL (GAUZE/BANDAGES/DRESSINGS) IMPLANT
ELECT REM PT RETURN 9FT ADLT (ELECTROSURGICAL) ×1
ELECTRODE REM PT RTRN 9FT ADLT (ELECTROSURGICAL) ×2 IMPLANT
GAUZE 4X4 16PLY ~~LOC~~+RFID DBL (SPONGE) IMPLANT
GAUZE SPONGE 2X2 STRL 8-PLY (GAUZE/BANDAGES/DRESSINGS) ×2 IMPLANT
GLOVE ECLIPSE 7.5 STRL STRAW (GLOVE) ×2 IMPLANT
GOWN STRL REUS W/ TWL LRG LVL3 (GOWN DISPOSABLE) ×4 IMPLANT
GOWN STRL REUS W/TWL LRG LVL3 (GOWN DISPOSABLE) ×2
HEMOSTAT SURGICEL .5X2 ABSORB (HEMOSTASIS) IMPLANT
NDL HYPO 27GX1-1/4 (NEEDLE) ×2 IMPLANT
NEEDLE HYPO 27GX1-1/4 (NEEDLE) ×1
NS IRRIG 1000ML POUR BTL (IV SOLUTION) IMPLANT
PACK BASIN DAY SURGERY FS (CUSTOM PROCEDURE TRAY) ×2 IMPLANT
PACK ENT DAY SURGERY (CUSTOM PROCEDURE TRAY) ×2 IMPLANT
PATTIES SURGICAL .5 X3 (DISPOSABLE) ×2 IMPLANT
SHEET SILICONE 2X3 0.03 REINF (MISCELLANEOUS) IMPLANT
SLEEVE SCD COMPRESS KNEE MED (STOCKING) ×2 IMPLANT
SPIKE FLUID TRANSFER (MISCELLANEOUS) IMPLANT
STRIP CLOSURE SKIN 1/2X4 (GAUZE/BANDAGES/DRESSINGS) IMPLANT
SUT CHROMIC 4 0 P 3 18 (SUTURE) ×2 IMPLANT
SUT ETHILON 3 0 PS 1 (SUTURE) IMPLANT
SUT ETHILON 4 0 CL P 3 (SUTURE) IMPLANT
SUT ETHILON 6 0 P 1 (SUTURE) IMPLANT
SUT PLAIN 4 0 ~~LOC~~ 1 (SUTURE) ×2 IMPLANT
TOWEL GREEN STERILE FF (TOWEL DISPOSABLE) ×2 IMPLANT
YANKAUER SUCT BULB TIP NO VENT (SUCTIONS) ×2 IMPLANT

## 2023-09-12 NOTE — Transfer of Care (Signed)
Immediate Anesthesia Transfer of Care Note  Patient: Madeline Everett  Procedure(s) Performed: NASAL SEPTOPLASTY WITH TURBINATE REDUCTION (Bilateral: Nose)  Patient Location: PACU  Anesthesia Type:General  Level of Consciousness: sedated  Airway & Oxygen Therapy: Patient Spontanous Breathing and Patient connected to face mask oxygen  Post-op Assessment: Report given to RN and Post -op Vital signs reviewed and stable  Post vital signs: Reviewed and stable  Last Vitals:  Vitals Value Taken Time  BP 132/86 09/12/23 0955  Temp 36.3 C 09/12/23 0955  Pulse 63 09/12/23 0956  Resp 11 09/12/23 0956  SpO2 98 % 09/12/23 0956  Vitals shown include unfiled device data.  Last Pain:  Vitals:   09/12/23 0807  TempSrc: Oral  PainSc: 0-No pain         Complications: No notable events documented.

## 2023-09-12 NOTE — Interval H&P Note (Signed)
History and Physical Interval Note:  09/12/2023 8:27 AM  Madeline Everett  has presented today for surgery, with the diagnosis of Sinus pain; Nasal polyp; Snoring.  The various methods of treatment have been discussed with the patient and family. After consideration of risks, benefits and other options for treatment, the patient has consented to  Procedure(s): NASAL SEPTOPLASTY WITH TURBINATE REDUCTION (Bilateral) as a surgical intervention.  The patient's history has been reviewed, patient examined, no change in status, stable for surgery.  I have reviewed the patient's chart and labs.  Questions were answered to the patient's satisfaction.     Serena Colonel

## 2023-09-12 NOTE — Op Note (Signed)
OPERATIVE REPORT  DATE OF SURGERY: 09/12/2023  PATIENT:  Madeline Everett,  47 y.o. female  PRE-OPERATIVE DIAGNOSIS:  Sinus pain; Nasal polyp; Snoring  POST-OPERATIVE DIAGNOSIS:  Sinus pain; Nasal polyp; Snoring  PROCEDURE:  Procedure(s): NASAL SEPTOPLASTY WITH TURBINATE REDUCTION  SURGEON:  Susy Frizzle, MD  ASSISTANTS: none  ANESTHESIA:   General   EBL: 20 ml  DRAINS: none  LOCAL MEDICATIONS USED:  1% Xylocaine with epinephrine  SPECIMEN:  none  COUNTS:  Correct  PROCEDURE DETAILS: The patient was taken to the operating room and placed on the operating table in the supine position. Following induction of general endotracheal anesthesia, the nose was prepped and draped in a standard fashion. Afrin spray was used preoperatively in the holding area. 1% Xylocaine with epinephrine was infiltrated into the septum, the columella, and the inferior turbinates bilaterally.  1. Nasal septoplasty. A left hemitransfixion incision was created with a 15 scalpel. A mucoperichondrial flap was developed posteriorly down the left side of the nasal septum using a Cottle elevator. This was performed all the way to the sphenoid rostrum. The bony cartilaginous junction was divided and a similar flap was developed down the right side. The ethmoid plate was very thin and deflected towards the right side, it was mostly resected.  The maxillary crest was widened bilaterally causing bilateral spurring and this was taken down using the 4 mm osteotome.  2. Submucous resection inferior turbinates bilaterally. The leading edge of the inferior turbinates were incised in a vertical fashion with a #15 scalpel. The Cottle elevator was used to elevate mucosa off bone in all directions. Fragments of the turbinate bone were resected with a Takahashi forceps. The turbinate remnants were outfractured with the Therapist, nutritional.  All of these maneuvers greatly increased the nasal airways bilaterally. The nasal cavities  were packed with rolled up Telfa coated with bacitracin ointment. The pharynx was suctioned of blood and secretions under direct visualization. The patient was awakened extubated and transferred to recovery in stable condition.    PATIENT DISPOSITION:  To PACU, stable

## 2023-09-12 NOTE — Anesthesia Postprocedure Evaluation (Signed)
Anesthesia Post Note  Patient: Madeline Everett  Procedure(s) Performed: NASAL SEPTOPLASTY WITH TURBINATE REDUCTION (Bilateral: Nose)     Patient location during evaluation: PACU Anesthesia Type: General Level of consciousness: awake and alert, oriented and patient cooperative Pain management: pain level controlled Vital Signs Assessment: post-procedure vital signs reviewed and stable Respiratory status: spontaneous breathing, nonlabored ventilation and respiratory function stable Cardiovascular status: blood pressure returned to baseline and stable Postop Assessment: no apparent nausea or vomiting Anesthetic complications: no Comments: PONV resistant to multiple agents   No notable events documented.  Last Vitals:  Vitals:   09/12/23 1030 09/12/23 1057  BP: 120/74 119/85  Pulse: 66 65  Resp: 11 16  Temp:  36.6 C  SpO2: 95% 100%    Last Pain:  Vitals:   09/12/23 1057  TempSrc: Temporal  PainSc: 4                  Lannie Fields

## 2023-09-12 NOTE — Anesthesia Procedure Notes (Signed)
Procedure Name: Intubation Date/Time: 09/12/2023 9:13 AM  Performed by: Burna Cash, CRNAPre-anesthesia Checklist: Patient identified, Emergency Drugs available, Suction available and Patient being monitored Patient Re-evaluated:Patient Re-evaluated prior to induction Oxygen Delivery Method: Circle system utilized Preoxygenation: Pre-oxygenation with 100% oxygen Induction Type: IV induction Ventilation: Mask ventilation without difficulty Laryngoscope Size: Mac and 3 Grade View: Grade I Tube type: Oral Tube size: 7.0 mm Number of attempts: 1 Airway Equipment and Method: Stylet and Oral airway Placement Confirmation: ETT inserted through vocal cords under direct vision, positive ETCO2 and breath sounds checked- equal and bilateral Secured at: 20 cm Tube secured with: Tape Dental Injury: Teeth and Oropharynx as per pre-operative assessment

## 2023-09-12 NOTE — Discharge Instructions (Addendum)
Use Tylenol and/or Motrin as needed for pain.  Avoid heavy lifting or bending over.  No Tylenol before 2:15pm today.   Post Anesthesia Home Care Instructions  Activity: Get plenty of rest for the remainder of the day. A responsible individual must stay with you for 24 hours following the procedure.  For the next 24 hours, DO NOT: -Drive a car -Advertising copywriter -Drink alcoholic beverages -Take any medication unless instructed by your physician -Make any legal decisions or sign important papers.  Meals: Start with liquid foods such as gelatin or soup. Progress to regular foods as tolerated. Avoid greasy, spicy, heavy foods. If nausea and/or vomiting occur, drink only clear liquids until the nausea and/or vomiting subsides. Call your physician if vomiting continues.  Special Instructions/Symptoms: Your throat may feel dry or sore from the anesthesia or the breathing tube placed in your throat during surgery. If this causes discomfort, gargle with warm salt water. The discomfort should disappear within 24 hours.  If you had a scopolamine patch placed behind your ear for the management of post- operative nausea and/or vomiting:  1. The medication in the patch is effective for 72 hours, after which it should be removed.  Wrap patch in a tissue and discard in the trash. Wash hands thoroughly with soap and water. 2. You may remove the patch earlier than 72 hours if you experience unpleasant side effects which may include dry mouth, dizziness or visual disturbances. 3. Avoid touching the patch. Wash your hands with soap and water after contact with the patch.

## 2023-09-13 ENCOUNTER — Encounter (HOSPITAL_BASED_OUTPATIENT_CLINIC_OR_DEPARTMENT_OTHER): Payer: Self-pay | Admitting: Otolaryngology

## 2023-09-16 DIAGNOSIS — J342 Deviated nasal septum: Secondary | ICD-10-CM | POA: Diagnosis not present

## 2023-09-19 ENCOUNTER — Ambulatory Visit: Payer: BC Managed Care – PPO | Attending: Family Medicine

## 2023-10-07 DIAGNOSIS — J342 Deviated nasal septum: Secondary | ICD-10-CM | POA: Diagnosis not present

## 2023-10-12 ENCOUNTER — Encounter: Payer: Self-pay | Admitting: Psychiatry

## 2023-12-12 ENCOUNTER — Encounter (HOSPITAL_COMMUNITY): Payer: Self-pay | Admitting: Emergency Medicine

## 2023-12-12 ENCOUNTER — Emergency Department (HOSPITAL_COMMUNITY): Payer: BC Managed Care – PPO

## 2023-12-12 ENCOUNTER — Emergency Department (HOSPITAL_COMMUNITY)
Admission: EM | Admit: 2023-12-12 | Discharge: 2023-12-12 | Disposition: A | Discharge status: Home / Self Care | Payer: BC Managed Care – PPO | Attending: Emergency Medicine | Admitting: Emergency Medicine

## 2023-12-12 ENCOUNTER — Other Ambulatory Visit: Payer: Self-pay

## 2023-12-12 ENCOUNTER — Telehealth: Payer: Self-pay | Admitting: Cardiology

## 2023-12-12 DIAGNOSIS — Z7982 Long term (current) use of aspirin: Secondary | ICD-10-CM | POA: Insufficient documentation

## 2023-12-12 DIAGNOSIS — R0789 Other chest pain: Secondary | ICD-10-CM | POA: Insufficient documentation

## 2023-12-12 DIAGNOSIS — R079 Chest pain, unspecified: Secondary | ICD-10-CM | POA: Diagnosis not present

## 2023-12-12 LAB — BASIC METABOLIC PANEL
Anion gap: 8 (ref 5–15)
BUN: 12 mg/dL (ref 6–20)
CO2: 21 mmol/L — ABNORMAL LOW (ref 22–32)
Calcium: 9.1 mg/dL (ref 8.9–10.3)
Chloride: 110 mmol/L (ref 98–111)
Creatinine, Ser: 0.84 mg/dL (ref 0.44–1.00)
GFR, Estimated: >60 mL/min (ref >60)
Glucose, Bld: 94 mg/dL (ref 70–99)
Potassium: 4.4 mmol/L (ref 3.5–5.1)
Sodium: 139 mmol/L (ref 135–145)

## 2023-12-12 LAB — CBC
HCT: 44.1 % (ref 36.0–46.0)
Hemoglobin: 14.3 g/dL (ref 12.0–15.0)
MCH: 28.5 pg (ref 26.0–34.0)
MCHC: 32.4 g/dL (ref 30.0–36.0)
MCV: 87.8 fL (ref 80.0–100.0)
Platelets: 282 10*3/uL (ref 150–400)
RBC: 5.02 MIL/uL (ref 3.87–5.11)
RDW: 12.7 % (ref 11.5–15.5)
WBC: 9.4 10*3/uL (ref 4.0–10.5)
nRBC: 0 % (ref 0.0–0.2)

## 2023-12-12 LAB — TROPONIN I (HIGH SENSITIVITY)
Troponin I (High Sensitivity): 4 ng/L (ref <18)
Troponin I (High Sensitivity): 3 ng/L (ref <18)

## 2023-12-12 NOTE — Telephone Encounter (Signed)
   Pt c/o of Chest Pain: STAT if active CP, including tightness, pressure, jaw pain, radiating pain to shoulder/upper arm/back, CP unrelieved by Nitro. Symptoms reported of SOB, nausea, vomiting, sweating.  1. Are you having CP right now? No   2. Are you experiencing any other symptoms (ex. SOB, nausea, vomiting, sweating)? SOB and fatigue    3. Is your CP continuous or coming and going? Coming and going   4. Have you taken Nitroglycerin? No   5. How long have you been experiencing CP? Started last night    6. If NO CP at time of call then end call with telling Pt to call back or call 911 if Chest pain returns prior to return call from triage team.

## 2023-12-12 NOTE — Discharge Instructions (Signed)
 It is unclear what is causing your chest pain.  You may take ibuprofen  and/or Tylenol  to help.  Follow-up closely with your cardiologist.  If you develop new or worsening chest pain, shortness of breath, or any other new/concerning symptoms then return to the ER or call 911.

## 2023-12-12 NOTE — Telephone Encounter (Signed)
 Spoke with patient and she states she is having chest tightness, SOB and fatigue.  Advised to call 911 and go to the ED

## 2023-12-12 NOTE — ED Provider Triage Note (Signed)
 Emergency Medicine Provider Triage Evaluation Note  Madeline Everett , a 49 y.o. female  was evaluated in triage.  Pt complains of chest pain.  States same began initially last night when she was lying in bed and was severe under her left breast.  It lasted about 5 minutes and resolved spontaneously.  She woke up this morning in good health, and throughout the day noticed some soreness in her left chest and into her jaw as well.  Denies history of similar.  Notes history of bicuspid aortic valve and PVCs for which she takes propranolol .  Review of Systems  Positive:  Negative:   Physical Exam  BP 125/87 (BP Location: Right Arm)   Pulse 70   Temp 98.1 F (36.7 C) (Oral)   Resp 18   Ht 5' 4 (1.626 m)   Wt 71.2 kg   LMP 02/13/2012 (Exact Date)   SpO2 100%   BMI 26.95 kg/m  Gen:   Awake, no distress   Resp:  Normal effort  MSK:   Moves extremities without difficulty  Other:    Medical Decision Making  Medically screening exam initiated at 1:16 PM.  Appropriate orders placed.  BRITNI DRISCOLL was informed that the remainder of the evaluation will be completed by another provider, this initial triage assessment does not replace that evaluation, and the importance of remaining in the ED until their evaluation is complete.  Workup initiated   Javaris Wigington A, PA-C 12/12/23 1318

## 2023-12-12 NOTE — ED Triage Notes (Signed)
 Pt via POV sent by PCP for left chest pain starting last night at 11pm, radiating to central chest. Pain lasted about 5 mins and resolved. Pt felt fine when she woke up but has developed tightness in her mid chest with pain to left jaw and left shoulder. CP currently rated 2/10 in chest and jaw. Pt appears anxious and notes hx of same. Pt feels nauseated but denies sweating, dizziness, SOB.

## 2023-12-12 NOTE — ED Provider Notes (Signed)
 [place] EMERGENCY DEPARTMENT AT [facility] Provider Note   CSN: [id number] Arrival date & time: 12/12/23  1215     History  Chief Complaint  Patient presents with   Chest Pain    Madeline Everett is a 48 y.o. female.  HPI 48 year old female presents with chest pain.  Last night around 11 PM she was lying on her side and noticed chest pain that made her have to sit up.  Overall last about 5 minutes.  It then went away.  However between 9 and 10 AM she developed recurrent chest pain but not as bad.  That has been there ever since.  She also felt a little short of breath which has resolved.  She is also started develop a little bit of pain going into her left neck/trapezius.  No pain radiating straight through to her back.  No abdominal pain.  No recent leg swelling, travel, surgery, or DVT/PE history.  She has a history of PVCs as well as a bicuspid aortic valve.  She denies a history of hypertension, hyperlipidemia, diabetes, smoking or history of early coronary disease in a primary family member.  Home Medications Prior to Admission medications   Medication Sig Start Date End Date Taking? Authorizing Provider  albuterol (PROVENTIL HFA;VENTOLIN HFA) 108 (90 BASE) MCG/ACT inhaler Inhale 1-2 puffs into the lungs every 6 (six) hours as needed for wheezing or shortness of breath.    [provider]  aspirin  EC 81 MG tablet Take 1 tablet (81 mg total) by mouth daily. Swallow whole. 07/06/23   Shlomo Wilbert SAUNDERS, MD  b complex vitamins capsule Take 1 capsule by mouth daily.    [provider]  buPROPion  (WELLBUTRIN  SR) 150 MG 12 hr tablet Take 1 tablet (150 mg total) by mouth 2 (two) times daily. 09/09/23   Franchot Raisin, PMHNP  cholecalciferol (VITAMIN D3) 25 MCG (1000 UNIT) tablet Take 1,000 Units by mouth daily.    [provider]  desvenlafaxine  (PRISTIQ ) 50 MG 24 hr tablet Take 1 tablet (50 mg total) by mouth daily. 09/09/23   Franchot Raisin, PMHNP   docusate sodium (COLACE) 100 MG capsule Take 100 mg by mouth daily as needed for mild constipation.    [provider]  famotidine (PEPCID) 20 MG tablet Take 20 mg by mouth as needed for heartburn or indigestion.    [provider]  FLOVENT HFA 110 MCG/ACT inhaler Inhale 2 puffs into the lungs 2 (two) times daily. 07/07/22   [provider]  fluticasone (FLONASE) 50 MCG/ACT nasal spray Place into both nostrils as needed.    [provider]  Magnesium 250 MG TABS Take 250 mg by mouth every evening.     [provider]  Omega-3 Fatty Acids (FISH OIL ) 435 MG CAPS Take 1 capsule (435 mg total) by mouth daily. 07/19/23   Shlomo Wilbert SAUNDERS, MD  ondansetron  (ZOFRAN -ODT) 4 MG disintegrating tablet Take 4 mg by mouth daily as needed. 07/22/22   [provider]  Probiotic Product (ALIGN) 4 MG CAPS Take 4 mg by mouth daily.    [provider]  propranolol  ER (INDERAL  LA) 60 MG 24 hr capsule TAKE 1 CAPSULE BY MOUTH EVERY DAY 12/23/22   Miriam Norris, NP  rizatriptan (MAXALT) 10 MG tablet Take 10 mg by mouth as needed for migraine. May repeat in 2 hours if needed    [provider]  rosuvastatin  (CRESTOR ) 20 MG tablet Take 1 tablet (20 mg total) by  mouth daily. 07/19/23 10/17/23  Shlomo Wilbert SAUNDERS, MD  Topiramate ER 150 MG CS24 Take 150 mg by mouth at bedtime.     [provider]  VASCEPA 1 g capsule Take 2 g by mouth 2 (two) times daily. 07/17/23   [provider]      Allergies    Ciprofloxacin , Penicillins, Amoxicillin, Codeine, Fluoxetine , Guaifenesin & derivatives, Hydrocodone -acetaminophen , Iodine, Lactose intolerance (gi), Lactulose, Paxil [paroxetine hcl], and Percocet [oxycodone -acetaminophen ]    Review of Systems   Review of Systems  Constitutional:  Negative for diaphoresis.  Respiratory:  Positive for shortness of breath.   Cardiovascular:  Positive for chest pain.  Gastrointestinal:  Negative for vomiting.     Physical Exam Updated Vital Signs BP 125/87 (BP Location: Right Arm)   Pulse 70   Temp 98.1 F (36.7 C) (Oral)   Resp 18   Ht 5' 4 (1.626 m)   Wt 71.2 kg   LMP 02/13/2012 (Exact Date)   SpO2 100%   BMI 26.95 kg/m  Physical Exam Vitals and nursing note reviewed.  Constitutional:      General: She is not in acute distress.    Appearance: She is well-developed. She is not ill-appearing or diaphoretic.  HENT:     Head: Normocephalic and atraumatic.  Cardiovascular:     Rate and Rhythm: Normal rate and regular rhythm.     Heart sounds: Normal heart sounds.  Pulmonary:     Effort: Pulmonary effort is normal.     Breath sounds: Normal breath sounds.  Abdominal:     Palpations: Abdomen is soft.     Tenderness: There is no abdominal tenderness.  Musculoskeletal:     Right lower leg: No tenderness. No edema.     Left lower leg: No tenderness. No edema.  Skin:    General: Skin is warm and dry.  Neurological:     Mental Status: She is alert.     ED Results / Procedures / Treatments   Labs (all labs ordered are listed, but only abnormal results are displayed) Labs Reviewed  BASIC METABOLIC PANEL - Abnormal; Notable for the following components:      Result Value   CO2 21 (*)    All other components within normal limits  CBC  TROPONIN I (HIGH SENSITIVITY)  TROPONIN I (HIGH SENSITIVITY)    EKG EKG Interpretation Date/Time:  Monday December 12 2023 16:21:08 EST Ventricular Rate:  67 PR Interval:  152 QRS Duration:  78 QT Interval:  384 QTC Calculation: 405 R Axis:   86  Text Interpretation: Normal sinus rhythm no acute ST/T changes overall similar to Oct 2024 Confirmed by Freddi Hamilton [phone]) on 12/12/2023 4:28:52 PM  Radiology DG Chest 2 View Result Date: 12/12/2023 CLINICAL DATA:  Chest pain. EXAM: CHEST - 2 VIEW COMPARISON:  Chest radiograph 06/15/2007 FINDINGS: The heart size and mediastinal contours are within normal limits. Both lungs are clear. The  visualized skeletal structures are unremarkable. IMPRESSION: No active cardiopulmonary disease. Electronically Signed   By: Bard Moats M.D.   On: 12/12/2023 14:04    Procedures Procedures    Medications Ordered in ED Medications - No data to display  ED Course/ Medical Decision Making/ A&P                                 Medical Decision Making Amount and/or Complexity of Data Reviewed Labs: ordered.    Details: Normal troponins  x 2 Radiology: ordered and independent interpretation performed.    Details: No pneumothorax ECG/medicine tests: ordered and independent interpretation performed.    Details: Remarkable, no ischemia   Patient's chest pain is atypical.  Low suspicion for ACS, PE, dissection.  No signs of infection.  Vital signs are unremarkable.  Patient is PERC negative.  At this point, I think she is stable for discharge to follow-up with her PCP.  We discussed return precautions.        Final Clinical Impression(s) / ED Diagnoses Final diagnoses:  None    Rx / DC Orders ED Discharge Orders     None         Freddi Hamilton, MD 12/12/23 1657

## 2023-12-23 DIAGNOSIS — F331 Major depressive disorder, recurrent, moderate: Secondary | ICD-10-CM | POA: Diagnosis not present

## 2023-12-23 DIAGNOSIS — F410 Panic disorder [episodic paroxysmal anxiety] without agoraphobia: Secondary | ICD-10-CM | POA: Diagnosis not present

## 2023-12-23 DIAGNOSIS — F411 Generalized anxiety disorder: Secondary | ICD-10-CM | POA: Diagnosis not present

## 2024-01-11 ENCOUNTER — Other Ambulatory Visit: Payer: Self-pay | Admitting: Nurse Practitioner

## 2024-01-19 DIAGNOSIS — F330 Major depressive disorder, recurrent, mild: Secondary | ICD-10-CM | POA: Diagnosis not present

## 2024-01-19 DIAGNOSIS — F410 Panic disorder [episodic paroxysmal anxiety] without agoraphobia: Secondary | ICD-10-CM | POA: Diagnosis not present

## 2024-01-19 DIAGNOSIS — F411 Generalized anxiety disorder: Secondary | ICD-10-CM | POA: Diagnosis not present

## 2024-02-03 ENCOUNTER — Other Ambulatory Visit: Payer: Self-pay | Admitting: Cardiology

## 2024-02-14 ENCOUNTER — Telehealth: Payer: Self-pay | Admitting: Cardiology

## 2024-02-14 MED ORDER — PROPRANOLOL HCL ER 60 MG PO CP24: 60.0000 mg | ORAL_CAPSULE | Freq: Every day | ORAL | 2 refills | Status: DC

## 2024-02-14 NOTE — Telephone Encounter (Signed)
*  STAT* If patient is at the pharmacy, call can be transferred to refill team.   1. Which medications need to be refilled? (please list name of each medication and dose if known)  propranolol ER (INDERAL LA) 60 MG 24 hr capsule    2. Would you like to learn more about the convenience, safety, & potential cost savings by using the [facility]? N/A   3. Are you open to using the [facility] (Type [facility]. N/A   4. Which pharmacy/location (including street and city if local pharmacy) is medication to be sent to?  [id number] - [place], [place] - [address]     5. Do they need a 30 day or 90 day supply? Needs enough medication to last her until 05/12 appt. She is completley out.

## 2024-02-14 NOTE — Telephone Encounter (Signed)
 Pt's medication was sent to pt's pharmacy as requested. Confirmation received.

## 2024-02-17 DIAGNOSIS — R102 Pelvic and perineal pain: Secondary | ICD-10-CM | POA: Diagnosis not present

## 2024-02-23 DIAGNOSIS — R102 Pelvic and perineal pain: Secondary | ICD-10-CM | POA: Diagnosis not present

## 2024-02-23 DIAGNOSIS — N943 Premenstrual tension syndrome: Secondary | ICD-10-CM | POA: Diagnosis not present

## 2024-02-26 NOTE — Progress Notes (Unsigned)
 Cardiology Office Note:  .   Date:  02/26/2024  ID:  Madeline Everett, DOB [birth year], MRN [id number] PCP: Laurann Montana, MD  [facility] Providers Cardiologist:  Armanda Magic, MD {  History of Present Illness: .   Madeline Everett is a 48 y.o. female w/PMHx of  HTN, bicuspid AV, SVT (unknown when) PVCs  Saw Dr. Ladona Ridgel 09/07/22 feeling better/less PVCs on propranolol  Suspected RV vs LVOT origin Given improved burden, no changes  Saw cards APP 09/29/22, doing well, better management of symptoms/triggers, anxiety. C.MRI with no significant findings Planned echos every few years to follow her AV  ER visit 12/12/23 for CP, occurred at rest/in bed, felt to be atypical Discharged fro the ER K+ 4. HS Trop 4, 3 WBC 9.4 H/H 14/44 Plts 282  Today's visit is scheduled as an annual visit  ROS:   She is doing well Jan ER visit prompted by CP/SOB Was in bed developed epigastric pain, radiated laterally to the left and diffusely through to her back really became intense > scary > SOB. Tried to settle/relax > by the next day went in to be checked Was given reassurance that her labs/ER evaluation were good Did already have an appt with her doctor, suspected to have been anxiety/stress provoked and her pristiq was adjusted She has not had recurrent episodes/symptoms  She is busy/active, working, daring for her family/home Not formally exercising but she/her husband have built a garage gym and plans to get going  No near syncope or syncope No palpitations   Arrhythmia/AAD hx PVCs No AAD to date  Studies Reviewed: Marland Kitchen    EKG 12/12/23 EKGs reviewed by myself:  SR 75bpm, artifact, no clear ischemic changes, no PVCs SR 67bpm, no ST/T changes, no PVCs  06/29/23: Coronary Ca++ IMPRESSION: Coronary calcium score of 30.3. This was 61 percentile for age-, race-, and sex-matched controls.    09/29/22: c.MRI IMPRESSION: 1.  Normal LV size and function EF 59% 2.  No delayed  gadolinium uptake scar of infarct 3.  Normal RV size and function RVEF 55% no evidence of RV dysplasia 4. Bicuspid Sievers type 0 AV with no significant AR and AVA by planimetry 3 cm2 5.  Normal ascending thoracic aorta 3.2 cm with no coarctation 6.  Mild MV thickening with mild appearing MR 7.  No pericardial effusion 8.  Normal parametric measure see values abo   06/2022 Monitor   Predominant rhythm is normal sinus rhythm with average heart rate 83bpm and ranged from 55 to 200bpm.   Frequent PVCs, bigeminal and trigeminal PVCs, vetricular couplets, triplets and nonsustained ventricular tachycardia up to 8 beats at 200bpm.  PVC load 16.1%.   Echo 11/2021  1. Left ventricular ejection fraction by 3D volume is 52 %. The left  ventricle has low normal function. The left ventricle has no regional wall  motion abnormalities. Left ventricular diastolic parameters are consistent  with Grade I diastolic dysfunction   (impaired relaxation). The average left ventricular global longitudinal  strain is -19.2 %. The global longitudinal strain is normal.   2. Right ventricular systolic function is normal. The right ventricular  size is normal.   3. The mitral valve is grossly normal. Mild mitral valve regurgitation.  No evidence of mitral stenosis.   4. Aortic valve previously described as bicuspid. The aortic valve was  not well visualized. Aortic valve regurgitation is not visualized. No  aortic stenosis is present.   5. The inferior vena cava is normal  in size with greater than 50%  respiratory variability, suggesting right atrial pressure of 3 mmHg.   Comparison(s): 12/24/19 EF 55%. GLS -18.4%.   Risk Assessment/Calculations:    Physical Exam:   VS:  LMP 02/13/2012 (Exact Date)    Wt Readings from Last 3 Encounters:  12/12/23 157 lb (71.2 kg)  09/12/23 154 lb 15.7 oz (70.3 kg)  05/25/23 156 lb (70.8 kg)    GEN: Well nourished, well developed in no acute distress NECK: No JVD; No  carotid bruits CARDIAC: RRR, no murmurs, rubs, gallops RESPIRATORY:  CTA b/l without rales, wheezing or rhonchi  ABDOMEN: Soft, non-tender, non-distended EXTREMITIES: No edema; No deformity   ASSESSMENT AND PLAN: .    PVCs None on her EKGs or exam'no palpitations  HTN Looks great  3.   Bicuspid AV Sees cardiology team soon   I dont think she needs to follow with both EP/gen cards teams Can see EP as needed, she is comfortable with that     Dispo: f/u with Mischelle as scheduled, sooner if needed  Signed, Sheilah Pigeon, PA-C

## 2024-03-01 ENCOUNTER — Encounter: Payer: Self-pay | Admitting: Physician Assistant

## 2024-03-01 ENCOUNTER — Ambulatory Visit: Attending: Physician Assistant | Admitting: Physician Assistant

## 2024-03-01 VITALS — BP 110/82 | HR 74 | Ht 64.0 in | Wt 160.8 lb

## 2024-03-01 DIAGNOSIS — I10 Essential (primary) hypertension: Secondary | ICD-10-CM | POA: Diagnosis not present

## 2024-03-01 DIAGNOSIS — Q2381 Bicuspid aortic valve: Secondary | ICD-10-CM | POA: Diagnosis not present

## 2024-03-01 DIAGNOSIS — I493 Ventricular premature depolarization: Secondary | ICD-10-CM

## 2024-03-01 NOTE — Patient Instructions (Signed)
 Medication Instructions:   Your physician recommends that you continue on your current medications as directed. Please refer to the Current Medication list given to you today.  *If you need a refill on your cardiac medications before your next appointment, please call your pharmacy*  Lab Work: NONE ORDERED  TODAY   If you have labs (blood work) drawn today and your tests are completely normal, you will receive your results only by: MyChart Message (if you have MyChart) OR A paper copy in the mail If you have any lab test that is abnormal or we need to change your treatment, we will call you to review the results.  Testing/Procedures: NONE ORDERED  TODAY    Follow-Up: At [facility], you and your health needs are our priority.  As part of our continuing mission to provide you with exceptional heart care, our providers are all part of one team.  This team includes your primary Cardiologist (physician) and Advanced Practice Providers or APPs (Physician Assistants and Nurse Practitioners) who all work together to provide you with the care you need, when you need it.  Your next appointment:    FOR EP DEPARTMENT CONTACT [facility] [phone] AS NEEDED FOR  ANY CARDIAC RELATED SYMPTOMS    We recommend signing up for the patient portal called "MyChart".  Sign up information is provided on this After Visit Summary.  MyChart is used to connect with patients for Virtual Visits (Telemedicine).  Patients are able to view lab/test results, encounter notes, upcoming appointments, etc.  Non-urgent messages can be sent to your provider as well.   To learn more about what you can do with MyChart, go to [email or web address].   Other Instructions       1st Floor: - Lobby - Registration  - Pharmacy  - Lab - Cafe  2nd Floor: - PV Lab - Diagnostic Testing (echo, CT, nuclear med)  3rd Floor: - Vacant  4th Floor: - TCTS (cardiothoracic surgery) - AFib Clinic -  Structural Heart Clinic - Vascular Surgery  - Vascular Ultrasound  5th Floor: - HeartCare Cardiology (general and EP) - Clinical Pharmacy for coumadin, hypertension, lipid, weight-loss medications, and med management appointments    Valet parking services will be available as well.

## 2024-03-09 DIAGNOSIS — M542 Cervicalgia: Secondary | ICD-10-CM | POA: Diagnosis not present

## 2024-03-09 DIAGNOSIS — G43719 Chronic migraine without aura, intractable, without status migrainosus: Secondary | ICD-10-CM | POA: Diagnosis not present

## 2024-03-28 NOTE — Progress Notes (Deleted)
 Cardiology Office Note:  .   Date:  03/28/2024  ID:  Madeline Everett, DOB 1976-03-24, MRN [id number] PCP: Victorio Grave, MD  [facility] Providers Cardiologist:  Gaylyn Keas, MD { Click to update primary MD,subspecialty MD or APP then REFRESH:1}   History of Present Illness: .   Madeline Everett is a 48 y.o. female   w/PMHx of  HTN, bicuspid AV, SVT (unknown when) PVCs   Saw Dr. Carolynne Citron 09/07/22 feeling better/less PVCs on propranolol   Suspected RV vs LVOT origin Given improved burden, no changes  C.MRI with no significant findings Planned echos every few years to follow her AV    ROS: ***  Studies Reviewed: Aaron Aas         Prior CV Studies: {Select studies to display:26339}    06/29/23: Coronary Ca++ IMPRESSION: Coronary calcium  score of 30.3. This was 76 percentile for age-, race-, and sex-matched controls.     09/29/22: c.MRI IMPRESSION: 1.  Normal LV size and function EF 59% 2.  No delayed gadolinium uptake scar of infarct 3.  Normal RV size and function RVEF 55% no evidence of RV dysplasia 4. Bicuspid Sievers type 0 AV with no significant AR and AVA by planimetry 3 cm2 5.  Normal ascending thoracic aorta 3.2 cm with no coarctation 6.  Mild MV thickening with mild appearing MR 7.  No pericardial effusion 8.  Normal parametric measure see values abo     06/2022 Monitor   Predominant rhythm is normal sinus rhythm with average heart rate 83bpm and ranged from 55 to 200bpm.   Frequent PVCs, bigeminal and trigeminal PVCs, vetricular couplets, triplets and nonsustained ventricular tachycardia up to 8 beats at 200bpm.  PVC load 16.1%.   Echo 11/2021  1. Left ventricular ejection fraction by 3D volume is 52 %. The left  ventricle has low normal function. The left ventricle has no regional wall  motion abnormalities. Left ventricular diastolic parameters are consistent  with Grade I diastolic dysfunction   (impaired relaxation). The average left ventricular global  longitudinal  strain is -19.2 %. The global longitudinal strain is normal.   2. Right ventricular systolic function is normal. The right ventricular  size is normal.   3. The mitral valve is grossly normal. Mild mitral valve regurgitation.  No evidence of mitral stenosis.   4. Aortic valve previously described as bicuspid. The aortic valve was  not well visualized. Aortic valve regurgitation is not visualized. No  aortic stenosis is present.   5. The inferior vena cava is normal in size with greater than 50%  respiratory variability, suggesting right atrial pressure of 3 mmHg.   Comparison(s): 12/24/19 EF 55%. GLS -18.4%.      Risk Assessment/Calculations:   {Does this patient have ATRIAL FIBRILLATION?:[id number]} No BP recorded.  {Refresh Note OR Click here to enter BP  :1}***       Physical Exam:   VS:  LMP 02/13/2012 (Exact Date)    Wt Readings from Last 3 Encounters:  03/01/24 160 lb 12.8 oz (72.9 kg)  12/12/23 157 lb (71.2 kg)  09/12/23 154 lb 15.7 oz (70.3 kg)    GEN: Well nourished, well developed in no acute distress NECK: No JVD; No carotid bruits CARDIAC: ***RRR, no murmurs, rubs, gallops RESPIRATORY:  Clear to auscultation without rales, wheezing or rhonchi  ABDOMEN: Soft, non-tender, non-distended EXTREMITIES:  No edema; No deformity   ASSESSMENT AND PLAN: .    Bicuspid AV-last echo 11/2021  HTN  Elevaated coronary calcium   score 30 05/2023  PVC's improved on propanolol  History of PSVT on BB     {Are you ordering a CV Procedure (e.g. stress test, cath, DCCV, TEE, etc)?   Press F2        :[id number]}  Dispo: ***  Signed, Theotis Flake, PA-C

## 2024-04-09 ENCOUNTER — Ambulatory Visit: Admitting: Physician Assistant

## 2024-04-17 DIAGNOSIS — K649 Unspecified hemorrhoids: Secondary | ICD-10-CM | POA: Diagnosis not present

## 2024-04-17 DIAGNOSIS — R197 Diarrhea, unspecified: Secondary | ICD-10-CM | POA: Diagnosis not present

## 2024-04-17 DIAGNOSIS — R194 Change in bowel habit: Secondary | ICD-10-CM | POA: Diagnosis not present

## 2024-04-27 ENCOUNTER — Ambulatory Visit: Attending: Emergency Medicine | Admitting: Emergency Medicine

## 2024-04-27 ENCOUNTER — Encounter: Payer: Self-pay | Admitting: Emergency Medicine

## 2024-04-27 VITALS — BP 110/80 | HR 79 | Ht 64.0 in | Wt 158.0 lb

## 2024-04-27 DIAGNOSIS — Q2381 Bicuspid aortic valve: Secondary | ICD-10-CM

## 2024-04-27 DIAGNOSIS — R197 Diarrhea, unspecified: Secondary | ICD-10-CM | POA: Diagnosis not present

## 2024-04-27 DIAGNOSIS — I2510 Atherosclerotic heart disease of native coronary artery without angina pectoris: Secondary | ICD-10-CM | POA: Diagnosis not present

## 2024-04-27 DIAGNOSIS — I340 Nonrheumatic mitral (valve) insufficiency: Secondary | ICD-10-CM | POA: Diagnosis not present

## 2024-04-27 DIAGNOSIS — I10 Essential (primary) hypertension: Secondary | ICD-10-CM

## 2024-04-27 DIAGNOSIS — E785 Hyperlipidemia, unspecified: Secondary | ICD-10-CM

## 2024-04-27 DIAGNOSIS — I493 Ventricular premature depolarization: Secondary | ICD-10-CM

## 2024-04-27 DIAGNOSIS — R194 Change in bowel habit: Secondary | ICD-10-CM | POA: Diagnosis not present

## 2024-04-27 NOTE — Patient Instructions (Addendum)
 Medication Instructions:  NO CHANGES  Lab Work: NONE  Testing/Procedures: Your physician has requested that you have an echocardiogram. Echocardiography is a painless test that uses sound waves to create images of your heart. It provides your doctor with information about the size and shape of your heart and how well your heart's chambers and valves are working. This procedure takes approximately one hour. There are no restrictions for this procedure. Please do NOT wear cologne, perfume, aftershave, or lotions (deodorant is allowed). Please arrive 15 minutes prior to your appointment time.  Please note: We ask at that you not bring children with you during ultrasound (echo/ vascular) testing. Due to room size and safety concerns, children are not allowed in the ultrasound rooms during exams. Our front office staff cannot provide observation of children in our lobby area while testing is being conducted. An adult accompanying a patient to their appointment will only be allowed in the ultrasound room at the discretion of the ultrasound technician under special circumstances. We apologize for any inconvenience.   Follow-Up: At [facility], you and your health needs are our priority.  As part of our continuing mission to provide you with exceptional heart care, our providers are all part of one team.  This team includes your primary Cardiologist (physician) and Advanced Practice Providers or APPs (Physician Assistants and Nurse Practitioners) who all work together to provide you with the care you need, when you need it.  Your next appointment:   1 YEAR  Provider:   Gaylyn Keas, MD OR Palmer Bobo, DNP

## 2024-04-27 NOTE — Progress Notes (Signed)
 Cardiology Office Note:    Date:  04/27/2024  ID:  Madeline Everett, DOB Jan 14, 1976, MRN [id number] PCP: Victorio Grave, MD  [facility] Providers Cardiologist:  Gaylyn Keas, MD       Patient Profile:       Chief Complaint: Follow-up bicuspid aortic valve History of Present Illness:  Madeline Everett is a 48 y.o. female with visit-pertinent history of hypertension, SVT, bicuspid aortic valve, anxiety, depression, IBS, frequent PVCs  She has previously established care with Dr. Micael Adas for palpitations and known bicuspid aortic valve.  Remote NST 2015 was normal.  Cardiac MRI on 2017 showed normal EF, normal aorta, bicuspid aortic valve without stenosis or regurgitation.  Last echocardiogram 11/2021 showed LVEF 52%, grade 1 DD, normal RV, mild MR, AV not well-visualized but no AI/AAS.  She has also worn several prior monitors in 2017 (felt benign - NSR/avg 85 bpm with occasional PACs/PVCs) and 2021 (felt normal without significant arrhythmias, avg 91bpm). The diagnosis of SVT pre-dated these monitors.  It was noted that the heart monitors were obtained in the context of having spells were all of a sudden she could not control her body and she was referred to neurology.  Dr. Tilda Fogo diagnosed her with vasovagal spells and adjusted her anxiety medication.  There were no corresponding arrhythmias on her monitor.  However she did develop worsening palpitations on a repeat monitor.  She was transition to long-acting propranolol .  Monitor resulted in 05/2022 with frequent PVCs at 16.1% as well as 1 run of NSVT and 1 run of SVT with rare PACs.  Dr. Micael Adas recommended repeat cardiac MRI and EP evaluation.  She saw Dr. Carolynne Citron who recommended to continue the propranolol  and reserve additional therapy for any worsening symptoms.  Cardiac MRI showed normal LV size, no delayed GDMT, no RV dysplasia, no significant AR, mild MR, normal thoracic aorta.  She was last seen by general cardiology on 09/29/2022.  She  had noted she feels she is doing well on propranolol .  She has noted long history of intermittent exertional dyspnea.  Her palpitations are much less frequent.  She identified triggers of stress and increased caffeine and tries to limit these.  She did have CT cardiac scoring on 05/2023 showing coronary calcium  score 30.3 (96 percentile)  Most recently she was seen by EP APP on 03/01/2024.  She is doing well PVCs are under good control.  She was to follow-up with general cardiology and EP as needed.   Discussed the use of AI scribe software for clinical note transcription with the patient, who gave verbal consent to proceed.  History of Present Illness Madeline Everett is a 48 year old female with a bicuspid aortic valve who presents for follow-up on her cardiac condition.  She has a history of palpitations and PVCs, managed with propranolol , which controls her symptoms.  Her biggest stressor for her PVCs is her anxiety and stress which recently has been coming from her work. She denies shortness of breath, chest pain, or syncope but experiences fatigue and occasional lightheadedness, attributed to irregular eating.  In January, she experienced severe chest pain and visited the emergency room. Workup showed normal troponin levels and a normal EKG. It was felt her CP was related to her anxiety.  Her PCP prescribed her lorazepam which she uses sparingly.   She is on Crestor  and Vascepa for lipid management.   She works as a Contractor at [employer], where work-related stress contributes to her anxiety.  Her job involves physical activity, leading to leg swelling that resolves overnight.  Review of systems:  Please see the history of present illness. All other systems are reviewed and otherwise negative.      Studies Reviewed:        CT cardiac scoring 06/29/2023 Coronary calcium  score of 30.3. This was 77 percentile for age-, race-, and sex-matched controls.  Cardiac MRI 09/29/2022 1.   Normal LV size and function EF 59%   2.  No delayed gadolinium uptake scar of infarct   3.  Normal RV size and function RVEF 55% no evidence of RV dysplasia   4. Bicuspid Sievers type 0 AV with no significant AR and AVA by planimetry 3 cm2   5.  Normal ascending thoracic aorta 3.2 cm with no coarctation   6.  Mild MV thickening with mild appearing MR   7.  No pericardial effusion   8.  Normal parametric measure see values above  ZIO 07/16/2022 Patch Wear Time:  13 days and 12 hours (2023-07-28T09:39:19-0400 to 2023-08-10T21:50:13-0400)   Patient had a min HR of 55 bpm, max HR of 200 bpm, and avg HR of 83 bpm. Predominant underlying rhythm was Sinus Rhythm. 1 run of Ventricular Tachycardia occurred lasting 8 beats with a max rate of 200 bpm (avg 153 bpm). 1 run of Supraventricular  Tachycardia occurred lasting 5 beats with a max rate of 158 bpm (avg 133 bpm). Isolated SVEs were rare (<1.0%), SVE Couplets were rare (<1.0%), and no SVE Triplets were present. Isolated VEs were frequent (16.1%, [id number]), VE Couplets were rare (<1.0%,  115), and VE Triplets were rare (<1.0%, 4). Ventricular Bigeminy and Trigeminy were present.  Risk Assessment/Calculations:              Physical Exam:   VS:  BP 110/80 (BP Location: Right Arm, Patient Position: Sitting, Cuff Size: Normal)   Pulse 79   Ht 5\' 4"  (1.626 m)   Wt 158 lb (71.7 kg)   LMP 02/13/2012 (Exact Date)   BMI 27.12 kg/m    Wt Readings from Last 3 Encounters:  04/27/24 158 lb (71.7 kg)  03/01/24 160 lb 12.8 oz (72.9 kg)  12/12/23 157 lb (71.2 kg)    GEN: Well nourished, well developed in no acute distress NECK: No JVD; No carotid bruits CARDIAC: RRR, no murmurs, rubs, gallops RESPIRATORY:  Clear to auscultation without rales, wheezing or rhonchi  ABDOMEN: Soft, non-tender, non-distended EXTREMITIES:  No edema; No acute deformity      Assessment and Plan:  Frequent PVCs ZIO 06/2022 showing 16.1% PVC burden Quiescent and  well-controlled on propofol  Recently seen by EP on 02/2024 who felt PVCs were under control - No PVCs on exam - Avoid caffeine or EtOH excess.  Continue to work on stress/anxiety - Continue propranolol  ER 60 mg daily  Bicuspid aortic valve Echocardiogram 11/2021 showed bicuspid AV with no regurgitation or stenosis - Today she is without any chest pains, dyspnea, syncope.  She does endorse some mild fatigue and mild foot/ankle swelling - Plan to repeat echocardiogram for routine monitoring  Mild mitral valve regurgitation Echocardiogram 11/2021 showed mild MR - No chest pain, dyspnea, syncope - Repeat echocardiogram for routine monitoring as noted above  Coronary artery disease CT cardiac scoring 05/2023 with coronary calcium  score 30.3 (96th percentile) - Today patient is without any anginal symptoms, no indication for further ischemic evaluation at this time - Continue aspirin  81 mg daily, Vascepa 2 g daily, rosuvastatin  20 mg daily  Hypertension Blood pressure today is 110/80 and well-controlled - Maintain home BP log monitoring - Continue propranolol  ER 60 mg daily  Hyperlipidemia LDL 54, HDL 47, TG 177 on 96/2952 - LDL under excellent control and under goal of less than 70 - Continue rosuvastatin  10 mg daily and Vascepa 2 g daily     Dispo:  Return in about 1 year (around 04/27/2025).  Signed, Ava Boatman, NP

## 2024-05-09 DIAGNOSIS — R197 Diarrhea, unspecified: Secondary | ICD-10-CM | POA: Diagnosis not present

## 2024-05-09 DIAGNOSIS — K5730 Diverticulosis of large intestine without perforation or abscess without bleeding: Secondary | ICD-10-CM | POA: Diagnosis not present

## 2024-05-09 DIAGNOSIS — K648 Other hemorrhoids: Secondary | ICD-10-CM | POA: Diagnosis not present

## 2024-05-13 ENCOUNTER — Other Ambulatory Visit: Payer: Self-pay | Admitting: Cardiology

## 2024-05-17 DIAGNOSIS — Z01419 Encounter for gynecological examination (general) (routine) without abnormal findings: Secondary | ICD-10-CM | POA: Diagnosis not present

## 2024-06-04 DIAGNOSIS — Z91018 Allergy to other foods: Secondary | ICD-10-CM | POA: Diagnosis not present

## 2024-06-04 DIAGNOSIS — E785 Hyperlipidemia, unspecified: Secondary | ICD-10-CM | POA: Diagnosis not present

## 2024-06-04 DIAGNOSIS — R768 Other specified abnormal immunological findings in serum: Secondary | ICD-10-CM | POA: Diagnosis not present

## 2024-06-04 DIAGNOSIS — F419 Anxiety disorder, unspecified: Secondary | ICD-10-CM | POA: Diagnosis not present

## 2024-06-04 DIAGNOSIS — Z0000 Encounter for general adult medical examination without abnormal findings: Secondary | ICD-10-CM | POA: Diagnosis not present

## 2024-06-04 DIAGNOSIS — M79641 Pain in right hand: Secondary | ICD-10-CM | POA: Diagnosis not present

## 2024-06-04 DIAGNOSIS — M79642 Pain in left hand: Secondary | ICD-10-CM | POA: Diagnosis not present

## 2024-06-04 DIAGNOSIS — Q231 Congenital insufficiency of aortic valve: Secondary | ICD-10-CM | POA: Diagnosis not present

## 2024-06-04 DIAGNOSIS — G43909 Migraine, unspecified, not intractable, without status migrainosus: Secondary | ICD-10-CM | POA: Diagnosis not present

## 2024-06-08 ENCOUNTER — Ambulatory Visit: Payer: BC Managed Care – PPO | Admitting: Psychiatry

## 2024-06-08 DIAGNOSIS — R519 Headache, unspecified: Secondary | ICD-10-CM | POA: Diagnosis not present

## 2024-06-08 DIAGNOSIS — R051 Acute cough: Secondary | ICD-10-CM | POA: Diagnosis not present

## 2024-06-08 DIAGNOSIS — R509 Fever, unspecified: Secondary | ICD-10-CM | POA: Diagnosis not present

## 2024-06-08 DIAGNOSIS — R0981 Nasal congestion: Secondary | ICD-10-CM | POA: Diagnosis not present

## 2024-06-14 DIAGNOSIS — M25562 Pain in left knee: Secondary | ICD-10-CM | POA: Diagnosis not present

## 2024-06-14 DIAGNOSIS — M2550 Pain in unspecified joint: Secondary | ICD-10-CM | POA: Diagnosis not present

## 2024-06-14 DIAGNOSIS — M545 Low back pain, unspecified: Secondary | ICD-10-CM | POA: Diagnosis not present

## 2024-06-14 DIAGNOSIS — R768 Other specified abnormal immunological findings in serum: Secondary | ICD-10-CM | POA: Diagnosis not present

## 2024-06-14 DIAGNOSIS — M79641 Pain in right hand: Secondary | ICD-10-CM | POA: Diagnosis not present

## 2024-06-14 DIAGNOSIS — Z79899 Other long term (current) drug therapy: Secondary | ICD-10-CM | POA: Diagnosis not present

## 2024-06-14 DIAGNOSIS — M1990 Unspecified osteoarthritis, unspecified site: Secondary | ICD-10-CM | POA: Diagnosis not present

## 2024-06-14 DIAGNOSIS — M79671 Pain in right foot: Secondary | ICD-10-CM | POA: Diagnosis not present

## 2024-06-14 DIAGNOSIS — M549 Dorsalgia, unspecified: Secondary | ICD-10-CM | POA: Diagnosis not present

## 2024-06-14 DIAGNOSIS — M79642 Pain in left hand: Secondary | ICD-10-CM | POA: Diagnosis not present

## 2024-06-14 DIAGNOSIS — M79643 Pain in unspecified hand: Secondary | ICD-10-CM | POA: Diagnosis not present

## 2024-06-14 DIAGNOSIS — M25561 Pain in right knee: Secondary | ICD-10-CM | POA: Diagnosis not present

## 2024-06-14 DIAGNOSIS — M79672 Pain in left foot: Secondary | ICD-10-CM | POA: Diagnosis not present

## 2024-06-25 ENCOUNTER — Ambulatory Visit (HOSPITAL_COMMUNITY)
Admission: RE | Admit: 2024-06-25 | Discharge: 2024-06-25 | Disposition: A | Discharge status: Home / Self Care | Source: Ambulatory Visit | Attending: Cardiology | Admitting: Cardiology

## 2024-06-25 DIAGNOSIS — Q2381 Bicuspid aortic valve: Secondary | ICD-10-CM | POA: Diagnosis not present

## 2024-06-25 DIAGNOSIS — I2510 Atherosclerotic heart disease of native coronary artery without angina pectoris: Secondary | ICD-10-CM

## 2024-06-25 LAB — ECHOCARDIOGRAM COMPLETE
Area-P 1/2: 5.02 cm2
S' Lateral: 2.8 cm

## 2024-06-28 ENCOUNTER — Ambulatory Visit: Payer: Self-pay | Admitting: Emergency Medicine

## 2024-07-03 DIAGNOSIS — F410 Panic disorder [episodic paroxysmal anxiety] without agoraphobia: Secondary | ICD-10-CM | POA: Diagnosis not present

## 2024-07-03 DIAGNOSIS — F411 Generalized anxiety disorder: Secondary | ICD-10-CM | POA: Diagnosis not present

## 2024-07-03 DIAGNOSIS — F3340 Major depressive disorder, recurrent, in remission, unspecified: Secondary | ICD-10-CM | POA: Diagnosis not present

## 2024-07-12 ENCOUNTER — Other Ambulatory Visit: Payer: Self-pay | Admitting: Cardiology

## 2024-07-12 DIAGNOSIS — M0609 Rheumatoid arthritis without rheumatoid factor, multiple sites: Secondary | ICD-10-CM | POA: Diagnosis not present

## 2024-07-12 DIAGNOSIS — R768 Other specified abnormal immunological findings in serum: Secondary | ICD-10-CM | POA: Diagnosis not present

## 2024-07-12 DIAGNOSIS — M1990 Unspecified osteoarthritis, unspecified site: Secondary | ICD-10-CM | POA: Diagnosis not present

## 2024-07-12 DIAGNOSIS — M79643 Pain in unspecified hand: Secondary | ICD-10-CM | POA: Diagnosis not present

## 2024-07-29 ENCOUNTER — Ambulatory Visit (HOSPITAL_BASED_OUTPATIENT_CLINIC_OR_DEPARTMENT_OTHER): Admission: RE | Admit: 2024-07-29 | Discharge: 2024-07-29 | Disposition: A | Discharge status: Home / Self Care

## 2024-07-29 ENCOUNTER — Encounter (HOSPITAL_BASED_OUTPATIENT_CLINIC_OR_DEPARTMENT_OTHER): Payer: Self-pay

## 2024-07-29 VITALS — BP 130/90 | HR 78 | Temp 98.5°F | Resp 19 | Ht 64.0 in | Wt 157.0 lb

## 2024-07-29 DIAGNOSIS — L600 Ingrowing nail: Secondary | ICD-10-CM | POA: Diagnosis not present

## 2024-07-29 DIAGNOSIS — M79674 Pain in right toe(s): Secondary | ICD-10-CM | POA: Diagnosis not present

## 2024-07-29 HISTORY — DX: Rheumatoid arthritis, unspecified: M06.9

## 2024-07-29 MED ORDER — FLORAJEN DIGESTION PO CAPS: 1.0000 | ORAL_CAPSULE | Freq: Every day | ORAL | 0 refills | Status: AC

## 2024-07-29 MED ORDER — CLINDAMYCIN HCL 300 MG PO CAPS: 300.0000 mg | ORAL_CAPSULE | Freq: Three times a day (TID) | ORAL | 0 refills | Status: AC

## 2024-07-29 MED ORDER — MUPIROCIN 2 % EX OINT: 1.0000 | TOPICAL_OINTMENT | Freq: Two times a day (BID) | CUTANEOUS | 0 refills | Status: AC

## 2024-07-29 NOTE — ED Triage Notes (Signed)
 Pt states that has redness and swelling of her right great toe.x1 week Pt states that she tried to remove a ingrown toenail.

## 2024-07-29 NOTE — Discharge Instructions (Addendum)
 Ingrown right great toenail with secondary infection: I was able to remove the area of ingrown nail.  I was also able to remove some hypertrophic skin.  Area cleaned, antibiotic ointment applied and bandage.  Wound care daily is to clean with soapy fingers, rinse, pat dry, apply mupirocin  ointment and a simple bandage should be good starting tomorrow.  Could change the dressing today if it is soiled or wet.  Clindamycin  300 mg 1 pill 3 times daily with food for 7 days.  Encouraged use of an oral probiotic to prevent antibiotic related diarrhea.  Follow-up if symptoms do not improve, worsen or new symptoms occur.

## 2024-07-29 NOTE — ED Provider Notes (Signed)
 [facility]    CSN: [id number] Arrival date & time: 07/29/24  1022      History   Chief Complaint Chief Complaint  Patient presents with   Toe Injury    Infected ingrown toe. Currently taking methotrexate for RA. I cant get it to heal myself. - Entered by patient    HPI Madeline Everett is a 48 y.o. female.   Patient has rheumatoid arthritis and she is on methotrexate.  She also takes an aspirin  a day.  She has chronic ingrown toenails.  She normally can clip off the ingrown area and, monitor and keep it from getting infected.  Approximately a week ago (07/20/2024) ,she clipped off an ingrown area on the medial border of her right great toenail.  She feels like she missed part of the ingrown nail.  The area has gotten red and tender and she is needing something for both the ingrown nail and for infection of the toe.     Past Medical History:  Diagnosis Date   Acid reflux    Agatston coronary artery calcium  score less than 100    coronary Ca score 30 06/2023   Anxiety    Aortic valve disorder    Bicuspid aortic valve - stable by echo 11/2019   Asthma    Atypical chest pain    Chronic fatigue syndrome    Complication of anesthesia    nausea & vomiting and difficult time waking up   Compound nevus 01/2012   dysplastic, melanocytic atypia   Depression    crying spells for a year due to stress of  infertility   Deviated septum    Dr Merilee Kraft   Endometriosis    Family history of adverse reaction to anesthesia    sister-n/v   FH: thyroid  condition    Frequent PVCs    Gestational hypertension    H/O candidiasis    H/O constipation    H/O dyspareunia    H/O varicella    childhood   Headache(784.0)    migraine topomax   Heart murmur    congential bicuspid aortic valve, mitral valve prolapse   Herniated disc 2008   HTN (hypertension)    IBS (irritable bowel syndrome)    Infertility, female    Pelvic pain    Pneumonia    hx of   PONV (postoperative  nausea and vomiting)    needs Scop patch   Postpartum hypertension    not being treated now   Rheumatoid arthritis (HCC)    SVT (supraventricular tachycardia) (HCC)    UTI (lower urinary tract infection)    Yeast infection     Patient Active Problem List   Diagnosis Date Noted   Palpitations 09/07/2022   Bicuspid aortic valve 09/06/2022   Disorder of thyroid  gland 09/06/2022   History of cesarean section 09/06/2022   History of female infertility 09/06/2022   Near syncope 04/23/2020   Anxiety 12/26/2018   GAD (generalized anxiety disorder) 08/12/2018   HTN (hypertension) 02/21/2014   SVT (supraventricular tachycardia) (HCC) 02/21/2014   Postpartum hypertension    Pelvic pain 11/01/2013   DEPRESSION 05/30/2008   Mitral valve disorder 05/30/2008   ANAL FISSURE 05/30/2008   BICUSPID AORTIC VALVE 05/30/2008   DYSPHAGIA UNSPECIFIED 05/30/2008   MIGRAINE HEADACHE 05/29/2008   GERD 05/29/2008   CONSTIPATION 05/29/2008   IBS 05/29/2008   EPIGASTRIC PAIN 05/29/2008   Hx of endometriosis 12/24/2003    Past Surgical History:  Procedure Laterality Date  ABDOMINAL HYSTERECTOMY     BUNIONECTOMY  1998   CESAREAN SECTION  2010   CHOLECYSTECTOMY N/A 08/27/2019   Procedure: LAPAROSCOPIC CHOLECYSTECTOMY;  Surgeon: Curvin Deward MOULD, MD;  Location: [facility] OR;  Service: General;  Laterality: N/A;   COLONOSCOPY WITH PROPOFOL  N/A 08/20/2015   Procedure: COLONOSCOPY WITH PROPOFOL ;  Surgeon: Elsie Cree, MD;  Location: [facility];  Service: Endoscopy;  Laterality: N/A;   dermoid cyst  removed   2010   DILATION AND CURETTAGE OF UTERUS  12/31/10   ENTEROSCOPY N/A 08/20/2015   Procedure: ENTEROSCOPY;  Surgeon: Elsie Cree, MD;  Location: [facility];  Service: Endoscopy;  Laterality: N/A;   FOOT SURGERY     herniated disc surgery  2008   LAPAROSCOPIC ENDOMETRIOSIS FULGURATION     NASAL SEPTOPLASTY W/ TURBINOPLASTY Bilateral 09/12/2023   Procedure: NASAL SEPTOPLASTY WITH TURBINATE REDUCTION;   Surgeon: Jesus Oliphant, MD;  Location: [facility];  Service: ENT;  Laterality: Bilateral;   ROBOTIC ASSISTED TOTAL HYSTERECTOMY Bilateral 11/01/2013   Procedure: ROBOTIC ASSISTED TOTAL HYSTERECTOMY WITH BILATERAL SALPINOGECTOMY;  Surgeon: Nena DELENA App, MD;  Location: [facility];  Service: Gynecology;  Laterality: Bilateral;   TONSILLECTOMY  1993    OB History     Gravida  3   Para  3   Term  2   Preterm  1   AB      Living  3      SAB      IAB      Ectopic      Multiple      Live Births  3            Home Medications    Prior to Admission medications   Medication Sig Start Date End Date Taking? Authorizing Provider  albuterol (PROVENTIL HFA;VENTOLIN HFA) 108 (90 BASE) MCG/ACT inhaler Inhale 1-2 puffs into the lungs every 6 (six) hours as needed for wheezing or shortness of breath.   Yes [provider]  aspirin  EC 81 MG tablet Take 1 tablet (81 mg total) by mouth daily. Swallow whole. 07/06/23  Yes Turner, Wilbert SAUNDERS, MD  b complex vitamins capsule Take 1 capsule by mouth daily.   Yes [provider]  buPROPion  (WELLBUTRIN  SR) 150 MG 12 hr tablet Take 1 tablet (150 mg total) by mouth 2 (two) times daily. 09/09/23  Yes Franchot Harlene SQUIBB, PMHNP  cholecalciferol (VITAMIN D3) 25 MCG (1000 UNIT) tablet Take 1,000 Units by mouth daily.   Yes [provider]  clindamycin  (CLEOCIN ) 300 MG capsule Take 1 capsule (300 mg total) by mouth 3 (three) times daily after meals for 7 days. 07/29/24 08/05/24 Yes Ival Domino, FNP  desvenlafaxine  (PRISTIQ ) 100 MG 24 hr tablet Take 1 tablet by mouth daily.   Yes [provider]  docusate sodium (COLACE) 100 MG capsule Take 100 mg by mouth daily as needed for mild constipation.   Yes [provider]  famotidine (PEPCID) 20 MG tablet Take 20 mg by mouth as needed for heartburn or indigestion.   Yes [provider]  FLOVENT HFA 110 MCG/ACT inhaler Inhale 2 puffs into the lungs 2  (two) times daily. 07/07/22  Yes [provider]  folic acid (FOLVITE) 1 MG tablet Take 1 mg by mouth daily. 07/12/24  Yes [provider]  LORazepam (ATIVAN) 0.5 MG tablet TAKE 1/2-1 TABLET DAILY AS NEEDED FOR ANXIETY   Yes [provider]  Magnesium 250 MG TABS Take 250 mg by mouth every evening.  Yes [provider]  methotrexate (RHEUMATREX) 2.5 MG tablet Take 10 mg by mouth once a week. 07/12/24  Yes [provider]  mupirocin  ointment (BACTROBAN ) 2 % Apply 1 Application topically 2 (two) times daily. 07/29/24  Yes Ival Domino, FNP  ondansetron  (ZOFRAN -ODT) 4 MG disintegrating tablet Take 4 mg by mouth daily as needed. 07/22/22  Yes [provider]  Probiotic Product (FLORAJEN DIGESTION) CAPS Take 1 capsule by mouth daily. 07/29/24  Yes Ival Domino, FNP  propranolol  ER (INDERAL  LA) 60 MG 24 hr capsule TAKE 1 CAPSULE BY MOUTH EVERY DAY 05/15/24  Yes Turner, Wilbert SAUNDERS, MD  rizatriptan (MAXALT) 10 MG tablet Take 10 mg by mouth as needed for migraine. May repeat in 2 hours if needed   Yes [provider]  rosuvastatin  (CRESTOR ) 20 MG tablet TAKE 1 TABLET BY MOUTH EVERY DAY 07/13/24  Yes Turner, Wilbert SAUNDERS, MD  saccharomyces boulardii (FLORASTOR) 250 MG capsule Take 250 mg by mouth. 03/16/16  Yes [provider]  topiramate (TOPAMAX) 100 MG tablet Take 200 mg by mouth daily.   Yes [provider]  VASCEPA 1 g capsule Take 2 g by mouth 2 (two) times daily. 07/17/23  Yes [provider]    Family History Family History  Problem Relation Age of Onset   Diabetes Mother    Other Father        medical history unknown   Heart disease Maternal Uncle    Hypertension Maternal Grandmother    Diabetes Maternal Grandmother    Heart disease Maternal Grandmother    ADD / ADHD Son     Social History Social History   Tobacco Use   Smoking status: Never   Smokeless tobacco: Never  Vaping Use   Vaping status: Never Used   Substance Use Topics   Alcohol use: Not Currently    Comment: occasionally   Drug use: No     Allergies   Ciprofloxacin , Penicillins, Amoxicillin, Codeine, Fluoxetine , Guaifenesin & derivatives, Hydrocodone -acetaminophen , Iodine, Lactose intolerance (gi), Lactulose, Paxil [paroxetine hcl], and Percocet [oxycodone -acetaminophen ]   Review of Systems Review of Systems  Constitutional:  Negative for fever.  Respiratory:  Negative for cough.   Cardiovascular:  Negative for chest pain.  Gastrointestinal:  Negative for abdominal pain, constipation, diarrhea, nausea and vomiting.  Musculoskeletal:  Negative for arthralgias and back pain.  Skin:  Positive for wound (Ingrown medial border of right great toenail with erythema). Negative for color change and rash.  Neurological:  Negative for syncope.  All other systems reviewed and are negative.    Physical Exam Triage Vital Signs ED Triage Vitals  Encounter Vitals Group     BP 07/29/24 1047 (!) 130/90     Girls Systolic BP Percentile --      Girls Diastolic BP Percentile --      Boys Systolic BP Percentile --      Boys Diastolic BP Percentile --      Pulse Rate 07/29/24 1047 78     Resp 07/29/24 1047 19     Temp 07/29/24 1047 98.5 F (36.9 C)     Temp Source 07/29/24 1047 Oral     SpO2 07/29/24 1047 98 %     Weight 07/29/24 1045 157 lb (71.2 kg)     Height 07/29/24 1045 5' 4 (1.626 m)     Head Circumference --      Peak Flow --      Pain Score 07/29/24 1045 5     Pain  Loc --      Pain Education --      Exclude from Growth Chart --    No data found.  Updated Vital Signs BP (!) 130/90 (BP Location: Right Arm)   Pulse 78   Temp 98.5 F (36.9 C) (Oral)   Resp 19   Ht 5' 4 (1.626 m)   Wt 157 lb (71.2 kg)   LMP 02/13/2012 (Exact Date)   SpO2 98%   BMI 26.95 kg/m   Visual Acuity Right Eye Distance:   Left Eye Distance:   Bilateral Distance:    Right Eye Near:   Left Eye Near:    Bilateral Near:     Physical  Exam Vitals and nursing note reviewed.  Constitutional:      General: She is not in acute distress.    Appearance: She is well-developed. She is not ill-appearing or toxic-appearing.  HENT:     Head: Normocephalic and atraumatic.     Right Ear: External ear normal.     Left Ear: External ear normal.     Nose: Nose normal.     Mouth/Throat:     Lips: Pink.     Mouth: Mucous membranes are moist.  Eyes:     Conjunctiva/sclera: Conjunctivae normal.     Pupils: Pupils are equal, round, and reactive to light.  Cardiovascular:     Rate and Rhythm: Normal rate and regular rhythm.     Heart sounds: S1 normal and S2 normal. No murmur heard. Pulmonary:     Effort: Pulmonary effort is normal. No respiratory distress.     Breath sounds: Normal breath sounds. No decreased breath sounds, wheezing, rhonchi or rales.  Musculoskeletal:        General: No swelling.  Skin:    General: Skin is warm and dry.     Capillary Refill: Capillary refill takes less than 2 seconds.     Findings: Abscess (See comments for more information) present. No rash.     Comments: Nails are normal in thickness and normal in color.  Right great toenail with erythema on the medial border of the nail and crusted area with blood and some purulent crusting.  It appears that this is an ingrown nail with infection.  See photo for more information.  Neurological:     Mental Status: She is alert and oriented to person, place, and time.  Psychiatric:        Mood and Affect: Mood normal.      UC Treatments / Results  Labs (all labs ordered are listed, but only abnormal results are displayed) Labs Reviewed - No data to display  EKG   Radiology No results found.  Procedures Excise Ingrown Toenail  Date/Time: 07/29/2024 12:43 PM  Performed by: Ival Domino, FNP Authorized by: Ival Domino, FNP   Consent:    Consent obtained:  Verbal   Consent given by:  Patient   Risks, benefits, and alternatives were discussed:  yes     Risks discussed:  Bleeding, incomplete removal, infection, pain and permanent nail deformity   Alternatives discussed:  Referral, observation, alternative treatment, delayed treatment and no treatment Universal protocol:    Procedure explained and questions answered to patient or proxy's satisfaction: yes     Immediately prior to procedure, a time out was called: yes     Patient identity confirmed:  Verbally with patient Pre-procedure details:    Skin preparation:  Chlorhexidine    Preparation: Patient was prepped and draped in the usual sterile  fashion   Procedure details:    Location:  Foot   Foot location:  R big toe Anesthesia:    Anesthesia method:  Local infiltration   Local anesthetic:  Lidocaine  1% w/o epi Nail Removal:    Nail removed:  Partial   Nail side:  Medial   Nail bed repaired: no     Removed nail replaced and anchored: no   Trephination:    Subungual hematoma drained: no   Ingrown nail:    Wedge excision of skin: yes     Nail matrix removed or ablated:  None Post-procedure details:    Dressing:  4x4 sterile gauze and antibiotic ointment   Procedure completion:  Tolerated well, no immediate complications  (including critical care time)  Medications Ordered in UC Medications - No data to display  Initial Impression / Assessment and Plan / UC Course  I have reviewed the triage vital signs and the nursing notes.  Pertinent labs & imaging results that were available during my care of the patient were reviewed by me and considered in my medical decision making (see chart for details).  Plan of Care: Ingrown toenail with infection: See discharge instructions for specific patient education and wound care instructions.  Clindamycin  300 mg 3 times daily with food for 7 days.  Mupirocin  ointment at least once daily but could use twice daily if needed.  Encouraged use of Florajen probiotic to reduce risk of antibiotic related diarrhea.  Follow-up if symptoms do  not improve, worsen or new symptoms occur  I reviewed the plan of care with the patient and/or the patient's guardian.  The patient and/or guardian had time to ask questions and acknowledged that the questions were answered.  I provided instruction on symptoms or reasons to return here or to go to an ER, if symptoms/condition did not improve, worsened or if new symptoms occurred.  Final Clinical Impressions(s) / UC Diagnoses   Final diagnoses:  Ingrown toenail of right foot with infection  Pain around toenail, right foot     Discharge Instructions      Ingrown right great toenail with secondary infection: I was able to remove the area of ingrown nail.  I was also able to remove some hypertrophic skin.  Area cleaned, antibiotic ointment applied and bandage.  Wound care daily is to clean with soapy fingers, rinse, pat dry, apply mupirocin  ointment and a simple bandage should be good starting tomorrow.  Could change the dressing today if it is soiled or wet.  Clindamycin  300 mg 1 pill 3 times daily with food for 7 days.  Encouraged use of an oral probiotic to prevent antibiotic related diarrhea.  Follow-up if symptoms do not improve, worsen or new symptoms occur.     ED Prescriptions     Medication Sig Dispense Auth. Provider   mupirocin  ointment (BACTROBAN ) 2 % Apply 1 Application topically 2 (two) times daily. 22 g Ival Domino, FNP   clindamycin  (CLEOCIN ) 300 MG capsule Take 1 capsule (300 mg total) by mouth 3 (three) times daily after meals for 7 days. 21 capsule Ival Domino, FNP   Probiotic Product [facility] DIGESTION) CAPS Take 1 capsule by mouth daily. 30 capsule Ival Domino, FNP      PDMP not reviewed this encounter.   Ival Domino, FNP 07/29/24 [phone]

## 2024-08-22 DIAGNOSIS — Z79899 Other long term (current) drug therapy: Secondary | ICD-10-CM | POA: Diagnosis not present

## 2024-08-22 DIAGNOSIS — R768 Other specified abnormal immunological findings in serum: Secondary | ICD-10-CM | POA: Diagnosis not present

## 2024-08-22 DIAGNOSIS — M1990 Unspecified osteoarthritis, unspecified site: Secondary | ICD-10-CM | POA: Diagnosis not present

## 2024-08-22 DIAGNOSIS — M0609 Rheumatoid arthritis without rheumatoid factor, multiple sites: Secondary | ICD-10-CM | POA: Diagnosis not present

## 2024-09-07 DIAGNOSIS — L719 Rosacea, unspecified: Secondary | ICD-10-CM | POA: Diagnosis not present

## 2024-10-04 DIAGNOSIS — M79643 Pain in unspecified hand: Secondary | ICD-10-CM | POA: Diagnosis not present

## 2024-10-15 DIAGNOSIS — L821 Other seborrheic keratosis: Secondary | ICD-10-CM | POA: Diagnosis not present

## 2024-10-15 DIAGNOSIS — D485 Neoplasm of uncertain behavior of skin: Secondary | ICD-10-CM | POA: Diagnosis not present

## 2024-10-15 DIAGNOSIS — D225 Melanocytic nevi of trunk: Secondary | ICD-10-CM | POA: Diagnosis not present

## 2024-10-15 DIAGNOSIS — L718 Other rosacea: Secondary | ICD-10-CM | POA: Diagnosis not present

## 2024-10-16 DIAGNOSIS — M2550 Pain in unspecified joint: Secondary | ICD-10-CM | POA: Diagnosis not present

## 2024-10-16 DIAGNOSIS — M1990 Unspecified osteoarthritis, unspecified site: Secondary | ICD-10-CM | POA: Diagnosis not present

## 2024-10-16 DIAGNOSIS — Z79899 Other long term (current) drug therapy: Secondary | ICD-10-CM | POA: Diagnosis not present

## 2024-10-16 DIAGNOSIS — M0609 Rheumatoid arthritis without rheumatoid factor, multiple sites: Secondary | ICD-10-CM | POA: Diagnosis not present

## 2024-10-17 DIAGNOSIS — L719 Rosacea, unspecified: Secondary | ICD-10-CM | POA: Diagnosis not present

## 2024-10-17 DIAGNOSIS — H04129 Dry eye syndrome of unspecified lacrimal gland: Secondary | ICD-10-CM | POA: Diagnosis not present

## 2024-10-17 DIAGNOSIS — H02889 Meibomian gland dysfunction of unspecified eye, unspecified eyelid: Secondary | ICD-10-CM | POA: Diagnosis not present

## 2024-11-14 DIAGNOSIS — Z79899 Other long term (current) drug therapy: Secondary | ICD-10-CM | POA: Diagnosis not present

## 2024-12-03 ENCOUNTER — Encounter (HOSPITAL_BASED_OUTPATIENT_CLINIC_OR_DEPARTMENT_OTHER): Payer: Self-pay

## 2024-12-03 ENCOUNTER — Other Ambulatory Visit: Payer: Self-pay

## 2024-12-03 ENCOUNTER — Emergency Department (HOSPITAL_BASED_OUTPATIENT_CLINIC_OR_DEPARTMENT_OTHER)
Admission: EM | Admit: 2024-12-03 | Discharge: 2024-12-03 | Disposition: A | Discharge status: Home / Self Care | Source: Ambulatory Visit

## 2024-12-03 ENCOUNTER — Emergency Department (HOSPITAL_BASED_OUTPATIENT_CLINIC_OR_DEPARTMENT_OTHER)

## 2024-12-03 DIAGNOSIS — Z7982 Long term (current) use of aspirin: Secondary | ICD-10-CM | POA: Diagnosis not present

## 2024-12-03 DIAGNOSIS — R0602 Shortness of breath: Secondary | ICD-10-CM | POA: Diagnosis not present

## 2024-12-03 DIAGNOSIS — M7989 Other specified soft tissue disorders: Secondary | ICD-10-CM | POA: Diagnosis not present

## 2024-12-03 DIAGNOSIS — R110 Nausea: Secondary | ICD-10-CM | POA: Insufficient documentation

## 2024-12-03 DIAGNOSIS — R079 Chest pain, unspecified: Secondary | ICD-10-CM | POA: Insufficient documentation

## 2024-12-03 DIAGNOSIS — R5383 Other fatigue: Secondary | ICD-10-CM | POA: Insufficient documentation

## 2024-12-03 LAB — CBC
HCT: 45.2 % (ref 36.0–46.0)
Hemoglobin: 15 g/dL (ref 12.0–15.0)
MCH: 29.8 pg (ref 26.0–34.0)
MCHC: 33.2 g/dL (ref 30.0–36.0)
MCV: 89.7 fL (ref 80.0–100.0)
Platelets: 256 K/uL (ref 150–400)
RBC: 5.04 MIL/uL (ref 3.87–5.11)
RDW: 13 % (ref 11.5–15.5)
WBC: 10.9 K/uL — ABNORMAL HIGH (ref 4.0–10.5)
nRBC: 0 % (ref 0.0–0.2)

## 2024-12-03 LAB — BASIC METABOLIC PANEL WITH GFR
Anion gap: 11 (ref 5–15)
BUN: 11 mg/dL (ref 6–20)
CO2: 23 mmol/L (ref 22–32)
Calcium: 10.2 mg/dL (ref 8.9–10.3)
Chloride: 105 mmol/L (ref 98–111)
Creatinine, Ser: 0.89 mg/dL (ref 0.44–1.00)
GFR, Estimated: >60 mL/min
Glucose, Bld: 118 mg/dL — ABNORMAL HIGH (ref 70–99)
Potassium: 4.1 mmol/L (ref 3.5–5.1)
Sodium: 138 mmol/L (ref 135–145)

## 2024-12-03 LAB — TROPONIN T, HIGH SENSITIVITY
Troponin T High Sensitivity: <15 ng/L (ref 0–19)
Troponin T High Sensitivity: <15 ng/L (ref 0–19)

## 2024-12-03 MED ORDER — KETOROLAC TROMETHAMINE 15 MG/ML IJ SOLN
15.0000 mg | Freq: Once | INTRAMUSCULAR | Status: AC
  Administered 2024-12-03: 15 mg via INTRAVENOUS
  Filled 2024-12-03: qty 1

## 2024-12-03 NOTE — ED Triage Notes (Signed)
 Patient here POV from Home.  Notes CP (mostly central) that began 2 days ago. Worsened since and radiates to left chest and left shoulder now. Constant mostly but waxes/wanes in intensity. Some SOB. Some Nausea now. No Emesis.  NAD noted during triage. A&Ox4. GCS 15. Ambulatory.

## 2024-12-03 NOTE — Discharge Instructions (Addendum)
 Please follow-up with your cardiologist and your primary doctor soon as possible.  Take Tylenol  alternating with ibuprofen  for your pain.  Return for fevers, chills, change in symptoms, lightheadedness, passout, difficulty breathing or any new or worsening symptoms that are concerning to you.

## 2024-12-03 NOTE — ED Provider Notes (Signed)
 " [place] [facility] AT [facility] Provider Note   CSN: [id number] Arrival date & time: 12/03/24  9347     Patient presents with: Chest Pain   Madeline Everett is a 49 y.o. female.   This is a 49 year old female presenting emergency department for evaluation of chest pain.  Reports having URI symptoms congestion, cough for the past 4 to 5 days, and for the past 2 days has had some central chest pain, varies in intensity.  Worsened somewhat with exertion, but patient notes it is way worse when she is lying flat.  Endorses some nausea and shortness of breath.  Does have PVCs takes propranolol .  Noted more palpitations a few days ago after taking some cold/flu medicine.   Chest Pain      Prior to Admission medications  Medication Sig Start Date End Date Taking? Authorizing Provider  albuterol (PROVENTIL HFA;VENTOLIN HFA) 108 (90 BASE) MCG/ACT inhaler Inhale 1-2 puffs into the lungs every 6 (six) hours as needed for wheezing or shortness of breath.    [provider]  aspirin  EC 81 MG tablet Take 1 tablet (81 mg total) by mouth daily. Swallow whole. 07/06/23   Shlomo Wilbert SAUNDERS, MD  b complex vitamins capsule Take 1 capsule by mouth daily.    [provider]  buPROPion  (WELLBUTRIN  SR) 150 MG 12 hr tablet Take 1 tablet (150 mg total) by mouth 2 (two) times daily. 09/09/23   Franchot Harlene SQUIBB, PMHNP  cholecalciferol (VITAMIN D3) 25 MCG (1000 UNIT) tablet Take 1,000 Units by mouth daily.    [provider]  desvenlafaxine  (PRISTIQ ) 100 MG 24 hr tablet Take 1 tablet by mouth daily.    [provider]  docusate sodium (COLACE) 100 MG capsule Take 100 mg by mouth daily as needed for mild constipation.    [provider]  famotidine (PEPCID) 20 MG tablet Take 20 mg by mouth as needed for heartburn or indigestion.    [provider]  FLOVENT HFA 110 MCG/ACT inhaler Inhale 2 puffs into the lungs 2 (two) times daily. 07/07/22    [provider]  folic acid (FOLVITE) 1 MG tablet Take 1 mg by mouth daily. 07/12/24   [provider]  LORazepam (ATIVAN) 0.5 MG tablet TAKE 1/2-1 TABLET DAILY AS NEEDED FOR ANXIETY    [provider]  Magnesium 250 MG TABS Take 250 mg by mouth every evening.     [provider]  methotrexate (RHEUMATREX) 2.5 MG tablet Take 10 mg by mouth once a week. 07/12/24   [provider]  mupirocin  ointment (BACTROBAN ) 2 % Apply 1 Application topically 2 (two) times daily. 07/29/24   Ival Domino, FNP  ondansetron  (ZOFRAN -ODT) 4 MG disintegrating tablet Take 4 mg by mouth daily as needed. 07/22/22   [provider]  Probiotic Product [facility] DIGESTION) CAPS Take 1 capsule by mouth daily. 07/29/24   Ival Domino, FNP  propranolol  ER (INDERAL  LA) 60 MG 24 hr capsule TAKE 1 CAPSULE BY MOUTH EVERY DAY 05/15/24   Shlomo Wilbert SAUNDERS, MD  rizatriptan (MAXALT) 10 MG tablet Take 10 mg by mouth as needed for migraine. May repeat in 2 hours if needed    [provider]  rosuvastatin  (CRESTOR ) 20 MG tablet TAKE 1 TABLET BY MOUTH EVERY DAY 07/13/24   Shlomo Wilbert SAUNDERS, MD  saccharomyces boulardii (FLORASTOR) 250 MG capsule Take 250 mg by mouth. 03/16/16   [provider]  topiramate (TOPAMAX) 100 MG tablet Take  200 mg by mouth daily.    [provider]  VASCEPA 1 g capsule Take 2 g by mouth 2 (two) times daily. 07/17/23   [provider]    Allergies: Ciprofloxacin , Penicillins, Amoxicillin, Codeine, Fluoxetine , Guaifenesin & derivatives, Hydrocodone -acetaminophen , Iodine, Lactose intolerance (gi), Lactulose, Paxil [paroxetine hcl], and Percocet [oxycodone -acetaminophen ]    Review of Systems  Cardiovascular:  Positive for chest pain.    Updated Vital Signs BP 113/75   Pulse 73   Temp 98.3 F (36.8 C) (Oral)   Resp 14   Ht 5' 4 (1.626 m)   Wt 72.1 kg   LMP 02/13/2012   SpO2 96%   BMI 27.29 kg/m   Physical Exam Vitals and  nursing note reviewed.  Constitutional:      General: She is not in acute distress.    Appearance: She is not toxic-appearing.  HENT:     Head: Normocephalic.  Cardiovascular:     Rate and Rhythm: Normal rate and regular rhythm.     Pulses:          Radial pulses are 2+ on the right side and 2+ on the left side.     Heart sounds: Normal heart sounds.     No friction rub.  Pulmonary:     Effort: Pulmonary effort is normal.     Breath sounds: Normal breath sounds. No decreased breath sounds, wheezing, rhonchi or rales.  Abdominal:     Palpations: Abdomen is soft.  Musculoskeletal:     Right lower leg: No edema.     Left lower leg: No edema.  Skin:    General: Skin is warm.     Capillary Refill: Capillary refill takes less than 2 seconds.  Neurological:     General: No focal deficit present.     Mental Status: She is alert.  Psychiatric:        Mood and Affect: Mood normal.        Behavior: Behavior normal.     (all labs ordered are listed, but only abnormal results are displayed) Labs Reviewed  BASIC METABOLIC PANEL WITH GFR - Abnormal; Notable for the following components:      Result Value   Glucose, Bld 118 (*)    All other components within normal limits  CBC - Abnormal; Notable for the following components:   WBC 10.9 (*)    All other components within normal limits  TROPONIN T, HIGH SENSITIVITY  TROPONIN T, HIGH SENSITIVITY    EKG: None  Radiology: DG Chest 2 View Result Date: 12/03/2024 CLINICAL DATA:  Chest pain EXAM: CHEST - 2 VIEW COMPARISON:  12/12/2023 FINDINGS: The lungs are clear without focal pneumonia, edema, pneumothorax or pleural effusion. The cardiopericardial silhouette is within normal limits for size. No acute bony abnormality. Telemetry leads overlie the chest. IMPRESSION: No active cardiopulmonary disease. Electronically Signed   By: Camellia Candle M.D.   On: 12/03/2024 07:44     Procedures   Medications Ordered in the ED  ketorolac   (TORADOL ) 15 MG/ML injection 15 mg (15 mg Intravenous Given 12/03/24 0750)    Clinical Course as of 12/03/24 1012  Mon Dec 03, 2024  0711 Echo 06/25/24 per my chart review: 1. Left ventricular ejection fraction, by estimation, is 60 to 65%. Left  ventricular ejection fraction by 3D volume is 54 %. The left ventricle has  normal function. The left ventricle has no regional wall motion  abnormalities. Left ventricular diastolic   parameters were normal.   2.  Right ventricular systolic function is normal. The right ventricular  size is normal. Tricuspid regurgitation signal is inadequate for assessing  PA pressure.   3. The mitral valve is normal in structure. Trivial mitral valve  regurgitation. No evidence of mitral stenosis.   4. The aortic valve has an indeterminant number of cusps. Aortic valve  regurgitation is not visualized. No aortic stenosis is present.   5. No evidence of aortic coarctation.   6. The inferior vena cava is normal in size with greater than 50%  respiratory variability, suggesting right atrial pressure of 3 mmHg.   [TY]  [id number] Coronary calcium  score in 2024:IMPRESSION: Coronary calcium  score of 30.3. This was 4 percentile for age-, race-, and sex-matched controls.  [TY]  0713 Heart monitor worn in 2023 showed: Predominant rhythm is normal sinus rhythm with average heart rate 83bpm and ranged from 55 to 200bpm.   Frequent PVCs, bigeminal and trigeminal PVCs, vetricular couplets, triplets and nonsustained ventricular tachycardia up to 8 beats at 200bpm.  PVC load 16.1%.     Patch Wear Time:  13 days and 12 hours (2023-07-28T09:39:19-0400 to 2023-08-10T21:50:13-0400)    [TY]  0715 Last saw cardiology in may and per their note: Frequent PVCs ZIO 06/2022 showing 16.1% PVC burden Quiescent and well-controlled on propofol  Recently seen by EP on 02/2024 who felt PVCs were under control - No PVCs on exam - Avoid caffeine or EtOH excess.  Continue to work on  stress/anxiety - Continue propranolol  ER 60 mg daily   Bicuspid aortic valve Echocardiogram 11/2021 showed bicuspid AV with no regurgitation or stenosis - Today she is without any chest pains, dyspnea, syncope.  She does endorse some mild fatigue and mild foot/ankle swelling - Plan to repeat echocardiogram for routine monitoring   Mild mitral valve regurgitation Echocardiogram 11/2021 showed mild MR - No chest pain, dyspnea, syncope - Repeat echocardiogram for routine monitoring as noted above   Coronary artery disease CT cardiac scoring 05/2023 with coronary calcium  score 30.3 (96th percentile) - Today patient is without any anginal symptoms, no indication for further ischemic evaluation at this time - Continue aspirin  81 mg daily, Vascepa 2 g daily, rosuvastatin  20 mg daily  [TY]  0740 Troponin T High Sensitivity: <15 [TY]  0741 Basic metabolic panel(!) No significant electrolyte abnormalities.  Normal kidney function [TY]  0741 CBC(!) No significant abnormalities, minor leukocytosis consistent with her likely viral URI.  Hemoglobin stable, no anemia [TY]  0750 DG Chest 2 View IMPRESSION: No active cardiopulmonary disease.   Electronically Signed   By: Camellia Candle M.D.   On: 12/03/2024 07:44   [TY]  9082 Patient feeling improved after Toradol  [TY]  1008 Troponin T High Sensitivity: <15 [TY]  1009 Patient feeling improved.  PVC burden reduced on the monitor on reevaluation.  Has remained hemodynamically stable.  Discussed supportive medications with Tylenol , ibuprofen  for pleuritis, possible early pericarditis and close follow-up with her primary doctor and cardiologist.  Patient agreeable to plan.  Discharged in stable condition. [TY]    Clinical Course User Index [TY] Neysa Caron PARAS, DO                                 Medical Decision Making This is a 49 year old female presenting emergency department for chest pain concurrent with URI symptoms.  Symptoms x 2 to 3 days.   She is afebrile nontachycardic slightly hypertensive.  Maintaining oxygen  saturation on room air.  She does not appear to be in distress.  Bedside echo performed by me with subjective preserved EF.  No pericardial effusion/tamponade.  No RV dilation.  Her EKG is independently interpreted by me appears to be sinus rhythm with frequent PVCs.  No ischemic changes.  No changes consistent with pericarditis either.  Does have some cardiac history as noted in the ED course.  Do not think she is having PE low risk based on Wells criteria, PERC negative.  Symptoms coincide with her URI may be pleuritis, early pericarditis?  Will get screening labs, chest x-ray and troponins to evaluate for cardiac component. Will give toradol ; See ED course for further MDM and final disposition.  Amount and/or Complexity of Data Reviewed Labs: ordered. Decision-making details documented in ED Course. Radiology: ordered. Decision-making details documented in ED Course.  Risk Prescription drug management.      Final diagnoses:  Chest pain, unspecified type    ED Discharge Orders     None          Neysa Caron PARAS, DO 12/03/24 1012  "
